# Patient Record
Sex: Female | Born: 1940 | Race: Black or African American | Hispanic: No | Marital: Married | Smoking: Former smoker
Health system: Southern US, Community
[De-identification: ages and names within clinical notes are randomized; demographics above are authoritative.]

## PROBLEM LIST (undated history)

## (undated) DIAGNOSIS — C799 Secondary malignant neoplasm of unspecified site: Secondary | ICD-10-CM

## (undated) DIAGNOSIS — N179 Acute kidney failure, unspecified: Secondary | ICD-10-CM

## (undated) DIAGNOSIS — I10 Essential (primary) hypertension: Secondary | ICD-10-CM

## (undated) DIAGNOSIS — C801 Malignant (primary) neoplasm, unspecified: Secondary | ICD-10-CM

## (undated) DIAGNOSIS — R601 Generalized edema: Secondary | ICD-10-CM

## (undated) DIAGNOSIS — E785 Hyperlipidemia, unspecified: Secondary | ICD-10-CM

## (undated) DIAGNOSIS — E119 Type 2 diabetes mellitus without complications: Secondary | ICD-10-CM

## (undated) HISTORY — PX: ABDOMINAL HYSTERECTOMY: SHX81

---

## 2015-08-06 DIAGNOSIS — E119 Type 2 diabetes mellitus without complications: Secondary | ICD-10-CM | POA: Diagnosis not present

## 2015-08-06 DIAGNOSIS — I10 Essential (primary) hypertension: Secondary | ICD-10-CM | POA: Diagnosis not present

## 2015-08-09 DIAGNOSIS — Z23 Encounter for immunization: Secondary | ICD-10-CM | POA: Diagnosis not present

## 2015-08-09 DIAGNOSIS — E119 Type 2 diabetes mellitus without complications: Secondary | ICD-10-CM | POA: Diagnosis not present

## 2015-08-09 DIAGNOSIS — I10 Essential (primary) hypertension: Secondary | ICD-10-CM | POA: Diagnosis not present

## 2015-12-04 DIAGNOSIS — E119 Type 2 diabetes mellitus without complications: Secondary | ICD-10-CM | POA: Diagnosis not present

## 2015-12-04 DIAGNOSIS — E785 Hyperlipidemia, unspecified: Secondary | ICD-10-CM | POA: Diagnosis not present

## 2015-12-04 DIAGNOSIS — I10 Essential (primary) hypertension: Secondary | ICD-10-CM | POA: Diagnosis not present

## 2015-12-10 DIAGNOSIS — E1169 Type 2 diabetes mellitus with other specified complication: Secondary | ICD-10-CM | POA: Diagnosis not present

## 2015-12-10 DIAGNOSIS — I10 Essential (primary) hypertension: Secondary | ICD-10-CM | POA: Diagnosis not present

## 2015-12-10 DIAGNOSIS — Z6838 Body mass index (BMI) 38.0-38.9, adult: Secondary | ICD-10-CM | POA: Diagnosis not present

## 2016-01-08 DIAGNOSIS — E1169 Type 2 diabetes mellitus with other specified complication: Secondary | ICD-10-CM | POA: Diagnosis not present

## 2016-01-08 DIAGNOSIS — I10 Essential (primary) hypertension: Secondary | ICD-10-CM | POA: Diagnosis not present

## 2016-01-08 DIAGNOSIS — Z780 Asymptomatic menopausal state: Secondary | ICD-10-CM | POA: Diagnosis not present

## 2016-01-15 DIAGNOSIS — Z1211 Encounter for screening for malignant neoplasm of colon: Secondary | ICD-10-CM | POA: Diagnosis not present

## 2016-01-17 DIAGNOSIS — E119 Type 2 diabetes mellitus without complications: Secondary | ICD-10-CM | POA: Diagnosis not present

## 2016-04-23 DIAGNOSIS — Z23 Encounter for immunization: Secondary | ICD-10-CM | POA: Diagnosis not present

## 2016-05-13 DIAGNOSIS — E785 Hyperlipidemia, unspecified: Secondary | ICD-10-CM | POA: Diagnosis not present

## 2016-05-13 DIAGNOSIS — I10 Essential (primary) hypertension: Secondary | ICD-10-CM | POA: Diagnosis not present

## 2016-05-13 DIAGNOSIS — E119 Type 2 diabetes mellitus without complications: Secondary | ICD-10-CM | POA: Diagnosis not present

## 2016-05-19 DIAGNOSIS — I10 Essential (primary) hypertension: Secondary | ICD-10-CM | POA: Diagnosis not present

## 2016-05-19 DIAGNOSIS — E119 Type 2 diabetes mellitus without complications: Secondary | ICD-10-CM | POA: Diagnosis not present

## 2016-08-14 ENCOUNTER — Observation Stay (HOSPITAL_COMMUNITY)
Admission: EM | Admit: 2016-08-14 | Discharge: 2016-08-16 | Disposition: A | Discharge status: Home / Self Care | Payer: Medicare HMO | Attending: Internal Medicine | Admitting: Internal Medicine

## 2016-08-14 ENCOUNTER — Encounter (HOSPITAL_COMMUNITY): Payer: Self-pay | Admitting: Emergency Medicine

## 2016-08-14 ENCOUNTER — Emergency Department (HOSPITAL_COMMUNITY): Payer: Medicare HMO

## 2016-08-14 DIAGNOSIS — F17200 Nicotine dependence, unspecified, uncomplicated: Secondary | ICD-10-CM | POA: Diagnosis not present

## 2016-08-14 DIAGNOSIS — R42 Dizziness and giddiness: Secondary | ICD-10-CM

## 2016-08-14 DIAGNOSIS — R404 Transient alteration of awareness: Secondary | ICD-10-CM | POA: Diagnosis not present

## 2016-08-14 DIAGNOSIS — E785 Hyperlipidemia, unspecified: Secondary | ICD-10-CM | POA: Insufficient documentation

## 2016-08-14 DIAGNOSIS — Z79899 Other long term (current) drug therapy: Secondary | ICD-10-CM | POA: Diagnosis not present

## 2016-08-14 DIAGNOSIS — R55 Syncope and collapse: Principal | ICD-10-CM | POA: Insufficient documentation

## 2016-08-14 DIAGNOSIS — K5900 Constipation, unspecified: Secondary | ICD-10-CM | POA: Diagnosis not present

## 2016-08-14 DIAGNOSIS — N179 Acute kidney failure, unspecified: Secondary | ICD-10-CM | POA: Diagnosis not present

## 2016-08-14 DIAGNOSIS — R296 Repeated falls: Secondary | ICD-10-CM | POA: Insufficient documentation

## 2016-08-14 DIAGNOSIS — E669 Obesity, unspecified: Secondary | ICD-10-CM | POA: Diagnosis not present

## 2016-08-14 DIAGNOSIS — I708 Atherosclerosis of other arteries: Secondary | ICD-10-CM | POA: Diagnosis not present

## 2016-08-14 DIAGNOSIS — E860 Dehydration: Secondary | ICD-10-CM | POA: Insufficient documentation

## 2016-08-14 DIAGNOSIS — E119 Type 2 diabetes mellitus without complications: Secondary | ICD-10-CM | POA: Diagnosis not present

## 2016-08-14 DIAGNOSIS — Z66 Do not resuscitate: Secondary | ICD-10-CM | POA: Insufficient documentation

## 2016-08-14 DIAGNOSIS — Z6841 Body Mass Index (BMI) 40.0 and over, adult: Secondary | ICD-10-CM | POA: Diagnosis not present

## 2016-08-14 DIAGNOSIS — Z7982 Long term (current) use of aspirin: Secondary | ICD-10-CM | POA: Diagnosis not present

## 2016-08-14 DIAGNOSIS — M545 Low back pain: Secondary | ICD-10-CM | POA: Diagnosis not present

## 2016-08-14 DIAGNOSIS — I10 Essential (primary) hypertension: Secondary | ICD-10-CM | POA: Diagnosis not present

## 2016-08-14 DIAGNOSIS — E876 Hypokalemia: Secondary | ICD-10-CM | POA: Insufficient documentation

## 2016-08-14 DIAGNOSIS — R51 Headache: Secondary | ICD-10-CM | POA: Diagnosis not present

## 2016-08-14 DIAGNOSIS — S3992XA Unspecified injury of lower back, initial encounter: Secondary | ICD-10-CM | POA: Diagnosis not present

## 2016-08-14 DIAGNOSIS — D72829 Elevated white blood cell count, unspecified: Secondary | ICD-10-CM | POA: Diagnosis not present

## 2016-08-14 HISTORY — DX: Hyperlipidemia, unspecified: E78.5

## 2016-08-14 HISTORY — DX: Type 2 diabetes mellitus without complications: E11.9

## 2016-08-14 LAB — CBG MONITORING, ED: Glucose-Capillary: 127 mg/dL — ABNORMAL HIGH (ref 65–99)

## 2016-08-14 LAB — BASIC METABOLIC PANEL
Anion gap: 10 (ref 5–15)
BUN: 39 mg/dL — AB (ref 6–20)
CO2: 25 mmol/L (ref 22–32)
Calcium: 9.3 mg/dL (ref 8.9–10.3)
Chloride: 99 mmol/L — ABNORMAL LOW (ref 101–111)
Creatinine, Ser: 1.79 mg/dL — ABNORMAL HIGH (ref 0.44–1.00)
GFR calc Af Amer: 31 mL/min — ABNORMAL LOW (ref >60)
GFR, EST NON AFRICAN AMERICAN: 26 mL/min — AB (ref >60)
Glucose, Bld: 135 mg/dL — ABNORMAL HIGH (ref 65–99)
POTASSIUM: 3.9 mmol/L (ref 3.5–5.1)
SODIUM: 134 mmol/L — AB (ref 135–145)

## 2016-08-14 LAB — CBC
HCT: 36.1 % (ref 36.0–46.0)
Hemoglobin: 12 g/dL (ref 12.0–15.0)
MCH: 29.7 pg (ref 26.0–34.0)
MCHC: 33.2 g/dL (ref 30.0–36.0)
MCV: 89.4 fL (ref 78.0–100.0)
Platelets: 365 10*3/uL (ref 150–400)
RBC: 4.04 MIL/uL (ref 3.87–5.11)
RDW: 12.9 % (ref 11.5–15.5)
WBC: 12.8 10*3/uL — AB (ref 4.0–10.5)

## 2016-08-14 LAB — I-STAT TROPONIN, ED: TROPONIN I, POC: 0.03 ng/mL (ref 0.00–0.08)

## 2016-08-14 MED ORDER — SODIUM CHLORIDE 0.9 % IV BOLUS (SEPSIS)
500.0000 mL | Freq: Once | INTRAVENOUS | Status: AC
  Administered 2016-08-14: 500 mL via INTRAVENOUS

## 2016-08-14 MED ORDER — SODIUM CHLORIDE 0.9 % IV SOLN
INTRAVENOUS | Status: DC
  Administered 2016-08-15: 01:00:00 via INTRAVENOUS

## 2016-08-14 NOTE — ED Notes (Signed)
Patient transported to CT 

## 2016-08-14 NOTE — ED Triage Notes (Addendum)
Per EMS pt complaint of dizziness since Sunday; 3 falls today related to ongoing dizziness. Pt past hx taking klonopin 2 weeks ago. Pt recently accidentally taking 1 tablet of klonopin instead of aspirin related to similar looks in pill. Pt complaint of tailbone and left frontal head pain post falls.

## 2016-08-14 NOTE — ED Notes (Signed)
EKG given to EDP,James, MD., for review. 

## 2016-08-14 NOTE — ED Provider Notes (Signed)
[facility] DEPT Provider Note   CSN: [id number] Arrival date & time: 08/14/16  1855     History   Chief Complaint Chief Complaint  Patient presents with  . Dizziness  . Fall    HPI Carrie Chaney is a 76 y.o. female.  Patient with h/o DM and HTN -- presents with complaint of multiple falls and dizziness today. Patient states that she had a "GI illness" starting 6 days ago. Patient had 1-2 days of vomiting, that improved, however patient notes significantly decreased oral intake since onset. Patient has had associated dizziness which she describes as disequilibrium. No sensation of spinning or syncope. Patient does feel lightheaded when she stands up. Patient notes that she discontinued Klonopin 2 weeks ago but accidentally took a dose at 11 AM today. She states that she also no longer wants to take clonidine, last dose this morning, she feels that this medication makes her particularly lightheaded. Patient fell 3 times today. She complains of tailbone pain and frontal headache. Patient states that she struck the front of her head twice. No blood thinners but does take aspirin twice a day. Patient denies chest pains, shortness of breath. No blood in her stool or urine. Patient denies signs of stroke including: facial droop, slurred speech, aphasia, weakness/numbness in extremities. No urinary symptoms. No easy bleeding, bruising, skin rashes. Onset of symptoms acute. Course is constant.      Past Medical History:  Diagnosis Date  . Diabetes mellitus without complication ([place])   . Hyperlipemia     There are no active problems to display for this patient.   Past Surgical History:  Procedure Laterality Date  . ABDOMINAL HYSTERECTOMY      OB History    No data available       Home Medications    Prior to Admission medications   Medication Sig Start Date End Date Taking? Authorizing Provider  amLODipine (NORVASC) 5 MG tablet Take 5 mg by mouth daily.   Yes Historical  Provider, MD  aspirin 81 MG EC tablet Take 162 mg by mouth daily. Swallow whole.   Yes Historical Provider, MD  atorvastatin (LIPITOR) 40 MG tablet Take 40 mg by mouth daily.   Yes Historical Provider, MD  cloNIDine (CATAPRES) 0.1 MG tablet Take 0.1 mg by mouth 2 (two) times daily.   Yes Historical Provider, MD  Lorcaserin HCl 10 MG TABS Take 10 mg by mouth 2 (two) times daily.   Yes Historical Provider, MD  losartan-hydrochlorothiazide (HYZAAR) 100-25 MG tablet Take 1 tablet by mouth daily.   Yes Historical Provider, MD  Multiple Vitamins-Minerals (WOMENS 50+ ADVANCED PO) Take 1 capsule by mouth daily.   Yes Historical Provider, MD  niacin (NIASPAN) 1000 MG CR tablet Take 1,000 mg by mouth at bedtime.   Yes Historical Provider, MD    Family History No family history on file.  Social History Social History  Substance Use Topics  . Smoking status: Current Every Day Smoker  . Smokeless tobacco: Never Used     Comment: one a day  . Alcohol use No     Allergies   Tomato   Review of Systems Review of Systems  Constitutional: Negative for fatigue and fever.  HENT: Negative for rhinorrhea, sore throat and tinnitus.   Eyes: Negative for photophobia, pain, redness and visual disturbance.  Respiratory: Negative for cough and shortness of breath.   Cardiovascular: Negative for chest pain.  Gastrointestinal: Positive for abdominal pain (upper, mild), nausea and vomiting. Negative for blood  in stool and diarrhea.  Genitourinary: Negative for dysuria.  Musculoskeletal: Negative for back pain, gait problem, myalgias and neck pain.  Skin: Negative for rash and wound.  Neurological: Positive for dizziness and headaches. Negative for speech difficulty, weakness, light-headedness and numbness.  Psychiatric/Behavioral: Negative for confusion and decreased concentration.     Physical Exam Updated Vital Signs BP (!) 103/33 (BP Location: Right Arm)   Pulse 62   Temp 98.3 F (36.8 C) (Oral)    Resp 16   SpO2 97%   Physical Exam  Constitutional: She is oriented to person, place, and time. She appears well-developed and well-nourished.  HENT:  Head: Normocephalic and atraumatic. Head is without raccoon's eyes and without Battle's sign.  Right Ear: Tympanic membrane, external ear and ear canal normal. No hemotympanum.  Left Ear: Tympanic membrane, external ear and ear canal normal. No hemotympanum.  Nose: Nose normal. No nasal septal hematoma.  Mouth/Throat: Uvula is midline, oropharynx is clear and moist and mucous membranes are normal.  No hematoma noted.   Eyes: Conjunctivae, EOM and lids are normal. Pupils are equal, round, and reactive to light. Right eye exhibits no nystagmus. Left eye exhibits no nystagmus.  No visible hyphema noted  Neck: Normal range of motion. Neck supple.  Cardiovascular: Normal rate and regular rhythm.   Pulmonary/Chest: Effort normal and breath sounds normal.  Abdominal: Soft. There is no tenderness.  Musculoskeletal:       Cervical back: She exhibits normal range of motion, no tenderness and no bony tenderness.       Thoracic back: She exhibits no tenderness and no bony tenderness.       Lumbar back: She exhibits tenderness (coccyx area tenderness). She exhibits no bony tenderness.  Neurological: She is alert and oriented to person, place, and time. She has normal strength and normal reflexes. No cranial nerve deficit or sensory deficit. Coordination normal. GCS eye subscore is 4. GCS verbal subscore is 5. GCS motor subscore is 6.  Patient with neg Romberg but is slightly unsteady. Normal finger-to-nose.   Skin: Skin is warm and dry.  Psychiatric: She has a normal mood and affect.  Nursing note and vitals reviewed.    ED Treatments / Results  Labs (all labs ordered are listed, but only abnormal results are displayed) Labs Reviewed  BASIC METABOLIC PANEL - Abnormal; Notable for the following:       Result Value   Sodium 134 (*)    Chloride  99 (*)    Glucose, Bld 135 (*)    BUN 39 (*)    Creatinine, Ser 1.79 (*)    GFR calc non Af Amer 26 (*)    GFR calc Af Amer 31 (*)    All other components within normal limits  CBC - Abnormal; Notable for the following:    WBC 12.8 (*)    All other components within normal limits  CBG MONITORING, ED - Abnormal; Notable for the following:    Glucose-Capillary 127 (*)    All other components within normal limits  URINALYSIS, ROUTINE W REFLEX MICROSCOPIC  I-STAT TROPOININ, ED    EKG  EKG Interpretation  Date/Time:  Friday August 14 2016 19:34:07 EST Ventricular Rate:  54 PR Interval:    QRS Duration: 108 QT Interval:  417 QTC Calculation: 396 R Axis:   47 Text Interpretation:  Sinus rhythm Minimal ST elevation, anterior leads Confirmed by Jeneen Rinks  MD, [place] ([id number]) on 08/14/2016 10:54:05 PM       Radiology Ct  Head Wo Contrast  Result Date: 08/14/2016 CLINICAL DATA:  Dizziness since 08/09/2016 with 3 falls today. Frontal head pain. EXAM: CT HEAD WITHOUT CONTRAST TECHNIQUE: Contiguous axial images were obtained from the base of the skull through the vertex without intravenous contrast. COMPARISON:  None. FINDINGS: Brain: Ventricles, cisterns and other CSF spaces are within normal. There is no mass, mass effect, shift of midline structures or acute hemorrhage. No evidence of acute infarction. Vascular: Calcified plaque over the cavernous segment of the internal carotid arteries. Skull: Within normal. Sinuses/Orbits: Within normal. Other: None. IMPRESSION: No acute intracranial findings. Electronically Signed   By: Marin Olp M.D.   On: 08/14/2016 21:20    Procedures Procedures (including critical care time)  Medications Ordered in ED Medications  sodium chloride 0.9 % bolus 500 mL (500 mLs Intravenous New Bag/Given 08/14/16 2333)  0.9 %  sodium chloride infusion (not administered)  sodium chloride 0.9 % bolus 500 mL (0 mLs Intravenous Stopped 08/14/16 2330)     Initial  Impression / Assessment and Plan / ED Course  I have reviewed the triage vital signs and the nursing notes.  Pertinent labs & imaging results that were available during my care of the patient were reviewed by me and considered in my medical decision making (see chart for details).    Patient seen and examined. Work-up initiated.   Vital signs reviewed and are as follows: BP (!) 103/33 (BP Location: Right Arm)   Pulse 62   Temp 98.3 F (36.8 C) (Oral)   Resp 16   SpO2 97%   Initial impression: bradycardic in 50's, no BB. No gross neuro deficits. Mld AKI, pt states no history, fluids ordered. Klonopin and clonidine likely contributing to symptoms today. Would d/c clonidine.   12:05 AM Patient discussed and seen by Dr. Zenia Resides. Will admit 2/2 lightheadedness, AKI.    12:45 AM Spoke with Dr. Roel Cluck. Will admit.   Final Clinical Impressions(s) / ED Diagnoses   Final diagnoses:  Acute kidney injury ([place])  Dehydration   Admit.   New Prescriptions New Prescriptions   No medications on file     Carlisle Cater, PA-C 08/15/16 [place], PA-C 08/15/16 [phone]

## 2016-08-14 NOTE — ED Notes (Signed)
Bed: WA04 Expected date:  Expected time:  Means of arrival:  Comments: EMS 76 yo female dizzy over the last several days with multiple falls

## 2016-08-14 NOTE — ED Provider Notes (Signed)
58 show female complains of dizziness and lightheadedness with falls 2 today. Has evidence of acute kidney injury on her be met. I didn't relate her department and she seems slightly off balance with prolonged ambulation. Head CT was negative. Will admit patient for observation as well as IV hydration for treatment of dehydration   Lacretia Leigh, MD 08/14/16 2354

## 2016-08-14 NOTE — ED Notes (Signed)
Made two attempts to get I-stat.  Will let tech or other RN try

## 2016-08-14 NOTE — ED Notes (Signed)
Attempted lab draw x 2 but unsuccessful. RN,Katie made aware.

## 2016-08-15 ENCOUNTER — Observation Stay (HOSPITAL_COMMUNITY): Payer: Medicare HMO

## 2016-08-15 ENCOUNTER — Emergency Department (HOSPITAL_COMMUNITY): Payer: Medicare HMO

## 2016-08-15 ENCOUNTER — Encounter (HOSPITAL_COMMUNITY): Payer: Self-pay | Admitting: Internal Medicine

## 2016-08-15 DIAGNOSIS — D72825 Bandemia: Secondary | ICD-10-CM

## 2016-08-15 DIAGNOSIS — E119 Type 2 diabetes mellitus without complications: Secondary | ICD-10-CM | POA: Diagnosis not present

## 2016-08-15 DIAGNOSIS — E876 Hypokalemia: Secondary | ICD-10-CM | POA: Diagnosis not present

## 2016-08-15 DIAGNOSIS — M545 Low back pain: Secondary | ICD-10-CM | POA: Diagnosis not present

## 2016-08-15 DIAGNOSIS — N179 Acute kidney failure, unspecified: Secondary | ICD-10-CM | POA: Diagnosis not present

## 2016-08-15 DIAGNOSIS — R55 Syncope and collapse: Secondary | ICD-10-CM

## 2016-08-15 DIAGNOSIS — D72829 Elevated white blood cell count, unspecified: Secondary | ICD-10-CM | POA: Diagnosis present

## 2016-08-15 DIAGNOSIS — I10 Essential (primary) hypertension: Secondary | ICD-10-CM | POA: Diagnosis not present

## 2016-08-15 DIAGNOSIS — R935 Abnormal findings on diagnostic imaging of other abdominal regions, including retroperitoneum: Secondary | ICD-10-CM | POA: Diagnosis not present

## 2016-08-15 DIAGNOSIS — S3992XA Unspecified injury of lower back, initial encounter: Secondary | ICD-10-CM | POA: Diagnosis not present

## 2016-08-15 DIAGNOSIS — R42 Dizziness and giddiness: Secondary | ICD-10-CM

## 2016-08-15 DIAGNOSIS — E860 Dehydration: Secondary | ICD-10-CM | POA: Diagnosis not present

## 2016-08-15 DIAGNOSIS — E785 Hyperlipidemia, unspecified: Secondary | ICD-10-CM | POA: Diagnosis not present

## 2016-08-15 DIAGNOSIS — R296 Repeated falls: Secondary | ICD-10-CM | POA: Diagnosis not present

## 2016-08-15 LAB — URINALYSIS, ROUTINE W REFLEX MICROSCOPIC
Bilirubin Urine: NEGATIVE
GLUCOSE, UA: NEGATIVE mg/dL
KETONES UR: 5 mg/dL — AB
NITRITE: NEGATIVE
PROTEIN: NEGATIVE mg/dL
Specific Gravity, Urine: 1.012 (ref 1.005–1.030)
pH: 5 (ref 5.0–8.0)

## 2016-08-15 LAB — COMPREHENSIVE METABOLIC PANEL
ALBUMIN: 3.2 g/dL — AB (ref 3.5–5.0)
ALK PHOS: 42 U/L (ref 38–126)
ALT: 13 U/L — AB (ref 14–54)
ANION GAP: 10 (ref 5–15)
AST: 13 U/L — AB (ref 15–41)
BILIRUBIN TOTAL: 1.1 mg/dL (ref 0.3–1.2)
BUN: 34 mg/dL — AB (ref 6–20)
CALCIUM: 8.9 mg/dL (ref 8.9–10.3)
CO2: 25 mmol/L (ref 22–32)
Chloride: 102 mmol/L (ref 101–111)
Creatinine, Ser: 1.32 mg/dL — ABNORMAL HIGH (ref 0.44–1.00)
GFR calc Af Amer: 44 mL/min — ABNORMAL LOW (ref >60)
GFR calc non Af Amer: 38 mL/min — ABNORMAL LOW (ref >60)
GLUCOSE: 95 mg/dL (ref 65–99)
Potassium: 2.7 mmol/L — CL (ref 3.5–5.1)
SODIUM: 137 mmol/L (ref 135–145)
TOTAL PROTEIN: 5.9 g/dL — AB (ref 6.5–8.1)

## 2016-08-15 LAB — GLUCOSE, CAPILLARY
GLUCOSE-CAPILLARY: 105 mg/dL — AB (ref 65–99)
GLUCOSE-CAPILLARY: 113 mg/dL — AB (ref 65–99)
Glucose-Capillary: 113 mg/dL — ABNORMAL HIGH (ref 65–99)
Glucose-Capillary: 121 mg/dL — ABNORMAL HIGH (ref 65–99)

## 2016-08-15 LAB — CBC
HCT: 33.3 % — ABNORMAL LOW (ref 36.0–46.0)
HEMOGLOBIN: 10.9 g/dL — AB (ref 12.0–15.0)
MCH: 29.2 pg (ref 26.0–34.0)
MCHC: 32.7 g/dL (ref 30.0–36.0)
MCV: 89.3 fL (ref 78.0–100.0)
Platelets: 347 10*3/uL (ref 150–400)
RBC: 3.73 MIL/uL — ABNORMAL LOW (ref 3.87–5.11)
RDW: 13.1 % (ref 11.5–15.5)
WBC: 9.5 10*3/uL (ref 4.0–10.5)

## 2016-08-15 LAB — BASIC METABOLIC PANEL
ANION GAP: 6 (ref 5–15)
BUN: 30 mg/dL — ABNORMAL HIGH (ref 6–20)
CALCIUM: 8.8 mg/dL — AB (ref 8.9–10.3)
CO2: 27 mmol/L (ref 22–32)
Chloride: 106 mmol/L (ref 101–111)
Creatinine, Ser: 1.04 mg/dL — ABNORMAL HIGH (ref 0.44–1.00)
GFR calc Af Amer: 59 mL/min — ABNORMAL LOW (ref >60)
GFR calc non Af Amer: 51 mL/min — ABNORMAL LOW (ref >60)
GLUCOSE: 116 mg/dL — AB (ref 65–99)
POTASSIUM: 3.8 mmol/L (ref 3.5–5.1)
Sodium: 139 mmol/L (ref 135–145)

## 2016-08-15 LAB — MAGNESIUM
MAGNESIUM: 1.5 mg/dL — AB (ref 1.7–2.4)
Magnesium: 2.1 mg/dL (ref 1.7–2.4)

## 2016-08-15 LAB — CREATININE, URINE, RANDOM: Creatinine, Urine: 146.96 mg/dL

## 2016-08-15 LAB — PHOSPHORUS: Phosphorus: 2.6 mg/dL (ref 2.5–4.6)

## 2016-08-15 LAB — SODIUM, URINE, RANDOM: Sodium, Ur: 36 mmol/L

## 2016-08-15 LAB — TSH: TSH: 1.289 u[IU]/mL (ref 0.350–4.500)

## 2016-08-15 MED ORDER — ATORVASTATIN CALCIUM 40 MG PO TABS
40.0000 mg | ORAL_TABLET | Freq: Every day | ORAL | Status: DC
  Administered 2016-08-15 – 2016-08-16 (×2): 40 mg via ORAL
  Filled 2016-08-15 (×2): qty 1

## 2016-08-15 MED ORDER — AMLODIPINE BESYLATE 5 MG PO TABS
5.0000 mg | ORAL_TABLET | Freq: Every day | ORAL | Status: DC
  Administered 2016-08-15 – 2016-08-16 (×2): 5 mg via ORAL
  Filled 2016-08-15 (×2): qty 1

## 2016-08-15 MED ORDER — NIACIN ER (ANTIHYPERLIPIDEMIC) 500 MG PO TBCR
1000.0000 mg | EXTENDED_RELEASE_TABLET | Freq: Every day | ORAL | Status: DC
  Administered 2016-08-15: 1000 mg via ORAL
  Filled 2016-08-15: qty 2

## 2016-08-15 MED ORDER — MAGNESIUM SULFATE 2 GM/50ML IV SOLN
2.0000 g | Freq: Once | INTRAVENOUS | Status: AC
Start: 2016-08-15 — End: 2016-08-15
  Administered 2016-08-15: 2 g via INTRAVENOUS
  Filled 2016-08-15: qty 50

## 2016-08-15 MED ORDER — SODIUM CHLORIDE 0.9% FLUSH
3.0000 mL | Freq: Two times a day (BID) | INTRAVENOUS | Status: DC
  Administered 2016-08-15 (×2): 3 mL via INTRAVENOUS

## 2016-08-15 MED ORDER — POTASSIUM CHLORIDE CRYS ER 20 MEQ PO TBCR
40.0000 meq | EXTENDED_RELEASE_TABLET | ORAL | Status: AC
  Administered 2016-08-15 (×2): 40 meq via ORAL
  Filled 2016-08-15 (×2): qty 2

## 2016-08-15 MED ORDER — SODIUM CHLORIDE 0.9 % IV BOLUS (SEPSIS)
500.0000 mL | Freq: Once | INTRAVENOUS | Status: AC
  Administered 2016-08-15: 500 mL via INTRAVENOUS

## 2016-08-15 MED ORDER — INSULIN ASPART 100 UNIT/ML ~~LOC~~ SOLN: 0.0000 [IU] | Freq: Every day | SUBCUTANEOUS | Status: DC

## 2016-08-15 MED ORDER — ASPIRIN EC 81 MG PO TBEC
162.0000 mg | DELAYED_RELEASE_TABLET | Freq: Every day | ORAL | Status: DC
  Administered 2016-08-15 – 2016-08-16 (×2): 162 mg via ORAL
  Filled 2016-08-15 (×2): qty 2

## 2016-08-15 MED ORDER — ACETAMINOPHEN 325 MG PO TABS
650.0000 mg | ORAL_TABLET | Freq: Four times a day (QID) | ORAL | Status: DC | PRN
  Administered 2016-08-16: 650 mg via ORAL
  Filled 2016-08-15: qty 2

## 2016-08-15 MED ORDER — ONDANSETRON HCL 4 MG/2ML IJ SOLN: 4.0000 mg | Freq: Four times a day (QID) | INTRAMUSCULAR | Status: DC | PRN

## 2016-08-15 MED ORDER — INSULIN ASPART 100 UNIT/ML ~~LOC~~ SOLN: 0.0000 [IU] | Freq: Three times a day (TID) | SUBCUTANEOUS | Status: DC

## 2016-08-15 MED ORDER — ONDANSETRON HCL 4 MG PO TABS: 4.0000 mg | ORAL_TABLET | Freq: Four times a day (QID) | ORAL | Status: DC | PRN

## 2016-08-15 MED ORDER — ACETAMINOPHEN 650 MG RE SUPP: 650.0000 mg | Freq: Four times a day (QID) | RECTAL | Status: DC | PRN

## 2016-08-15 MED ORDER — SODIUM CHLORIDE 0.9 % IV SOLN
INTRAVENOUS | Status: DC
  Administered 2016-08-15 – 2016-08-16 (×3): via INTRAVENOUS

## 2016-08-15 MED ORDER — HYDROCODONE-ACETAMINOPHEN 5-325 MG PO TABS
1.0000 | ORAL_TABLET | ORAL | Status: DC | PRN
  Administered 2016-08-15: 2 via ORAL
  Filled 2016-08-15: qty 2

## 2016-08-15 NOTE — H&P (Signed)
Carrie Chaney [id number] DOB: [birth year] DOA: 08/14/2016     PCP: Elyn Peers, MD   Outpatient Specialists: None Patient coming from:   home Lives alone,        Chief Complaint: Dizzy and falls  HPI: Carrie Chaney is a 76 y.o. female with medical history significant of DM 2 patient unsure if she has it,  HLD, HTN     Presented with 5 day history of dizziness and unstable gait. Patient have had 3 falls today secondary to ongoing dizziness. She states that usually taking clonidine makes her feel like that in today by her accident she took a number dose of clonidine instead of aspirin because a pill similar. She fell down and hit her tailbone as well as her forehead. Patient reports history of nausea vomiting   6 days ago and nausea vomiting has not improved but she hasn't eaten much or drink much fluids. She has no sensation of spinning she had no syncope. Just feels that she is unstable on her feet. She does feel lightheaded especially once that she tries to stand up. Patient is worried that clonidine that she takes her blood pressure also makes it feel lightheaded as well. She have felt like this in the past when she used to take clonidine. She reports constipation. Last BM was Sunday.   Denies facial droop no slurred speech no aphagia no localized weakness or  numbness that his chest pain shortness of breath.  Patient is known history of diabetes but reports well controlled has history of hypertension for which she takes home medications Patient reports that her primary care provider has started her on the medication to control her weight IN ER:  Temp (24hrs), Avg:98.3 F (36.8 C), Min:98.3 F (36.8 C), Max:98.3 F (36.8 C)      RR 16 satting 100% pulse 58 BP 136/95 Troponin 0.03 Sodium 134 K3.9 BUN 39 creatinine 1.79 no baseline available WBC 12.8 hemoglobin 12  Following Medications were ordered in ER: Medications  0.9 %  sodium chloride infusion (not administered)    sodium chloride 0.9 % bolus 500 mL (0 mLs Intravenous Stopped 08/14/16 2330)  sodium chloride 0.9 % bolus 500 mL (0 mLs Intravenous Stopped 08/15/16 0009)      Hospitalist was called for admission for Dehydration resulting in acute renal failure and unstable gait associated with accidental use of clonidine  Review of Systems:    Pertinent positives include:  nausea, vomiting,  constipation unstable gait, abdominal pain,  Constitutional:  No weight loss, night sweats, Fevers, chills, fatigue, weight loss  HEENT:  No headaches, Difficulty swallowing,Tooth/dental problems,Sore throat,  No sneezing, itching, ear ache, nasal congestion, post nasal drip,  Cardio-vascular:  No chest pain, Orthopnea, PND, anasarca, dizziness, palpitations.no Bilateral lower extremity swelling  GI:  No heartburn, indigestion, , change in bowel habits, loss of appetite, melena, blood in stool, hematemesis Resp:  no shortness of breath at rest. No dyspnea on exertion, No excess mucus, no productive cough, No non-productive cough, No coughing up of blood.No change in color of mucus.No wheezing. Skin:  no rash or lesions. No jaundice GU:  no dysuria, change in color of urine, no urgency or frequency. No straining to urinate.  No flank pain.  Musculoskeletal:  No joint pain or no joint swelling. No decreased range of motion. No back pain.  Psych:  No change in mood or affect. No depression or anxiety. No memory loss.  Neuro: no localizing neurological complaints, no tingling, no weakness,  no double vision,  , no slurred speech, no confusion  As per HPI otherwise 10 point review of systems negative.   Past Medical History: Past Medical History:  Diagnosis Date  . Diabetes mellitus without complication ([place])   . Hyperlipemia    Past Surgical History:  Procedure Laterality Date  . ABDOMINAL HYSTERECTOMY       Social History:  Ambulatory independently      reports that she has been smoking.  She has  never used smokeless tobacco. She reports that she does not drink alcohol or use drugs.  Allergies:   Allergies  Allergen Reactions  . Tomato     Breaks out gums       Family History:   Family History  Problem Relation Age of Onset  . Hypertension Mother   . Diabetes Other   . Cancer Neg Hx     Medications: Prior to Admission medications   Medication Sig Start Date End Date Taking? Authorizing Provider  amLODipine (NORVASC) 5 MG tablet Take 5 mg by mouth daily.   Yes Historical Provider, MD  aspirin 81 MG EC tablet Take 162 mg by mouth daily. Swallow whole.   Yes Historical Provider, MD  atorvastatin (LIPITOR) 40 MG tablet Take 40 mg by mouth daily.   Yes Historical Provider, MD  cloNIDine (CATAPRES) 0.1 MG tablet Take 0.1 mg by mouth 2 (two) times daily.   Yes Historical Provider, MD  Lorcaserin HCl 10 MG TABS Take 10 mg by mouth 2 (two) times daily.   Yes Historical Provider, MD  losartan-hydrochlorothiazide (HYZAAR) 100-25 MG tablet Take 1 tablet by mouth daily.   Yes Historical Provider, MD  Multiple Vitamins-Minerals (WOMENS 50+ ADVANCED PO) Take 1 capsule by mouth daily.   Yes Historical Provider, MD  niacin (NIASPAN) 1000 MG CR tablet Take 1,000 mg by mouth at bedtime.   Yes Historical Provider, MD    Physical Exam: Patient Vitals for the past 24 hrs:  BP Temp Temp src Pulse Resp SpO2  08/15/16 0000 136/95 - - (!) 58 16 100 %  08/14/16 2330 (!) 127/50 - - 60 12 99 %  08/14/16 2309 (!) 122/48 - - (!) 58 15 99 %  08/14/16 2108 (!) 120/44 - - (!) 58 18 99 %  08/14/16 1910 (!) 103/33 - - - - -  08/14/16 1909 (!) 94/35 98.3 F (36.8 C) Oral 62 16 97 %    1. General:  in No Acute distress 2. Psychological: Alert and  Oriented 3. Head/ENT:   Dry Mucous Membranes                          Head Non traumatic, neck supple                            Poor Dentition 4. SKIN:   decreased Skin turgor,  Skin clean Dry and intact no rash 5. Heart: Regular rate and rhythm no   Murmur, Rub or gallop 6. Lungs:  Clear to auscultation bilaterally, no wheezes or crackles   7. Abdomen: Soft,  non-tender,   Distended Bowel sounds noted 8. Lower extremities: no clubbing, cyanosis, or edema 9. Neurologically Grossly intact, moving all 4 extremities equally   10. MSK: Normal range of motion   body mass index is unknown because there is no height or weight on file.  Labs on Admission:   Labs on Admission: I have  personally reviewed following labs and imaging studies  CBC:  Recent Labs Lab 08/14/16 1940  WBC 12.8*  HGB 12.0  HCT 36.1  MCV 89.4  PLT 99991111   Basic Metabolic Panel:  Recent Labs Lab 08/14/16 1940  NA 134*  K 3.9  CL 99*  CO2 25  GLUCOSE 135*  BUN 39*  CREATININE 1.79*  CALCIUM 9.3   GFR: CrCl cannot be calculated (Unknown ideal weight.). Liver Function Tests: No results for input(s): AST, ALT, ALKPHOS, BILITOT, PROT, ALBUMIN in the last 168 hours. No results for input(s): LIPASE, AMYLASE in the last 168 hours. No results for input(s): AMMONIA in the last 168 hours. Coagulation Profile: No results for input(s): INR, PROTIME in the last 168 hours. Cardiac Enzymes: No results for input(s): CKTOTAL, CKMB, CKMBINDEX, TROPONINI in the last 168 hours. BNP (last 3 results) No results for input(s): PROBNP in the last 8760 hours. HbA1C: No results for input(s): HGBA1C in the last 72 hours. CBG:  Recent Labs Lab 08/14/16 1937  GLUCAP 127*   Lipid Profile: No results for input(s): CHOL, HDL, LDLCALC, TRIG, CHOLHDL, LDLDIRECT in the last 72 hours. Thyroid Function Tests: No results for input(s): TSH, T4TOTAL, FREET4, T3FREE, THYROIDAB in the last 72 hours. Anemia Panel: No results for input(s): VITAMINB12, FOLATE, FERRITIN, TIBC, IRON, RETICCTPCT in the last 72 hours. Urine analysis: No results found for: COLORURINE, APPEARANCEUR, LABSPEC, PHURINE, GLUCOSEU, HGBUR, BILIRUBINUR, KETONESUR, PROTEINUR, UROBILINOGEN, NITRITE,  LEUKOCYTESUR Sepsis Labs: @LABRCNTIP (procalcitonin:4,lacticidven:4) )No results found for this or any previous visit (from the past 240 hour(s)).     UA   ordered  No results found for: HGBA1C  CrCl cannot be calculated (Unknown ideal weight.).  BNP (last 3 results) No results for input(s): PROBNP in the last 8760 hours.   ECG REPORT  Independently reviewed Rate: 54  Rhythm: Sinus bradycardia ST&T Change: No acute ischemic changes   QTC 396  There were no vitals filed for this visit.   Cultures: No results found for: SDES, [place], CULT, REPTSTATUS   Radiological Exams on Admission: Ct Head Wo Contrast  Result Date: 08/14/2016 CLINICAL DATA:  Dizziness since 08/09/2016 with 3 falls today. Frontal head pain. EXAM: CT HEAD WITHOUT CONTRAST TECHNIQUE: Contiguous axial images were obtained from the base of the skull through the vertex without intravenous contrast. COMPARISON:  None. FINDINGS: Brain: Ventricles, cisterns and other CSF spaces are within normal. There is no mass, mass effect, shift of midline structures or acute hemorrhage. No evidence of acute infarction. Vascular: Calcified plaque over the cavernous segment of the internal carotid arteries. Skull: Within normal. Sinuses/Orbits: Within normal. Other: None. IMPRESSION: No acute intracranial findings. Electronically Signed   By: Marin Olp M.D.   On: 08/14/2016 21:20    Chart has been reviewed    Assessment/Plan   76 y.o. female with medical history significant of DM 2,  HLD, HTN  admitted for Dehydration resulting in acute renal failure and unstable gait associated with accidental use of clonidine   Present on Admission: . Dehydration will administer IV fluids check orthostatics prior to discharge.  . Acute renal failure (ARF) ([place]) likely secondary to dehydration will obtain urine electrolytes rehydrate and follow.  . Leukocytosis os of a hemoconcentration Will repeat in the morning and evaluate for  any source of infection . Hypertension -  hold clonidine given bradycardia likely cause of leg dizziness and lightheadedness. Check TSH. Hold ARB given acute renal failure. Lightheadedness with ambulation postural. 3 falls will have PT OT evaluate prior  to discharge Constipation will obtain KUB to evaluate for stool burden  Other plan as per orders.  DVT prophylaxis:  SCD    Code Status:    DNR/DNI   as per patient  Wishes her body to be donated to [facility] Communication:   Family  at  Bedside  plan of care was discussed with   Sister   Disposition Plan:    To home once workup is complete and patient is stable                         Would benefit from PT/OT eval prior to DC  ordered                                                Consults called:  none  Admission status:    obs   Level of care    tele           I have spent a total of 5min on this admission   Buel Molder 08/15/2016, 1:53 AM    [facility]  Pager [phone]   after 2 AM please page floor coverage PA If 7AM-7PM, please contact the day team taking care of the patient  [email or web address]  Password [email or web address]

## 2016-08-15 NOTE — Evaluation (Signed)
Physical Therapy One Time Evaluation Patient Details Name: Carrie Chaney MRN: [id number] DOB: 1941/05/22 Today's Date: 08/15/2016   History of Present Illness  76 year old married female with PMH of HTN, HLD, DM presented to the ED 08/14/16 due to dizziness, diagnosed with dehydration and acute kidney injury  Clinical Impression  Patient evaluated by Physical Therapy with no further acute PT needs identified. All education has been completed and the patient has no further questions.  Pt tolerated ambulation well and denies symptoms.  Pt reports feeling much better since admission and will likely d/c home tomorrow. See below for any follow-up Physical Therapy or equipment needs. PT is signing off. Thank you for this referral.     Follow Up Recommendations No PT follow up    Equipment Recommendations  None recommended by PT    Recommendations for Other Services       Precautions / Restrictions Precautions Precautions: None      Mobility  Bed Mobility Overal bed mobility: Modified Independent                Transfers Overall transfer level: Modified independent                  Ambulation/Gait Ambulation/Gait assistance: Supervision;Modified independent (Device/Increase time) Ambulation Distance (Feet): 200 Feet Assistive device: None Gait Pattern/deviations: WFL(Within Functional Limits)     General Gait Details: pt pushed IV pole, no unsteadiness or LOB observed, denies symptoms, reports feeling much better today  Stairs            Wheelchair Mobility    Modified Rankin (Stroke Patients Only)       Balance Overall balance assessment:  (only 2 falls prior to admission likely related to dehydration)                                           Pertinent Vitals/Pain Pain Assessment: 0-10 Pain Score: 4  Pain Location: "tailbone" Pain Descriptors / Indicators: Sore Pain Intervention(s): Monitored during session    Home  Living Family/patient expects to be discharged to:: Private residence Living Arrangements: Spouse/significant other           Home Layout: One level Home Equipment: None      Prior Function Level of Independence: Independent               Hand Dominance        Extremity/Trunk Assessment        Lower Extremity Assessment Lower Extremity Assessment: Overall WFL for tasks assessed    Cervical / Trunk Assessment Cervical / Trunk Assessment: Normal  Communication   Communication: No difficulties  Cognition Arousal/Alertness: Awake/alert Behavior During Therapy: WFL for tasks assessed/performed Overall Cognitive Status: Within Functional Limits for tasks assessed                      General Comments      Exercises     Assessment/Plan    PT Assessment Patent does not need any further PT services  PT Problem List            PT Treatment Interventions      PT Goals (Current goals can be found in the Care Plan section)  Acute Rehab PT Goals PT Goal Formulation: All assessment and education complete, DC therapy    Frequency     Barriers to discharge  Co-evaluation               End of Session   Activity Tolerance: Patient tolerated treatment well Patient left: in bed;with call bell/phone within reach;with family/visitor present;with bed alarm set      Functional Assessment Tool Used: clinical judgement Functional Limitation: Mobility: Walking and moving around Mobility: Walking and Moving Around Current Status [phone]): 0 percent impaired, limited or restricted Mobility: Walking and Moving Around Goal Status [phone]): 0 percent impaired, limited or restricted Mobility: Walking and Moving Around Discharge Status [id number]): 0 percent impaired, limited or restricted    Time: [id number] PT Time Calculation (min) (ACUTE ONLY): 10 min   Charges:   PT Evaluation $PT Eval Low Complexity: 1 Procedure     PT G Codes:   PT G-Codes  **NOT FOR INPATIENT CLASS** Functional Assessment Tool Used: clinical judgement Functional Limitation: Mobility: Walking and moving around Mobility: Walking and Moving Around Current Status [id number]): 0 percent impaired, limited or restricted Mobility: Walking and Moving Around Goal Status [id number]): 0 percent impaired, limited or restricted Mobility: Walking and Moving Around Discharge Status [id number]): 0 percent impaired, limited or restricted    Damonie Furney,KATHrine E 08/15/2016, 3:18 PM Carmelia Bake, PT, DPT 08/15/2016 Pager: [phone]

## 2016-08-15 NOTE — Progress Notes (Addendum)
CRITICAL VALUE ALERT  Critical value received:  Potassium 2.7  Date of notification:  08/15/16  Time of notification:  0725  Critical value read back:yes  Nurse who received alert:  Dellie Catholic

## 2016-08-15 NOTE — Progress Notes (Addendum)
PROGRESS NOTE  Carrie Chaney  [id number] DOB: 23-Sep-1940  DOA: 08/14/2016 PCP: Elyn Peers, MD   Brief Narrative:  76 year old married female, independent of activities of daily living, recently retired and travels a lot, PMH of HTN, HLD, ? DM presented to the ED 08/14/16 due to dizziness and falls 2. He gives history of feeling dizzy whenever she took clonidine, ongoing since May 2017. After discussing with her PCP, she reduced her clonidine to only at bedtime. She meant to stop taking the clonidine a week prior to this admission but inadvertently continued taking it missed taking the clonidine pill for aspirin that she takes along with Niaspan. 5 days prior to admission, she experienced 1 day of nausea, nonbloody emesis 4 and upper abdominal pain without diarrhea, fever or chills. She denies eating anything unusual or sick contacts with similar symptoms. Since then her appetite had decreased. Abdominal pain gradually decreased and resolved 3 days later. On 08/14/16, at approximately 11 AM, she got up from her bed and walk to the kitchen to try and fill her pitcher with ice water. She then felt dizzy, lightheaded, feeling like she was going to fall and then fell hitting the front of her head and her tailbone. She denied LOC or bleeding. She was weak and was not initially able to get up but subsequently with the help of a chair was able to get up, walk back to her bed. At approximately 1 PM, her sisters came to visit her and they sat and chatted for a couple of hours. When the weather ready to leave at approximately 3-4 PM, she walked them out to the door, had another similar episode where she felt dizzy, weak, fell down without LOC. Her sisters then called EMS and patient was brought to the hospital. She denies strokelike symptoms. Assessed with dehydration, acute kidney injury. Improving.     Assessment & Plan:   Active Problems:   Dehydration   Acute renal failure (ARF) (HCC)  Leukocytosis   Hypertension   Postural dizziness with presyncope   1. Self-limiting nausea, vomiting and abdominal pain: Occurred 5 days prior to admission. The symptoms have since resolved. Appetite is poor but tolerating diet. Diet as tolerated and monitor.? Acute viral GE. KUB report as below. Clinically without SBO or ileus. 2. Dehydration: Secondary to problem #1 and poor oral intake. IV fluids and encourage oral intake. 3. Acute kidney injury: Entered with creatinine of 1.79. Baseline creatinine not known. Secondary to dehydration, HCTZ and ACEI. Culprit medications held. IV fluids. Creatinine has improved to 1.32. Continue management and follow BMP closely. 4. Hypokalemia and hypomagnesemia: Replace and follow. 5. Anemia:? Dilutional. No reported bleeding. Follow CBCs. 6. Falls 2: No orthostatic blood pressure changes. Telemetry without significant arrhythmias. CT head without acute findings, lumbar spine x-ray without acute findings. 7. Essential hypertension: Controlled. DC clonidine. Continue amlodipine. Holding ARB and HCTZ due to acute kidney injury. 8. Hyperlipidemia: Continue statins and niacin. 9. Constipation: Bowel regimen. 10. ? Diabetes mellitus: Check A1c. Monitor CBGs-look good. DC SSI for now.   DVT prophylaxis: SCDs Code Status: DO NOT RESUSCITATE Family Communication: None at bedside Disposition Plan: DC home possibly 1/28.   Consultants:   None  Procedures:   None  Antimicrobials:   None    Subjective: Feels much better. No nausea, vomiting, abdominal pain, chest pain, dyspnea or palpitations reported. Denies dizziness or lightheadedness. Passing flatus. No BM since Monday   Objective:  Vitals:   08/15/16 0115 08/15/16 0200 08/15/16 0256 08/15/16  0500  BP:  138/95 (!) 117/55   Pulse: 68 (!) 59 62   Resp: 15 14 15    Temp:   99.6 F (37.6 C)   TempSrc:   Oral   SpO2: 99% 98% 99%   Weight:    (!) 184.2 kg (406 lb 1.4 oz)  Height:    5\' 5"   (1.651 m)    Intake/Output Summary (Last 24 hours) at 08/15/16 1338 Last data filed at 08/15/16 [id number]  Gross per 24 hour  Intake             1275 ml  Output                0 ml  Net             1275 ml   Filed Weights   08/15/16 0500  Weight: (!) 184.2 kg (406 lb 1.4 oz)    Examination:  General exam: Pleasant elderly female lying comfortably supine in bed. HEENT: Mild area of contusion and tenderness over right sided frontal scalp. Respiratory system: Clear to auscultation. Respiratory effort normal. Cardiovascular system: S1 & S2 heard, RRR. No JVD, murmurs, rubs, gallops or clicks. No pedal edema. Telemetry: Sinus bradycardia in the 50s-sinus rhythm in the 60s. Gastrointestinal system: Abdomen is nondistended, soft and nontender. No organomegaly or masses felt. Normal bowel sounds heard. Central nervous system: Alert and oriented. No focal neurological deficits. Extremities: Symmetric 5 x 5 power. Skin: No rashes, lesions or ulcers Psychiatry: Judgement and insight appear normal. Mood & affect appropriate.     Data Reviewed: I have personally reviewed following labs and imaging studies  CBC:  Recent Labs Lab 08/14/16 1940 08/15/16 0613  WBC 12.8* 9.5  HGB 12.0 10.9*  HCT 36.1 33.3*  MCV 89.4 89.3  PLT 365 [id number]   Basic Metabolic Panel:  Recent Labs Lab 08/14/16 1940 08/15/16 0613  NA 134* 137  K 3.9 2.7*  CL 99* 102  CO2 25 25  GLUCOSE 135* 95  BUN 39* 34*  CREATININE 1.79* 1.32*  CALCIUM 9.3 8.9  MG  --  1.5*  PHOS  --  2.6   GFR: Estimated Creatinine Clearance: 61.8 mL/min (by C-G formula based on SCr of 1.32 mg/dL (H)). Liver Function Tests:  Recent Labs Lab 08/15/16 0613  AST 13*  ALT 13*  ALKPHOS 42  BILITOT 1.1  PROT 5.9*  ALBUMIN 3.2*   No results for input(s): LIPASE, AMYLASE in the last 168 hours. No results for input(s): AMMONIA in the last 168 hours. Coagulation Profile: No results for input(s): INR, PROTIME in the last 168  hours. Cardiac Enzymes: No results for input(s): CKTOTAL, CKMB, CKMBINDEX, TROPONINI in the last 168 hours. BNP (last 3 results) No results for input(s): PROBNP in the last 8760 hours. HbA1C: No results for input(s): HGBA1C in the last 72 hours. CBG:  Recent Labs Lab 08/14/16 1937 08/15/16 0940 08/15/16 1156  GLUCAP 127* 105* 113*   Lipid Profile: No results for input(s): CHOL, HDL, LDLCALC, TRIG, CHOLHDL, LDLDIRECT in the last 72 hours. Thyroid Function Tests:  Recent Labs  08/15/16 0613  TSH 1.289   Anemia Panel: No results for input(s): VITAMINB12, FOLATE, FERRITIN, TIBC, IRON, RETICCTPCT in the last 72 hours.  Sepsis Labs:  Recent Labs Lab 08/14/16 1940 08/15/16 0613  WBC 12.8* 9.5    No results found for this or any previous visit (from the past 240 hour(s)).       Radiology Studies: Dg Lumbar Spine Complete  Result Date: 08/15/2016 CLINICAL DATA:  Initial evaluation for acute trauma, fall, back pain. EXAM: LUMBAR SPINE - COMPLETE 4+ VIEW COMPARISON:  None available. FINDINGS: Mild levoscoliosis. Vertebral bodies otherwise normally aligned with preservation of the normal lumbar lordosis. Vertebral body heights preserved. No evidence for acute fracture or subluxation. Visualized sacrum intact. Moderate to advanced degenerative spondylolysis at L3-4 and L4-5. Facet arthropathy present with the lower lumbar spine as well. Advanced aorto bi-iliac atherosclerotic disease. IMPRESSION: 1. No radiographic evidence for acute abnormality within the lumbar spine. 2. 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 3. Extensive aorto bi-iliac atherosclerotic disease. Electronically Signed   By: Jeannine Boga M.D.   On: 08/15/2016 00:48   Dg Abd 1 View  Result Date: 08/15/2016 CLINICAL DATA:  Obstipation. EXAM: ABDOMEN - 1 VIEW COMPARISON:  None. FINDINGS: Mild gaseous distention of small bowel loops predominantly in the left hemiabdomen. Gas and stool noted  within large bowel in the right hemiabdomen and pelvis. Findings are nonspecific as small bowel ileus, enteritis, partial SBO or early SBO could have a similar appearance. There is no free air. There is lower lumbar degenerative disc disease from L3 through S1. IMPRESSION: Nonspecific bowel gas pattern with mild gaseous distention of several loops of small intestine in the left hemiabdomen. Localized ileus, enteritis, or early small-bowel obstruction could have a similar appearance. Follow-up may prove useful. Electronically Signed   By: [name] M.D.   On: 08/15/2016 01:51   Ct Head Wo Contrast  Result Date: 08/14/2016 CLINICAL DATA:  Dizziness since 08/09/2016 with 3 falls today. Frontal head pain. EXAM: CT HEAD WITHOUT CONTRAST TECHNIQUE: Contiguous axial images were obtained from the base of the skull through the vertex without intravenous contrast. COMPARISON:  None. FINDINGS: Brain: Ventricles, cisterns and other CSF spaces are within normal. There is no mass, mass effect, shift of midline structures or acute hemorrhage. No evidence of acute infarction. Vascular: Calcified plaque over the cavernous segment of the internal carotid arteries. Skull: Within normal. Sinuses/Orbits: Within normal. Other: None. IMPRESSION: No acute intracranial findings. Electronically Signed   By: Marin Olp M.D.   On: 08/14/2016 21:20        Scheduled Meds: . amLODipine  5 mg Oral Daily  . aspirin EC  162 mg Oral Daily  . atorvastatin  40 mg Oral Daily  . insulin aspart  0-5 Units Subcutaneous QHS  . insulin aspart  0-9 Units Subcutaneous TID WC  . niacin  1,000 mg Oral QHS  . sodium chloride flush  3 mL Intravenous Q12H   Continuous Infusions: . sodium chloride 100 mL/hr at 08/15/16 0333     LOS: 0 days       [facility], MD [facility] Pager [phone]  If 7PM-7AM, please contact night-coverage [email or web address] Password [email or web address] 08/15/2016, 1:38 PM

## 2016-08-16 DIAGNOSIS — N179 Acute kidney failure, unspecified: Secondary | ICD-10-CM | POA: Diagnosis not present

## 2016-08-16 DIAGNOSIS — I10 Essential (primary) hypertension: Secondary | ICD-10-CM | POA: Diagnosis not present

## 2016-08-16 DIAGNOSIS — E860 Dehydration: Secondary | ICD-10-CM | POA: Diagnosis not present

## 2016-08-16 LAB — BASIC METABOLIC PANEL
Anion gap: 5 (ref 5–15)
BUN: 21 mg/dL — ABNORMAL HIGH (ref 6–20)
CHLORIDE: 110 mmol/L (ref 101–111)
CO2: 25 mmol/L (ref 22–32)
Calcium: 8.3 mg/dL — ABNORMAL LOW (ref 8.9–10.3)
Creatinine, Ser: 0.96 mg/dL (ref 0.44–1.00)
GFR calc non Af Amer: 56 mL/min — ABNORMAL LOW (ref >60)
Glucose, Bld: 118 mg/dL — ABNORMAL HIGH (ref 65–99)
POTASSIUM: 3.8 mmol/L (ref 3.5–5.1)
SODIUM: 140 mmol/L (ref 135–145)

## 2016-08-16 LAB — CBC
HCT: 32.3 % — ABNORMAL LOW (ref 36.0–46.0)
HEMOGLOBIN: 10.4 g/dL — AB (ref 12.0–15.0)
MCH: 29 pg (ref 26.0–34.0)
MCHC: 32.2 g/dL (ref 30.0–36.0)
MCV: 90 fL (ref 78.0–100.0)
Platelets: 331 10*3/uL (ref 150–400)
RBC: 3.59 MIL/uL — ABNORMAL LOW (ref 3.87–5.11)
RDW: 13.3 % (ref 11.5–15.5)
WBC: 8.7 10*3/uL (ref 4.0–10.5)

## 2016-08-16 LAB — GLUCOSE, CAPILLARY
Glucose-Capillary: 101 mg/dL — ABNORMAL HIGH (ref 65–99)
Glucose-Capillary: 125 mg/dL — ABNORMAL HIGH (ref 65–99)

## 2016-08-16 LAB — HEMOGLOBIN A1C
HEMOGLOBIN A1C: 5 % (ref 4.8–5.6)
MEAN PLASMA GLUCOSE: 97 mg/dL

## 2016-08-16 MED ORDER — SODIUM CHLORIDE 0.9 % IV BOLUS (SEPSIS)
500.0000 mL | Freq: Once | INTRAVENOUS | Status: AC
  Administered 2016-08-16: 500 mL via INTRAVENOUS

## 2016-08-16 MED ORDER — SIMETHICONE 80 MG PO CHEW
80.0000 mg | CHEWABLE_TABLET | Freq: Once | ORAL | Status: AC
  Administered 2016-08-16: 80 mg via ORAL
  Filled 2016-08-16: qty 1

## 2016-08-16 NOTE — Progress Notes (Signed)
Reviewed discharge information with patient and caregiver. Answered all questions. Patient/caregiver able to teach back medications and reasons to contact MD/911. Patient verbalizes importance of PCP follow up appointment.  Meridith Romick M. Carolann Brazell, RN  

## 2016-08-16 NOTE — Discharge Instructions (Signed)
Acute Kidney Injury, Adult Acute kidney injury (AKI) occurs when there is sudden (acute) damage to the kidneys.A small amount of kidney damage may not cause problems, but a large amount of damage may make it difficult or impossible for the kidneys to work the way they should. AKI may develop into long-lasting (chronic) kidney disease. Early detection and treatment of AKI may prevent kidney damage from becoming permanent or getting worse. What are the causes? Common causes of this condition include:  A problem with blood flow to the kidneys. This may be caused by:  Blood loss.  Heart and blood vessel (cardiovascular) disease.  Severe burns.  Liver disease.  Direct damage to the kidneys. This may be caused by:  Some medicines.  A kidney infection.  Poisoning.  Being around or in contact with poisonous (toxic) substances.  A surgical wound.  A hard, direct force to the kidney area.  A sudden blockage of urine flow. This may be caused by:  Cancer.  Kidney stones.  Enlarged prostate in males. What are the signs or symptoms? Symptoms develop slowly and may not be obvious until the kidney damage becomes severe. It is possible to have AKI for years without showing any symptoms. Symptoms of this condition can include:  Swelling (edema) of the face, legs, ankles, or feet.  Numbness, tingling, or loss of feeling (sensation) in the hands or feet.  Tiredness (lethargy).  Nausea or vomiting.  Confusion or trouble concentrating.  Problems with urination, such as:  Painful or burning feeling during urination.  Decreased urine production.  Frequent urination, especially at night.  Bloody urine.  Muscle twitches and cramps, especially in the legs.  Shortness of breath.  Weakness.  Constant itchiness.  Loss of appetite.  Metallic taste in the mouth.  Trouble sleeping.  Pale lining of the eyelids and surface of the eye (conjunctiva). How is this diagnosed? This  condition may be diagnosed with various tests. Tests may include:  Blood tests.  Urine tests.  Imaging tests.  A test in which a sample of tissue is removed from the kidneys to be looked at under a microscope (kidney biopsy). How is this treated? Treatment of AKIvaries depending on the cause and severity of the kidney damage. In mild cases, treatment may not be needed. The kidneys may heal on their own. If AKI is more severe, your health care provider will treat the cause of the kidney damage, help the kidneys heal, and prevent problems from occurring. Severe cases may require a procedure to remove toxic wastes from the body (dialysis) or surgery to repair kidney damage. Surgery may involve:  Repair of a torn kidney.  Removal of a urine flow obstruction. Follow these instructions at home:  Follow your prescribed diet.  Take over-the-counter and prescription medicines only as told by your health care provider.  Do not take any new medicines unless approved by your health care provider. Many medicines can worsen your kidney damage.  Do not take any vitamin and mineral supplements unless approved by your health care provider. Many nutritional supplements can worsen your kidney damage.  The dose of some medicines that you take may need to be adjusted.  Do not use any tobacco products, such as cigarettes, chewing tobacco, and e-cigarettes. If you need help quitting, ask your health care provider.  Keep all follow-up visits as told by your health care provider. This is important.  Keep track of your blood pressure. Report changes in your blood pressure as told by your  health care provider.  Achieve and maintain a healthy weight. If you need help with this, ask your health care provider.  Start or continue an exercise plan. Try to exercise at least 30 minutes a day, 5 days a week.  Stay current with immunizations as told by your health care provider. Where to find more  information:  American Association of Kidney Patients: BombTimer.gl  National Kidney Foundation: www.kidney.[place]: https://mathis.com/  [facility]: www.lifeoptions.org and www.kidneyschool.org Contact a health care provider if:  Your symptoms get worse.  You develop new symptoms. Get help right away if:  You develop symptoms of end-stage kidney disease, which include:  Headaches.  Abnormally dark or light skin.  Numbness in the hands or feet.  Easy bruising.  Frequent hiccups.  Chest pain.  Shortness of breath.  End of menstruation in women.  You have a fever.  You have decreased urine production.  You have pain or bleeding when you urinate. This information is not intended to replace advice given to you by your health care provider. Make sure you discuss any questions you have with your health care provider. Document Released: 01/19/2011 Document Revised: 02/13/2016 Document Reviewed: 03/04/2012 Elsevier Interactive Patient Education  2017 [place].   Additional discharge instructions:  Please get your medications reviewed and adjusted by your Primary MD.  Please request your Primary MD to go over all Hospital Tests and Procedure/Radiological results at the follow up, please get all Hospital records sent to your Prim MD by signing hospital release before you go home.  If you had Pneumonia of Lung problems at the Hospital: Please get a 2 view Chest X ray done in 6-8 weeks after hospital discharge or sooner if instructed by your Primary MD.  If you have Congestive Heart Failure: Please call your Cardiologist or Primary MD anytime you have any of the following symptoms:  1) 3 pound weight gain in 24 hours or 5 pounds in 1 week  2) shortness of breath, with or without a dry hacking cough  3) swelling in the hands, feet or stomach  4) if you have to sleep on extra pillows at night in order to breathe  Follow cardiac  low salt diet and 1.5 lit/day fluid restriction.  If you have diabetes Accuchecks 4 times/day, Once in AM empty stomach and then before each meal. Log in all results and show them to your primary doctor at your next visit. If any glucose reading is under 80 or above 300 call your primary MD immediately.  If you have Seizure/Convulsions/Epilepsy: Please do not drive, operate heavy machinery, participate in activities at heights or participate in high speed sports until you have seen by Primary MD or a Neurologist and advised to do so again.  If you had Gastrointestinal Bleeding: Please ask your Primary MD to check a complete blood count within one week of discharge or at your next visit. Your endoscopic/colonoscopic biopsies that are pending at the time of discharge, will also need to followed by your Primary MD.  Get Medicines reviewed and adjusted. Please take all your medications with you for your next visit with your Primary MD  Please request your Primary MD to go over all hospital tests and procedure/radiological results at the follow up, please ask your Primary MD to get all Hospital records sent to his/her office.  If you experience worsening of your admission symptoms, develop shortness of breath, life threatening emergency, suicidal or homicidal thoughts you must seek  medical attention immediately by calling 911 or calling your MD immediately  if symptoms less severe.  You must read complete instructions/literature along with all the possible adverse reactions/side effects for all the Medicines you take and that have been prescribed to you. Take any new Medicines after you have completely understood and accpet all the possible adverse reactions/side effects.   Do not drive or operate heavy machinery when taking Pain medications.   Do not take more than prescribed Pain, Sleep and Anxiety Medications  Special Instructions: If you have smoked or chewed Tobacco  in the last 2 yrs  please stop smoking, stop any regular Alcohol  and or any Recreational drug use.  Wear Seat belts while driving.  Please note You were cared for by a hospitalist during your hospital stay. If you have any questions about your discharge medications or the care you received while you were in the hospital after you are discharged, you can call the unit and asked to speak with the hospitalist on call if the hospitalist that took care of you is not available. Once you are discharged, your primary care physician will handle any further medical issues. Please note that NO REFILLS for any discharge medications will be authorized once you are discharged, as it is imperative that you return to your primary care physician (or establish a relationship with a primary care physician if you do not have one) for your aftercare needs so that they can reassess your need for medications and monitor your lab values.  You can reach the hospitalist office at phone [phone] or fax [phone]   If you do not have a primary care physician, you can call [phone] for a physician referral.

## 2016-08-16 NOTE — Care Management Obs Status (Signed)
[place] NOTIFICATION   Patient Details  Name: Carrie Chaney MRN: [id number] Date of Birth: 12-08-1940   Medicare Observation Status Notification Given:  Yes  RNCM explained [place] letter, notice given,  and patient declined to sign at this time.   Patient states she will follow up with MD regarding her questions about observation versus inpatient status order.   Serena Colonel, RN 08/16/2016, 11:54 AM

## 2016-08-16 NOTE — Discharge Summary (Addendum)
Physician Discharge Summary  Carrie Chaney [id number] DOB: 09/03/1940  PCP: Elyn Peers, MD  Admit date: 08/14/2016 Discharge date: 08/16/2016  Recommendations for Outpatient Follow-up:  1. Dr. Lucianne Lei, PCP in 5 days with repeat labs (CBC & BMP).  Home Health: None Equipment/Devices: None    Discharge Condition: Improved & stable.  CODE STATUS: DO NOT RESUSCITATE  Diet recommendation: Heart healthy diet.  Discharge Diagnoses:  Active Problems:   Dehydration   Acute renal failure (ARF) (HCC)   Leukocytosis   Hypertension   Postural dizziness with presyncope   Brief/Interim Summary: 76 year old married female, independent of activities of daily living, recently retired and travels a lot, PMH of HTN, HLD, ? DM presented to the ED 08/14/16 due to dizziness and falls 2. He gives history of feeling dizzy whenever she took clonidine, ongoing since May 2017. After discussing with her PCP, she reduced her clonidine to only at bedtime. She meant to stop taking the clonidine a week prior to this admission but inadvertently continued taking it mistaking the clonidine pill for aspirin that she takes along with Niaspan. 5 days prior to admission, she experienced 1 day of nausea, nonbloody emesis 4 and upper abdominal pain without diarrhea, fever or chills. She denies eating anything unusual or sick contacts with similar symptoms. Since then her appetite had decreased. Abdominal pain gradually decreased and resolved 3 days later. On 08/14/16, at approximately 11 AM, she got up from her bed and walk to the kitchen to try and fill her pitcher with ice water. She then felt dizzy, lightheaded, feeling like she was going to fall and then fell hitting the front of her head and her tailbone. She denied LOC or bleeding. She was weak and was not initially able to get up but subsequently with the help of a chair was able to get up, walk back to her bed. At approximately 1 PM, her sisters came to visit  her and they sat and chatted for a couple of hours. When the weather ready to leave at approximately 3-4 PM, she walked them out to the door, had another similar episode where she felt dizzy, weak, fell down without LOC. Her sisters then called EMS and patient was brought to the hospital. She denies strokelike symptoms. Assessed with dehydration, acute kidney injury.      Assessment & Plan:   1. Self-limiting nausea, vomiting and abdominal pain: Occurred 5 days prior to admission. The symptoms have since resolved. Appetite is improving. Tolerating diet.? Acute viral GE. KUB report as below. Clinically without SBO or ileus. 2. Dehydration: Secondary to problem #1 and poor oral intake. Resolved after IV fluids. 3. Acute kidney injury:  presented with creatinine of 1.79. Baseline creatinine not known. Secondary to dehydration, HCTZ and ACEI. Culprit medications held. IV fluids. Creatinine has normalized.  4. Hypokalemia and hypomagnesemia: Replaced. 5. Anemia:? Dilutional. No reported bleeding. Stable. 6. Falls 2: No orthostatic blood pressure changes. Telemetry without arrhythmias. CT head without acute findings, lumbar spine x-ray without acute findings. PT evaluated and do not have any recommendations at discharge. Complains of mild tailbone pain on trying to get up to stand. Advised supportive treatment with heat pack, acetaminophen OTC and OTC pain rubs. 7. Essential hypertension: Controlled. DC clonidine. Continue amlodipine. Due to recent acute kidney injury and reasonable blood pressure control, discontinued HCTZ and ARB at discharge. Follow-up with PCP in a few days for further management. 8. Hyperlipidemia: Continue statins and niacin. 9. Constipation: Bowel regimen. Passing flatus. 10. ? Diabetes  mellitus:  A1c: 5 11. Extensive aorto bi-iliac atherosclerotic disease: Seen on lumbar spine x-ray. Continue aspirin and statins. 12. Obesity: Patient states that she has lost 80 pounds over  the last 4 years. Advised her to discuss with her PCP regarding Lorcaserin which can cause dizziness. 13. Dizziness:? Related to dehydration. Negative orthostatics. CT head negative. No focal neurological deficits. Resolved.   Consultants:   None  Procedures:   None   Discharge Instructions  Discharge Instructions    Call MD for:  extreme fatigue    Complete by:  As directed    Call MD for:  persistant dizziness or light-headedness    Complete by:  As directed    Call MD for:  persistant nausea and vomiting    Complete by:  As directed    Call MD for:  severe uncontrolled pain    Complete by:  As directed    Diet - low sodium heart healthy    Complete by:  As directed    Increase activity slowly    Complete by:  As directed        Medication List    STOP taking these medications   losartan-hydrochlorothiazide 100-25 MG tablet Commonly known as:  HYZAAR     TAKE these medications   amLODipine 5 MG tablet Commonly known as:  NORVASC Take 5 mg by mouth daily.   aspirin 81 MG EC tablet Take 162 mg by mouth daily. Swallow whole.   atorvastatin 40 MG tablet Commonly known as:  LIPITOR Take 40 mg by mouth daily.   Lorcaserin HCl 10 MG Tabs Take 10 mg by mouth 2 (two) times daily.   niacin 1000 MG CR tablet Commonly known as:  NIASPAN Take 1,000 mg by mouth at bedtime.   WOMENS 50+ ADVANCED PO Take 1 capsule by mouth daily.      Follow-up Information    Elyn Peers, MD. Schedule an appointment as soon as possible for a visit in 5 day(s).   Specialty:  Family Medicine Why:  To be seen with repeat labs (CBC & BMP). Contact information: [address] [place] [address] [phone]          Allergies  Allergen Reactions  . Tomato     Breaks out gums       Procedures/Studies: Dg Lumbar Spine Complete  Result Date: 08/15/2016 CLINICAL DATA:  Initial evaluation for acute trauma, fall, back pain. EXAM: LUMBAR SPINE - COMPLETE 4+  VIEW COMPARISON:  None available. FINDINGS: Mild levoscoliosis. Vertebral bodies otherwise normally aligned with preservation of the normal lumbar lordosis. Vertebral body heights preserved. No evidence for acute fracture or subluxation. Visualized sacrum intact. Moderate to advanced degenerative spondylolysis at L3-4 and L4-5. Facet arthropathy present with the lower lumbar spine as well. Advanced aorto bi-iliac atherosclerotic disease. IMPRESSION: 1. No radiographic evidence for acute abnormality within the lumbar spine. 2. 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 3. Extensive aorto bi-iliac atherosclerotic disease. Electronically Signed   By: Jeannine Boga M.D.   On: 08/15/2016 00:48   Dg Abd 1 View  Result Date: 08/15/2016 CLINICAL DATA:  Obstipation. EXAM: ABDOMEN - 1 VIEW COMPARISON:  None. FINDINGS: Mild gaseous distention of small bowel loops predominantly in the left hemiabdomen. Gas and stool noted within large bowel in the right hemiabdomen and pelvis. Findings are nonspecific as small bowel ileus, enteritis, partial SBO or early SBO could have a similar appearance. There is no free air. There is lower lumbar degenerative  disc disease from L3 through S1. IMPRESSION: Nonspecific bowel gas pattern with mild gaseous distention of several loops of small intestine in the left hemiabdomen. Localized ileus, enteritis, or early small-bowel obstruction could have a similar appearance. Follow-up may prove useful. Electronically Signed   By: [name] M.D.   On: 08/15/2016 01:51   Ct Head Wo Contrast  Result Date: 08/14/2016 CLINICAL DATA:  Dizziness since 08/09/2016 with 3 falls today. Frontal head pain. EXAM: CT HEAD WITHOUT CONTRAST TECHNIQUE: Contiguous axial images were obtained from the base of the skull through the vertex without intravenous contrast. COMPARISON:  None. FINDINGS: Brain: Ventricles, cisterns and other CSF spaces are within normal. There is no mass, mass effect,  shift of midline structures or acute hemorrhage. No evidence of acute infarction. Vascular: Calcified plaque over the cavernous segment of the internal carotid arteries. Skull: Within normal. Sinuses/Orbits: Within normal. Other: None. IMPRESSION: No acute intracranial findings. Electronically Signed   By: Marin Olp M.D.   On: 08/14/2016 21:20      Subjective: Patient states that she feels significantly better compared to admission. Feels stronger. Denies dizziness, lightheadedness, chest pain, palpitations or dyspnea even with ambulation. No nausea, vomiting, abdominal pain. Tolerating diet. Passing lots of flatus but no BM. Mild tailbone pain when she is trying to get up from bed.  Discharge Exam:  Vitals:   08/15/16 2345 08/16/16 0125 08/16/16 0145 08/16/16 0330  BP: (!) 122/40 (!) 129/33 (!) 120/40 (!) 142/55  Pulse:  (!) 58  63  Resp:  16  16  Temp:  98.5 F (36.9 C)  98.5 F (36.9 C)  TempSrc:  Oral  Oral  SpO2:  99%  97%  Weight:      Height:        General exam: Pleasant elderly female lying comfortably supine in bed. HEENT: Mild area of contusion and tenderness over right sided frontal scalp. Respiratory system: Clear to auscultation. Respiratory effort normal. Cardiovascular system: S1 & S2 heard, RRR. No JVD, murmurs, rubs, gallops or clicks. No pedal edema. Telemetry: Sinus bradycardia in the 50s-sinus rhythm in the 60s. Gastrointestinal system: Abdomen is nondistended, soft and nontender. No organomegaly or masses felt. Normal bowel sounds heard. Central nervous system: Alert and oriented. No focal neurological deficits. Extremities: Symmetric 5 x 5 power. Symmetric pulses. Skin: No rashes, lesions or ulcers Psychiatry: Judgement and insight appear normal. Mood & affect appropriate.     The results of significant diagnostics from this hospitalization (including imaging, microbiology, ancillary and laboratory) are listed below for reference.      Microbiology: No results found for this or any previous visit (from the past 240 hour(s)).   Labs:  Basic Metabolic Panel:  Recent Labs Lab 08/14/16 1940 08/15/16 0613 08/15/16 1651 08/16/16 0527  NA 134* 137 139 140  K 3.9 2.7* 3.8 3.8  CL 99* 102 106 110  CO2 25 25 27 25   GLUCOSE 135* 95 116* 118*  BUN 39* 34* 30* 21*  CREATININE 1.79* 1.32* 1.04* 0.96  CALCIUM 9.3 8.9 8.8* 8.3*  MG  --  1.5* 2.1  --   PHOS  --  2.6  --   --    Liver Function Tests:  Recent Labs Lab 08/15/16 0613  AST 13*  ALT 13*  ALKPHOS 42  BILITOT 1.1  PROT 5.9*  ALBUMIN 3.2*   No results for input(s): LIPASE, AMYLASE in the last 168 hours. No results for input(s): AMMONIA in the last 168 hours. CBC:  Recent  Labs Lab 08/14/16 1940 08/15/16 0613 08/16/16 0527  WBC 12.8* 9.5 8.7  HGB 12.0 10.9* 10.4*  HCT 36.1 33.3* 32.3*  MCV 89.4 89.3 90.0  PLT 365 347 331   CBG:  Recent Labs Lab 08/15/16 1156 08/15/16 1740 08/15/16 2203 08/16/16 0756 08/16/16 1153  GLUCAP 113* 113* 121* 101* 125*   Hgb A1c  Recent Labs  08/15/16 0613  HGBA1C 5.0   Thyroid function studies  Recent Labs  08/15/16 0613  TSH 1.289   Urinalysis    Component Value Date/Time   COLORURINE YELLOW 08/15/2016 0015   APPEARANCEUR CLEAR 08/15/2016 0015   LABSPEC 1.012 08/15/2016 0015   PHURINE 5.0 08/15/2016 0015   GLUCOSEU NEGATIVE 08/15/2016 0015   HGBUR SMALL (A) 08/15/2016 0015   BILIRUBINUR NEGATIVE 08/15/2016 0015   KETONESUR 5 (A) 08/15/2016 0015   PROTEINUR NEGATIVE 08/15/2016 0015   NITRITE NEGATIVE 08/15/2016 0015   LEUKOCYTESUR LARGE (A) 08/15/2016 0015      Time coordinating discharge: Over 30 minutes  SIGNED:  Vernell Leep, MD, FACP, FHM. [facility] Pager [phone]  If 7PM-7AM, please contact night-coverage [email or web address] Password [email or web address] 08/16/2016, 1:08 PM

## 2016-08-16 NOTE — Care Management Note (Signed)
Case Management Note  Patient Details  Name: Carrie Chaney MRN: [id number] Date of Birth: Jul 12, 1941  Subjective/Objective:                  Presented with 5 day history of dizziness and unstable gait. Patient have had 3 falls day of ED visit,  secondary to ongoing dizziness.  Action/Plan: RNCM spoke with patient, states is ready for discharge today, after MD addresses her questions.  No care management needs at this tim.   Expected Discharge Date:  08/16/16             Expected Discharge Plan:  Home/Self Care  In-House Referral:  NA  Discharge planning Services  CM Consult  Post Acute Care Choice:  NA Choice offered to:     DME Arranged:    DME Agency:     HH Arranged:    HH Agency:     Status of Service:  Completed, signed off.  If discussed at [place] of Stay Meetings, dates discussed:    Additional Comments:  Serena Colonel, RN 08/16/2016, 11:59 AM

## 2016-08-19 DIAGNOSIS — E1169 Type 2 diabetes mellitus with other specified complication: Secondary | ICD-10-CM | POA: Diagnosis not present

## 2016-08-19 DIAGNOSIS — I10 Essential (primary) hypertension: Secondary | ICD-10-CM | POA: Diagnosis not present

## 2016-08-19 DIAGNOSIS — E860 Dehydration: Secondary | ICD-10-CM | POA: Diagnosis not present

## 2016-08-31 DIAGNOSIS — E119 Type 2 diabetes mellitus without complications: Secondary | ICD-10-CM | POA: Diagnosis not present

## 2016-08-31 DIAGNOSIS — I10 Essential (primary) hypertension: Secondary | ICD-10-CM | POA: Diagnosis not present

## 2016-09-02 DIAGNOSIS — I10 Essential (primary) hypertension: Secondary | ICD-10-CM | POA: Diagnosis not present

## 2016-09-02 DIAGNOSIS — E119 Type 2 diabetes mellitus without complications: Secondary | ICD-10-CM | POA: Diagnosis not present

## 2016-10-02 DIAGNOSIS — I10 Essential (primary) hypertension: Secondary | ICD-10-CM | POA: Diagnosis not present

## 2016-11-13 DIAGNOSIS — Z0000 Encounter for general adult medical examination without abnormal findings: Secondary | ICD-10-CM | POA: Diagnosis not present

## 2016-11-13 DIAGNOSIS — I10 Essential (primary) hypertension: Secondary | ICD-10-CM | POA: Diagnosis not present

## 2016-11-13 DIAGNOSIS — E1169 Type 2 diabetes mellitus with other specified complication: Secondary | ICD-10-CM | POA: Diagnosis not present

## 2016-12-04 DIAGNOSIS — E669 Obesity, unspecified: Secondary | ICD-10-CM | POA: Diagnosis not present

## 2016-12-04 DIAGNOSIS — I10 Essential (primary) hypertension: Secondary | ICD-10-CM | POA: Diagnosis not present

## 2016-12-04 DIAGNOSIS — Z6833 Body mass index (BMI) 33.0-33.9, adult: Secondary | ICD-10-CM | POA: Diagnosis not present

## 2017-01-13 DIAGNOSIS — I10 Essential (primary) hypertension: Secondary | ICD-10-CM | POA: Diagnosis not present

## 2017-01-13 DIAGNOSIS — E669 Obesity, unspecified: Secondary | ICD-10-CM | POA: Diagnosis not present

## 2017-01-13 DIAGNOSIS — E1169 Type 2 diabetes mellitus with other specified complication: Secondary | ICD-10-CM | POA: Diagnosis not present

## 2017-01-15 DIAGNOSIS — I10 Essential (primary) hypertension: Secondary | ICD-10-CM | POA: Diagnosis not present

## 2017-01-15 DIAGNOSIS — E669 Obesity, unspecified: Secondary | ICD-10-CM | POA: Diagnosis not present

## 2017-04-16 DIAGNOSIS — E669 Obesity, unspecified: Secondary | ICD-10-CM | POA: Diagnosis not present

## 2017-04-16 DIAGNOSIS — I10 Essential (primary) hypertension: Secondary | ICD-10-CM | POA: Diagnosis not present

## 2017-04-16 DIAGNOSIS — E1169 Type 2 diabetes mellitus with other specified complication: Secondary | ICD-10-CM | POA: Diagnosis not present

## 2017-04-20 DIAGNOSIS — I10 Essential (primary) hypertension: Secondary | ICD-10-CM | POA: Diagnosis not present

## 2017-04-20 DIAGNOSIS — E1169 Type 2 diabetes mellitus with other specified complication: Secondary | ICD-10-CM | POA: Diagnosis not present

## 2017-04-20 DIAGNOSIS — E669 Obesity, unspecified: Secondary | ICD-10-CM | POA: Diagnosis not present

## 2017-04-20 DIAGNOSIS — Z23 Encounter for immunization: Secondary | ICD-10-CM | POA: Diagnosis not present

## 2017-08-17 DIAGNOSIS — I10 Essential (primary) hypertension: Secondary | ICD-10-CM | POA: Diagnosis not present

## 2017-08-17 DIAGNOSIS — E1169 Type 2 diabetes mellitus with other specified complication: Secondary | ICD-10-CM | POA: Diagnosis not present

## 2017-08-20 DIAGNOSIS — I10 Essential (primary) hypertension: Secondary | ICD-10-CM | POA: Diagnosis not present

## 2017-09-29 DIAGNOSIS — R69 Illness, unspecified: Secondary | ICD-10-CM | POA: Diagnosis not present

## 2017-10-19 DIAGNOSIS — R69 Illness, unspecified: Secondary | ICD-10-CM | POA: Diagnosis not present

## 2017-11-16 DIAGNOSIS — E1169 Type 2 diabetes mellitus with other specified complication: Secondary | ICD-10-CM | POA: Diagnosis not present

## 2017-11-16 DIAGNOSIS — E785 Hyperlipidemia, unspecified: Secondary | ICD-10-CM | POA: Diagnosis not present

## 2017-11-16 DIAGNOSIS — E119 Type 2 diabetes mellitus without complications: Secondary | ICD-10-CM | POA: Diagnosis not present

## 2017-11-16 DIAGNOSIS — I10 Essential (primary) hypertension: Secondary | ICD-10-CM | POA: Diagnosis not present

## 2017-11-16 DIAGNOSIS — E669 Obesity, unspecified: Secondary | ICD-10-CM | POA: Diagnosis not present

## 2017-11-19 DIAGNOSIS — I10 Essential (primary) hypertension: Secondary | ICD-10-CM | POA: Diagnosis not present

## 2017-11-19 DIAGNOSIS — E1169 Type 2 diabetes mellitus with other specified complication: Secondary | ICD-10-CM | POA: Diagnosis not present

## 2017-11-19 DIAGNOSIS — E669 Obesity, unspecified: Secondary | ICD-10-CM | POA: Diagnosis not present

## 2017-11-24 DIAGNOSIS — Z1231 Encounter for screening mammogram for malignant neoplasm of breast: Secondary | ICD-10-CM | POA: Diagnosis not present

## 2017-12-02 DIAGNOSIS — R921 Mammographic calcification found on diagnostic imaging of breast: Secondary | ICD-10-CM | POA: Diagnosis not present

## 2017-12-07 ENCOUNTER — Other Ambulatory Visit: Payer: Self-pay | Admitting: Radiology

## 2017-12-07 DIAGNOSIS — Z01818 Encounter for other preprocedural examination: Secondary | ICD-10-CM | POA: Diagnosis not present

## 2017-12-07 DIAGNOSIS — N6489 Other specified disorders of breast: Secondary | ICD-10-CM | POA: Diagnosis not present

## 2017-12-07 DIAGNOSIS — R921 Mammographic calcification found on diagnostic imaging of breast: Secondary | ICD-10-CM | POA: Diagnosis not present

## 2017-12-07 DIAGNOSIS — R591 Generalized enlarged lymph nodes: Secondary | ICD-10-CM | POA: Diagnosis not present

## 2017-12-09 DIAGNOSIS — H40023 Open angle with borderline findings, high risk, bilateral: Secondary | ICD-10-CM | POA: Diagnosis not present

## 2017-12-09 DIAGNOSIS — Z0100 Encounter for examination of eyes and vision without abnormal findings: Secondary | ICD-10-CM | POA: Diagnosis not present

## 2017-12-09 DIAGNOSIS — H25813 Combined forms of age-related cataract, bilateral: Secondary | ICD-10-CM | POA: Diagnosis not present

## 2017-12-09 DIAGNOSIS — E119 Type 2 diabetes mellitus without complications: Secondary | ICD-10-CM | POA: Diagnosis not present

## 2017-12-21 DIAGNOSIS — Z1211 Encounter for screening for malignant neoplasm of colon: Secondary | ICD-10-CM | POA: Diagnosis not present

## 2017-12-21 DIAGNOSIS — Z1212 Encounter for screening for malignant neoplasm of rectum: Secondary | ICD-10-CM | POA: Diagnosis not present

## 2018-01-12 DIAGNOSIS — E119 Type 2 diabetes mellitus without complications: Secondary | ICD-10-CM | POA: Diagnosis not present

## 2018-01-12 DIAGNOSIS — H43813 Vitreous degeneration, bilateral: Secondary | ICD-10-CM | POA: Diagnosis not present

## 2018-01-12 DIAGNOSIS — H40023 Open angle with borderline findings, high risk, bilateral: Secondary | ICD-10-CM | POA: Diagnosis not present

## 2018-03-02 DIAGNOSIS — R69 Illness, unspecified: Secondary | ICD-10-CM | POA: Diagnosis not present

## 2018-03-22 DIAGNOSIS — E119 Type 2 diabetes mellitus without complications: Secondary | ICD-10-CM | POA: Diagnosis not present

## 2018-03-22 DIAGNOSIS — E1169 Type 2 diabetes mellitus with other specified complication: Secondary | ICD-10-CM | POA: Diagnosis not present

## 2018-03-22 DIAGNOSIS — E785 Hyperlipidemia, unspecified: Secondary | ICD-10-CM | POA: Diagnosis not present

## 2018-03-22 DIAGNOSIS — I10 Essential (primary) hypertension: Secondary | ICD-10-CM | POA: Diagnosis not present

## 2018-03-22 DIAGNOSIS — E669 Obesity, unspecified: Secondary | ICD-10-CM | POA: Diagnosis not present

## 2018-03-24 DIAGNOSIS — R69 Illness, unspecified: Secondary | ICD-10-CM | POA: Diagnosis not present

## 2018-03-25 DIAGNOSIS — M130 Polyarthritis, unspecified: Secondary | ICD-10-CM | POA: Diagnosis not present

## 2018-03-25 DIAGNOSIS — E669 Obesity, unspecified: Secondary | ICD-10-CM | POA: Diagnosis not present

## 2018-03-25 DIAGNOSIS — E119 Type 2 diabetes mellitus without complications: Secondary | ICD-10-CM | POA: Diagnosis not present

## 2018-03-30 DIAGNOSIS — M25511 Pain in right shoulder: Secondary | ICD-10-CM | POA: Diagnosis not present

## 2018-03-31 DIAGNOSIS — R69 Illness, unspecified: Secondary | ICD-10-CM | POA: Diagnosis not present

## 2018-04-06 DIAGNOSIS — H401132 Primary open-angle glaucoma, bilateral, moderate stage: Secondary | ICD-10-CM | POA: Diagnosis not present

## 2018-04-06 DIAGNOSIS — E119 Type 2 diabetes mellitus without complications: Secondary | ICD-10-CM | POA: Diagnosis not present

## 2018-04-06 DIAGNOSIS — H43813 Vitreous degeneration, bilateral: Secondary | ICD-10-CM | POA: Diagnosis not present

## 2018-04-27 DIAGNOSIS — H43813 Vitreous degeneration, bilateral: Secondary | ICD-10-CM | POA: Diagnosis not present

## 2018-04-27 DIAGNOSIS — E119 Type 2 diabetes mellitus without complications: Secondary | ICD-10-CM | POA: Diagnosis not present

## 2018-04-27 DIAGNOSIS — H40023 Open angle with borderline findings, high risk, bilateral: Secondary | ICD-10-CM | POA: Diagnosis not present

## 2018-05-25 DIAGNOSIS — R69 Illness, unspecified: Secondary | ICD-10-CM | POA: Diagnosis not present

## 2018-09-07 ENCOUNTER — Inpatient Hospital Stay (HOSPITAL_COMMUNITY)
Admission: EM | Admit: 2018-09-07 | Discharge: 2018-09-20 | DRG: 292 | Disposition: A | Discharge status: Skilled Nursing Facility | Payer: Medicare Other | Attending: Internal Medicine | Admitting: Internal Medicine

## 2018-09-07 ENCOUNTER — Other Ambulatory Visit: Payer: Self-pay

## 2018-09-07 ENCOUNTER — Emergency Department (HOSPITAL_COMMUNITY): Payer: Medicare Other

## 2018-09-07 ENCOUNTER — Encounter (HOSPITAL_COMMUNITY): Payer: Self-pay | Admitting: Emergency Medicine

## 2018-09-07 DIAGNOSIS — I5033 Acute on chronic diastolic (congestive) heart failure: Secondary | ICD-10-CM | POA: Diagnosis present

## 2018-09-07 DIAGNOSIS — M6289 Other specified disorders of muscle: Secondary | ICD-10-CM

## 2018-09-07 DIAGNOSIS — Z87891 Personal history of nicotine dependence: Secondary | ICD-10-CM

## 2018-09-07 DIAGNOSIS — T502X5A Adverse effect of carbonic-anhydrase inhibitors, benzothiadiazides and other diuretics, initial encounter: Secondary | ICD-10-CM | POA: Diagnosis present

## 2018-09-07 DIAGNOSIS — M6281 Muscle weakness (generalized): Secondary | ICD-10-CM | POA: Diagnosis present

## 2018-09-07 DIAGNOSIS — I10 Essential (primary) hypertension: Secondary | ICD-10-CM | POA: Diagnosis present

## 2018-09-07 DIAGNOSIS — E669 Obesity, unspecified: Secondary | ICD-10-CM | POA: Diagnosis present

## 2018-09-07 DIAGNOSIS — Z91018 Allergy to other foods: Secondary | ICD-10-CM

## 2018-09-07 DIAGNOSIS — C786 Secondary malignant neoplasm of retroperitoneum and peritoneum: Secondary | ICD-10-CM | POA: Diagnosis present

## 2018-09-07 DIAGNOSIS — C7901 Secondary malignant neoplasm of right kidney and renal pelvis: Secondary | ICD-10-CM | POA: Diagnosis present

## 2018-09-07 DIAGNOSIS — C7989 Secondary malignant neoplasm of other specified sites: Secondary | ICD-10-CM | POA: Diagnosis present

## 2018-09-07 DIAGNOSIS — R748 Abnormal levels of other serum enzymes: Secondary | ICD-10-CM

## 2018-09-07 DIAGNOSIS — R601 Generalized edema: Secondary | ICD-10-CM | POA: Diagnosis not present

## 2018-09-07 DIAGNOSIS — B379 Candidiasis, unspecified: Secondary | ICD-10-CM | POA: Diagnosis present

## 2018-09-07 DIAGNOSIS — R531 Weakness: Secondary | ICD-10-CM

## 2018-09-07 DIAGNOSIS — K5730 Diverticulosis of large intestine without perforation or abscess without bleeding: Secondary | ICD-10-CM | POA: Diagnosis present

## 2018-09-07 DIAGNOSIS — Z8249 Family history of ischemic heart disease and other diseases of the circulatory system: Secondary | ICD-10-CM

## 2018-09-07 DIAGNOSIS — I110 Hypertensive heart disease with heart failure: Principal | ICD-10-CM | POA: Diagnosis present

## 2018-09-07 DIAGNOSIS — C799 Secondary malignant neoplasm of unspecified site: Secondary | ICD-10-CM

## 2018-09-07 DIAGNOSIS — Z79899 Other long term (current) drug therapy: Secondary | ICD-10-CM

## 2018-09-07 DIAGNOSIS — K319 Disease of stomach and duodenum, unspecified: Secondary | ICD-10-CM | POA: Diagnosis present

## 2018-09-07 DIAGNOSIS — R945 Abnormal results of liver function studies: Secondary | ICD-10-CM | POA: Diagnosis not present

## 2018-09-07 DIAGNOSIS — R011 Cardiac murmur, unspecified: Secondary | ICD-10-CM | POA: Diagnosis present

## 2018-09-07 DIAGNOSIS — I471 Supraventricular tachycardia: Secondary | ICD-10-CM | POA: Diagnosis not present

## 2018-09-07 DIAGNOSIS — I2510 Atherosclerotic heart disease of native coronary artery without angina pectoris: Secondary | ICD-10-CM | POA: Diagnosis present

## 2018-09-07 DIAGNOSIS — M7989 Other specified soft tissue disorders: Secondary | ICD-10-CM | POA: Diagnosis not present

## 2018-09-07 DIAGNOSIS — N179 Acute kidney failure, unspecified: Secondary | ICD-10-CM | POA: Diagnosis present

## 2018-09-07 DIAGNOSIS — Z833 Family history of diabetes mellitus: Secondary | ICD-10-CM

## 2018-09-07 DIAGNOSIS — E876 Hypokalemia: Secondary | ICD-10-CM | POA: Diagnosis present

## 2018-09-07 DIAGNOSIS — Z90710 Acquired absence of both cervix and uterus: Secondary | ICD-10-CM

## 2018-09-07 DIAGNOSIS — Z6838 Body mass index (BMI) 38.0-38.9, adult: Secondary | ICD-10-CM

## 2018-09-07 DIAGNOSIS — E119 Type 2 diabetes mellitus without complications: Secondary | ICD-10-CM | POA: Diagnosis present

## 2018-09-07 DIAGNOSIS — E785 Hyperlipidemia, unspecified: Secondary | ICD-10-CM | POA: Diagnosis present

## 2018-09-07 DIAGNOSIS — R7989 Other specified abnormal findings of blood chemistry: Secondary | ICD-10-CM

## 2018-09-07 DIAGNOSIS — C787 Secondary malignant neoplasm of liver and intrahepatic bile duct: Secondary | ICD-10-CM | POA: Diagnosis present

## 2018-09-07 DIAGNOSIS — R160 Hepatomegaly, not elsewhere classified: Secondary | ICD-10-CM

## 2018-09-07 LAB — COMPREHENSIVE METABOLIC PANEL
ALT: 45 U/L — ABNORMAL HIGH (ref 0–44)
AST: 59 U/L — ABNORMAL HIGH (ref 15–41)
Albumin: 2.3 g/dL — ABNORMAL LOW (ref 3.5–5.0)
Alkaline Phosphatase: 254 U/L — ABNORMAL HIGH (ref 38–126)
Anion gap: 9 (ref 5–15)
BUN: 26 mg/dL — ABNORMAL HIGH (ref 8–23)
CHLORIDE: 104 mmol/L (ref 98–111)
CO2: 30 mmol/L (ref 22–32)
Calcium: 8.1 mg/dL — ABNORMAL LOW (ref 8.9–10.3)
Creatinine, Ser: 0.96 mg/dL (ref 0.44–1.00)
GFR, EST NON AFRICAN AMERICAN: 57 mL/min — AB (ref >60)
Glucose, Bld: 135 mg/dL — ABNORMAL HIGH (ref 70–99)
POTASSIUM: 4.8 mmol/L (ref 3.5–5.1)
Sodium: 143 mmol/L (ref 135–145)
Total Bilirubin: 2.2 mg/dL — ABNORMAL HIGH (ref 0.3–1.2)
Total Protein: 5.6 g/dL — ABNORMAL LOW (ref 6.5–8.1)

## 2018-09-07 LAB — CBC
HEMATOCRIT: 37.2 % (ref 36.0–46.0)
Hemoglobin: 11.3 g/dL — ABNORMAL LOW (ref 12.0–15.0)
MCH: 28.4 pg (ref 26.0–34.0)
MCHC: 30.4 g/dL (ref 30.0–36.0)
MCV: 93.5 fL (ref 80.0–100.0)
Platelets: 317 10*3/uL (ref 150–400)
RBC: 3.98 MIL/uL (ref 3.87–5.11)
RDW: 17.2 % — AB (ref 11.5–15.5)
WBC: 14 10*3/uL — ABNORMAL HIGH (ref 4.0–10.5)
nRBC: 0.4 % — ABNORMAL HIGH (ref 0.0–0.2)

## 2018-09-07 LAB — BRAIN NATRIURETIC PEPTIDE: B NATRIURETIC PEPTIDE 5: 299.9 pg/mL — AB (ref 0.0–100.0)

## 2018-09-07 LAB — CK: CK TOTAL: 86 U/L (ref 38–234)

## 2018-09-07 MED ORDER — IRBESARTAN 300 MG PO TABS
300.0000 mg | ORAL_TABLET | Freq: Every day | ORAL | Status: DC
  Administered 2018-09-08 – 2018-09-09 (×2): 300 mg via ORAL
  Filled 2018-09-07: qty 2
  Filled 2018-09-07 (×2): qty 1
  Filled 2018-09-07: qty 2
  Filled 2018-09-07: qty 1

## 2018-09-07 MED ORDER — ONDANSETRON HCL 4 MG/2ML IJ SOLN: 4.0000 mg | Freq: Four times a day (QID) | INTRAMUSCULAR | Status: DC | PRN

## 2018-09-07 MED ORDER — SODIUM CHLORIDE 0.9 % IV SOLN
250.0000 mL | INTRAVENOUS | Status: DC | PRN
  Administered 2018-09-09: 250 mL via INTRAVENOUS

## 2018-09-07 MED ORDER — FUROSEMIDE 10 MG/ML IJ SOLN
40.0000 mg | Freq: Once | INTRAMUSCULAR | Status: AC
  Administered 2018-09-08: 40 mg via INTRAVENOUS
  Filled 2018-09-07: qty 4

## 2018-09-07 MED ORDER — SODIUM CHLORIDE 0.9% FLUSH: 3.0000 mL | INTRAVENOUS | Status: DC | PRN

## 2018-09-07 MED ORDER — SODIUM CHLORIDE 0.9% FLUSH
3.0000 mL | Freq: Two times a day (BID) | INTRAVENOUS | Status: DC
  Administered 2018-09-08 – 2018-09-19 (×22): 3 mL via INTRAVENOUS

## 2018-09-07 MED ORDER — ATORVASTATIN CALCIUM 40 MG PO TABS
40.0000 mg | ORAL_TABLET | Freq: Every day | ORAL | Status: DC
  Administered 2018-09-08: 40 mg via ORAL
  Filled 2018-09-07: qty 1

## 2018-09-07 MED ORDER — ACETAMINOPHEN 325 MG PO TABS: 650.0000 mg | ORAL_TABLET | ORAL | Status: DC | PRN

## 2018-09-07 MED ORDER — ENOXAPARIN SODIUM 40 MG/0.4ML ~~LOC~~ SOLN
40.0000 mg | Freq: Every day | SUBCUTANEOUS | Status: DC
  Administered 2018-09-08: 40 mg via SUBCUTANEOUS
  Filled 2018-09-07: qty 0.4

## 2018-09-07 MED ORDER — FUROSEMIDE 10 MG/ML IJ SOLN
40.0000 mg | Freq: Every day | INTRAMUSCULAR | Status: DC
  Administered 2018-09-08 – 2018-09-09 (×2): 40 mg via INTRAVENOUS
  Filled 2018-09-07 (×2): qty 4

## 2018-09-07 MED ORDER — AMLODIPINE BESYLATE 5 MG PO TABS
10.0000 mg | ORAL_TABLET | Freq: Every day | ORAL | Status: DC
  Administered 2018-09-08: 10 mg via ORAL
  Filled 2018-09-07: qty 2

## 2018-09-07 NOTE — ED Triage Notes (Signed)
Per EMS, patient from home, c/o swelling to LLE x1 month. Denies injury and CHF. Ambulatory with assistance.  BP 149/73 HR 72 O2 955 CBG 72

## 2018-09-07 NOTE — ED Notes (Signed)
Bed: [facility] Expected date:  Expected time:  Means of arrival:  Comments: Left leg swelling

## 2018-09-07 NOTE — H&P (Signed)
History and Physical    Carrie Chaney [id number] DOB: 03-17-1941 DOA: 09/07/2018  PCP: Lucianne Lei, MD  Patient coming from: Home  I have personally briefly reviewed patient's old medical records in [place]  Chief Complaint: Leg swelling  HPI: Carrie Chaney is a 78 y.o. female with medical history significant of HLD, HTN.  Patient has had progressively worsening BLE edema for the past 1 month.  Legs are weeping clear fluid.  She has been seeing her PCP since onset and started on medications including Bumex, but symptoms have been progressively getting worse to the point that they are now interfering with mobility.  Patient states in the last week she has had difficulty even getting up because her legs are so heavy.  She is unable to stand or walk properly. She has had to call 911 for assistance.  She called her doctor today and they suggested she come to the ED.  Patient denies any chest pain.  She denies any shortness of breath.  No back pain.  No orthopnea.   ED Course: CXR shows mild cardiomegaly and pulmonary vascular congestion suggesting "mild CHF".  Albumin is 2.3, AST 59, ALT 45, Tbili 2.2.  BNP 299, CPK 86.  WBC 14k.  Creat 0.9 and BUN 26.  EKG shows non-specific T wave changes which are new since Jan 2018.  She was given 40mg  IV lasix X1 in ED, hospitalist asked to admit.   Review of Systems: As per HPI otherwise 10 point review of systems negative.   Past Medical History:  Diagnosis Date  . Diabetes mellitus without complication ([place])   . Hyperlipemia     Past Surgical History:  Procedure Laterality Date  . ABDOMINAL HYSTERECTOMY       reports that she has been smoking. She has never used smokeless tobacco. She reports that she does not drink alcohol or use drugs.  Allergies  Allergen Reactions  . Tomato Other (See Comments)    Breaks out gums    Family History  Problem Relation Age of Onset  . Hypertension Mother   . Diabetes Other   .  Cancer Neg Hx      Prior to Admission medications   Medication Sig Start Date End Date Taking? Authorizing Provider  amLODipine (NORVASC) 10 MG tablet Take 10 mg by mouth daily.   Yes [provider]  atorvastatin (LIPITOR) 40 MG tablet Take 40 mg by mouth daily.   Yes [provider]  Azilsartan Medoxomil (EDARBI) 80 MG TABS Take 80 mg by mouth every morning.   Yes [provider]  bumetanide (BUMEX) 0.5 MG tablet Take 1 mg by mouth daily.   Yes [provider]    Physical Exam: Vitals:   09/07/18 1857 09/07/18 2156 09/07/18 2158 09/07/18 2200  BP: (!) 154/53 (!) 160/50 (!) 160/50 (!) 146/52  Pulse: 77 (!) 57 71 (!) 56  Resp: 18 20 18 20   Temp: 99 F (37.2 C)  98.2 F (36.8 C)   TempSrc: Oral  Oral   SpO2: 98% 95% 95% 95%    Constitutional: NAD, calm, comfortable Eyes: PERRL, lids and conjunctivae normal ENMT: Mucous membranes are moist. Posterior pharynx clear of any exudate or lesions.Normal dentition.  Neck: normal, supple, no masses, no thyromegaly Respiratory: clear to auscultation bilaterally, no wheezing, no crackles. Normal respiratory effort. No accessory muscle use.  Cardiovascular: Regular rate and rhythm, no murmurs / rubs / gallops. 4+ BLE pitting edema 2+ pedal pulses. No carotid bruits.  Abdomen: no tenderness, no masses palpated. No hepatosplenomegaly. Bowel sounds positive.  Musculoskeletal: no clubbing / cyanosis. No joint deformity upper and lower extremities. Good ROM, no contractures. Normal muscle tone.  Skin: no rashes, lesions, ulcers. No induration Neurologic: CN 2-12 grossly intact. Sensation intact, DTR normal. Strength 5/5 in all 4.  Psychiatric: Normal judgment and insight. Alert and oriented x 3. Normal mood.    Labs on Admission: I have personally reviewed following labs and imaging studies  CBC: Recent Labs  Lab 09/07/18 2203  WBC 14.0*  HGB 11.3*  HCT 37.2  MCV 93.5  PLT 952   Basic Metabolic  Panel: Recent Labs  Lab 09/07/18 2203  NA 143  K 4.8  CL 104  CO2 30  GLUCOSE 135*  BUN 26*  CREATININE 0.96  CALCIUM 8.1*   GFR: CrCl cannot be calculated (Unknown ideal weight.). Liver Function Tests: Recent Labs  Lab 09/07/18 2203  AST 59*  ALT 45*  ALKPHOS 254*  BILITOT 2.2*  PROT 5.6*  ALBUMIN 2.3*   No results for input(s): LIPASE, AMYLASE in the last 168 hours. No results for input(s): AMMONIA in the last 168 hours. Coagulation Profile: No results for input(s): INR, PROTIME in the last 168 hours. Cardiac Enzymes: Recent Labs  Lab 09/07/18 2308  CKTOTAL 86   BNP (last 3 results) No results for input(s): PROBNP in the last 8760 hours. HbA1C: No results for input(s): HGBA1C in the last 72 hours. CBG: No results for input(s): GLUCAP in the last 168 hours. Lipid Profile: No results for input(s): CHOL, HDL, LDLCALC, TRIG, CHOLHDL, LDLDIRECT in the last 72 hours. Thyroid Function Tests: No results for input(s): TSH, T4TOTAL, FREET4, T3FREE, THYROIDAB in the last 72 hours. Anemia Panel: No results for input(s): VITAMINB12, FOLATE, FERRITIN, TIBC, IRON, RETICCTPCT in the last 72 hours. Urine analysis:    Component Value Date/Time   COLORURINE YELLOW 08/15/2016 0015   APPEARANCEUR CLEAR 08/15/2016 0015   LABSPEC 1.012 08/15/2016 0015   PHURINE 5.0 08/15/2016 0015   GLUCOSEU NEGATIVE 08/15/2016 0015   HGBUR SMALL (A) 08/15/2016 0015   BILIRUBINUR NEGATIVE 08/15/2016 0015   KETONESUR 5 (A) 08/15/2016 0015   PROTEINUR NEGATIVE 08/15/2016 0015   NITRITE NEGATIVE 08/15/2016 0015   LEUKOCYTESUR LARGE (A) 08/15/2016 0015    Radiological Exams on Admission: Dg Chest 2 View  Result Date: 09/07/2018 CLINICAL DATA:  Swelling of the left lower extremity for a month. Dyspnea. EXAM: CHEST - 2 VIEW COMPARISON:  None. FINDINGS: Mild cardiomegaly with aortic atherosclerosis. Central vascular congestion consistent with mild CHF is noted. Atelectasis is seen at the right  lung base. IMPRESSION: Cardiomegaly with aortic atherosclerosis. Central vascular congestion consistent with mild CHF. Electronically Signed   By: [name] M.D.   On: 09/07/2018 20:01    EKG: Independently reviewed.  Assessment/Plan Principal Problem:   Anasarca Active Problems:   Hypertension   Elevated liver enzymes   Proximal limb muscle weakness    1. Anasarca - 1. DDx includes R sided CHF, nephrotic syndrome, and cirrhosis (cardiogenic, nephrogenic, and hepatogenic) 1. 2d echo ordered 2. RUQ [place] and repeat LFTs in AM ordered 3. INR ordered 4. UA ordered and pending still 2. CHF pathway 3. Lasix 40mg  IV Daily 4. BMP daily 5. Strict intake and output 6. Tele monitor 2. HTN - 1. Continue home norvasc, ACEi 3. HLD - continue lipitor 4. Proximal muscle weakness - 1. CPK nl 2. TSH ordered 3. Mg ordered 4. Patient thinks that impaired mobility is  just due to severity of edema.  DVT prophylaxis: Lovenox Code Status: Full Family Communication: Family at bedside Disposition Plan: Home after admit Consults called: None Admission status: Place in 66   Kynzee Devinney, [place] [facility]  How to contact the [facility] Attending or Consulting provider [place] or covering provider during after hours [place], for this patient?  1. Check the care team in [facility] and look for a) attending/consulting [facility] provider listed and b) the [facility] team listed 2. Log into [email or web address]  Amion Physician Scheduling and messaging for groups and whole hospitals  On call and physician scheduling software for group practices, residents, hospitalists and other medical providers for call, clinic, rotation and shift schedules. On[employer] is a hospital-wide system for scheduling doctors and paging doctors on call. EasyPlot is for scientific plotting and data analysis.  [email or web address]  and use [place]'s universal password to access. If you do not have the password, please contact the hospital  operator.  3. Locate the [facility] provider you are looking for under [facility] and page to a number that you can be directly reached. 4. If you still have difficulty reaching the provider, please page the [facility] (Director on Call) for the Hospitalists listed on amion for assistance.  09/07/2018, 11:51 PM

## 2018-09-07 NOTE — ED Provider Notes (Signed)
[place] DEPT Provider Note   CSN: [id number] Arrival date & time: 09/07/18  1846    History   Chief Complaint Chief Complaint  Patient presents with  . Leg Swelling    HPI Carrie Chaney is a 78 y.o. female.   HPI Patient presents to the emergency room for evaluation of worsening leg swelling and difficulty walking.  Patient states his symptoms started about a month ago.  She noticed swelling in her bilateral lower extremities.  Her legs are also weeping clear fluid.  She has been seeing her primary care doctor and they started on medications but the symptoms are getting worse.  Patient states in the last week she has had difficulty even getting up because her legs are so heavy.  She is unable to stand or walk properly. She has had to call 911 for assistance.  She called her doctor today and they suggested she come to the ED.  Patient denies any chest pain.  She denies any shortness of breath.  No back pain. Past Medical History:  Diagnosis Date  . Diabetes mellitus without complication ([place])   . Hyperlipemia     Patient Active Problem List   Diagnosis Date Noted  . Dehydration 08/15/2016  . Acute renal failure (ARF) ([place]) 08/15/2016  . Leukocytosis 08/15/2016  . Hypertension 08/15/2016  . Postural dizziness with presyncope 08/15/2016    Past Surgical History:  Procedure Laterality Date  . ABDOMINAL HYSTERECTOMY       OB History   No obstetric history on file.      Home Medications    Prior to Admission medications   Medication Sig Start Date End Date Taking? Authorizing Provider  amLODipine (NORVASC) 10 MG tablet Take 10 mg by mouth daily.   Yes [provider]  atorvastatin (LIPITOR) 40 MG tablet Take 40 mg by mouth daily.   Yes [provider]  Azilsartan Medoxomil (EDARBI) 80 MG TABS Take 80 mg by mouth every morning.   Yes [provider]  bumetanide (BUMEX) 0.5 MG tablet Take 1 mg by mouth  daily.   Yes [provider]    Family History Family History  Problem Relation Age of Onset  . Hypertension Mother   . Diabetes Other   . Cancer Neg Hx     Social History Social History   Tobacco Use  . Smoking status: Current Every Day Smoker  . Smokeless tobacco: Never Used  . Tobacco comment: one a day  Substance Use Topics  . Alcohol use: No  . Drug use: No     Allergies   Tomato   Review of Systems Review of Systems  All other systems reviewed and are negative.    Physical Exam Updated Vital Signs BP (!) 146/52   Pulse (!) 56   Temp 98.2 F (36.8 C) (Oral)   Resp 20   SpO2 95%   Physical Exam Vitals signs and nursing note reviewed.  Constitutional:      General: She is not in acute distress.    Appearance: She is well-developed.  HENT:     Head: Normocephalic and atraumatic.     Right Ear: External ear normal.     Left Ear: External ear normal.  Eyes:     General: No scleral icterus.       Right eye: No discharge.        Left eye: No discharge.     Conjunctiva/sclera: Conjunctivae normal.  Neck:  Musculoskeletal: Neck supple.     Trachea: No tracheal deviation.  Cardiovascular:     Rate and Rhythm: Normal rate and regular rhythm.  Pulmonary:     Effort: Pulmonary effort is normal. No respiratory distress.     Breath sounds: Normal breath sounds. No stridor. No wheezing or rales.  Abdominal:     General: Bowel sounds are normal. There is no distension.     Palpations: Abdomen is soft.     Tenderness: There is no abdominal tenderness. There is no guarding or rebound.     Comments: Protuberant abdomen  Musculoskeletal:        General: No tenderness.     Right lower leg: Edema present.     Left lower leg: Edema present.     Comments: 4+pitting edema, leg is weeping serous fluid  Skin:    General: Skin is warm and dry.     Findings: No rash.  Neurological:     Mental Status: She is alert.     Cranial Nerves: No cranial nerve  deficit (no facial droop, extraocular movements intact, no slurred speech).     Sensory: No sensory deficit.     Motor: Weakness present. No abnormal muscle tone or seizure activity.     Coordination: Coordination normal.     Comments: Proximal muscle weakness, pt unable to sit up in bed without assistance, unable to stand, bear weight  5/5 plantar flexion strength, normal grip strength      ED Treatments / Results  Labs (all labs ordered are listed, but only abnormal results are displayed) Labs Reviewed  CBC - Abnormal; Notable for the following components:      Result Value   WBC 14.0 (*)    Hemoglobin 11.3 (*)    RDW 17.2 (*)    nRBC 0.4 (*)    All other components within normal limits  COMPREHENSIVE METABOLIC PANEL - Abnormal; Notable for the following components:   Glucose, Bld 135 (*)    BUN 26 (*)    Calcium 8.1 (*)    Total Protein 5.6 (*)    Albumin 2.3 (*)    AST 59 (*)    ALT 45 (*)    Alkaline Phosphatase 254 (*)    Total Bilirubin 2.2 (*)    GFR calc non Af Amer 57 (*)    All other components within normal limits  BRAIN NATRIURETIC PEPTIDE - Abnormal; Notable for the following components:   B Natriuretic Peptide 299.9 (*)    All other components within normal limits  URINALYSIS, ROUTINE W REFLEX MICROSCOPIC  CK    EKG EKG Interpretation  Date/Time:  Wednesday September 07 2018 21:16:40 EST Ventricular Rate:  61 PR Interval:    QRS Duration: 101 QT Interval:  410 QTC Calculation: 413 R Axis:   62 Text Interpretation:  Sinus rhythm Short PR interval Borderline repolarization abnormality Borderline ST elevation, anterior leads Baseline wander in lead(s) V4 nonspecific t wave changes inferiorly since last tracing Confirmed by Dorie Rank [phone]) on 09/07/2018 9:20:06 PM   Radiology Dg Chest 2 View  Result Date: 09/07/2018 CLINICAL DATA:  Swelling of the left lower extremity for a month. Dyspnea. EXAM: CHEST - 2 VIEW COMPARISON:  None. FINDINGS: Mild  cardiomegaly with aortic atherosclerosis. Central vascular congestion consistent with mild CHF is noted. Atelectasis is seen at the right lung base. IMPRESSION: Cardiomegaly with aortic atherosclerosis. Central vascular congestion consistent with mild CHF. Electronically Signed   By: Meredith Leeds.D.  On: 09/07/2018 20:01    Procedures Procedures (including critical care time)  Medications Ordered in ED Medications  furosemide (LASIX) injection 40 mg (has no administration in time range)     Initial Impression / Assessment and Plan / ED Course  I have reviewed the triage vital signs and the nursing notes.  Pertinent labs & imaging results that were available during my care of the patient were reviewed by me and considered in my medical decision making (see chart for details).  Clinical Course as of Sep 07 2312  Wed Sep 07, 2018  2311 Labs notable for elevated blood cell count.  LFTs are elevated.   [JK]  2311 Mild CHF noted on chest x-ray   [JK]    Clinical Course User Index [JK] Dorie Rank, MD   Patient presented to the ED for evaluation of weakness and peripheral edema.  On exam the patient has a significant amount of edema in her extremities.  Patient does have evidence of mild CHF on chest x-ray.  IV diuretics have been ordered.  Patient also has significant weakness.  He has difficulty sitting up in bed.  I am not sure if this is solely related to the edema.  LFTs are elevated.  Will add on CK to assess for myositis.  May end up needing spine imaging.  Will consult with medical service for admission.  Final Clinical Impressions(s) / ED Diagnoses   Final diagnoses:  Anasarca  Abnormal LFTs  Weakness      Dorie Rank, MD 09/07/18 2314

## 2018-09-08 ENCOUNTER — Observation Stay (HOSPITAL_COMMUNITY): Payer: Medicare Other

## 2018-09-08 ENCOUNTER — Inpatient Hospital Stay (HOSPITAL_COMMUNITY): Payer: Medicare Other

## 2018-09-08 ENCOUNTER — Encounter (HOSPITAL_COMMUNITY): Payer: Self-pay

## 2018-09-08 ENCOUNTER — Other Ambulatory Visit: Payer: Self-pay

## 2018-09-08 DIAGNOSIS — Z8249 Family history of ischemic heart disease and other diseases of the circulatory system: Secondary | ICD-10-CM | POA: Diagnosis not present

## 2018-09-08 DIAGNOSIS — C7989 Secondary malignant neoplasm of other specified sites: Secondary | ICD-10-CM | POA: Diagnosis present

## 2018-09-08 DIAGNOSIS — C801 Malignant (primary) neoplasm, unspecified: Secondary | ICD-10-CM | POA: Diagnosis not present

## 2018-09-08 DIAGNOSIS — Z79899 Other long term (current) drug therapy: Secondary | ICD-10-CM | POA: Diagnosis not present

## 2018-09-08 DIAGNOSIS — R945 Abnormal results of liver function studies: Secondary | ICD-10-CM | POA: Diagnosis not present

## 2018-09-08 DIAGNOSIS — K5730 Diverticulosis of large intestine without perforation or abscess without bleeding: Secondary | ICD-10-CM | POA: Diagnosis present

## 2018-09-08 DIAGNOSIS — I2510 Atherosclerotic heart disease of native coronary artery without angina pectoris: Secondary | ICD-10-CM | POA: Diagnosis present

## 2018-09-08 DIAGNOSIS — E669 Obesity, unspecified: Secondary | ICD-10-CM | POA: Diagnosis present

## 2018-09-08 DIAGNOSIS — I110 Hypertensive heart disease with heart failure: Secondary | ICD-10-CM | POA: Diagnosis present

## 2018-09-08 DIAGNOSIS — E785 Hyperlipidemia, unspecified: Secondary | ICD-10-CM | POA: Diagnosis present

## 2018-09-08 DIAGNOSIS — Z90710 Acquired absence of both cervix and uterus: Secondary | ICD-10-CM | POA: Diagnosis not present

## 2018-09-08 DIAGNOSIS — R601 Generalized edema: Secondary | ICD-10-CM | POA: Diagnosis not present

## 2018-09-08 DIAGNOSIS — R609 Edema, unspecified: Secondary | ICD-10-CM | POA: Diagnosis not present

## 2018-09-08 DIAGNOSIS — C799 Secondary malignant neoplasm of unspecified site: Secondary | ICD-10-CM | POA: Diagnosis not present

## 2018-09-08 DIAGNOSIS — I5031 Acute diastolic (congestive) heart failure: Secondary | ICD-10-CM

## 2018-09-08 DIAGNOSIS — C787 Secondary malignant neoplasm of liver and intrahepatic bile duct: Secondary | ICD-10-CM | POA: Diagnosis present

## 2018-09-08 DIAGNOSIS — C7901 Secondary malignant neoplasm of right kidney and renal pelvis: Secondary | ICD-10-CM | POA: Diagnosis present

## 2018-09-08 DIAGNOSIS — Z833 Family history of diabetes mellitus: Secondary | ICD-10-CM | POA: Diagnosis not present

## 2018-09-08 DIAGNOSIS — R531 Weakness: Secondary | ICD-10-CM

## 2018-09-08 DIAGNOSIS — R011 Cardiac murmur, unspecified: Secondary | ICD-10-CM | POA: Diagnosis present

## 2018-09-08 DIAGNOSIS — Z91018 Allergy to other foods: Secondary | ICD-10-CM | POA: Diagnosis not present

## 2018-09-08 DIAGNOSIS — I471 Supraventricular tachycardia: Secondary | ICD-10-CM | POA: Diagnosis not present

## 2018-09-08 DIAGNOSIS — T502X5A Adverse effect of carbonic-anhydrase inhibitors, benzothiadiazides and other diuretics, initial encounter: Secondary | ICD-10-CM | POA: Diagnosis present

## 2018-09-08 DIAGNOSIS — I10 Essential (primary) hypertension: Secondary | ICD-10-CM | POA: Diagnosis not present

## 2018-09-08 DIAGNOSIS — I5033 Acute on chronic diastolic (congestive) heart failure: Secondary | ICD-10-CM | POA: Diagnosis present

## 2018-09-08 DIAGNOSIS — R748 Abnormal levels of other serum enzymes: Secondary | ICD-10-CM | POA: Diagnosis not present

## 2018-09-08 DIAGNOSIS — R7989 Other specified abnormal findings of blood chemistry: Secondary | ICD-10-CM

## 2018-09-08 DIAGNOSIS — E876 Hypokalemia: Secondary | ICD-10-CM | POA: Diagnosis present

## 2018-09-08 DIAGNOSIS — K319 Disease of stomach and duodenum, unspecified: Secondary | ICD-10-CM | POA: Diagnosis present

## 2018-09-08 DIAGNOSIS — M7989 Other specified soft tissue disorders: Secondary | ICD-10-CM | POA: Diagnosis present

## 2018-09-08 DIAGNOSIS — B379 Candidiasis, unspecified: Secondary | ICD-10-CM | POA: Diagnosis present

## 2018-09-08 DIAGNOSIS — N179 Acute kidney failure, unspecified: Secondary | ICD-10-CM | POA: Diagnosis present

## 2018-09-08 DIAGNOSIS — E119 Type 2 diabetes mellitus without complications: Secondary | ICD-10-CM | POA: Diagnosis present

## 2018-09-08 DIAGNOSIS — C786 Secondary malignant neoplasm of retroperitoneum and peritoneum: Secondary | ICD-10-CM | POA: Diagnosis present

## 2018-09-08 DIAGNOSIS — Z6838 Body mass index (BMI) 38.0-38.9, adult: Secondary | ICD-10-CM | POA: Diagnosis not present

## 2018-09-08 LAB — HEPATIC FUNCTION PANEL
ALT: 47 U/L — ABNORMAL HIGH (ref 0–44)
AST: 32 U/L (ref 15–41)
Albumin: 2.3 g/dL — ABNORMAL LOW (ref 3.5–5.0)
Alkaline Phosphatase: 239 U/L — ABNORMAL HIGH (ref 38–126)
Bilirubin, Direct: 0.3 mg/dL — ABNORMAL HIGH (ref 0.0–0.2)
Indirect Bilirubin: 0.9 mg/dL (ref 0.3–0.9)
TOTAL PROTEIN: 5.5 g/dL — AB (ref 6.5–8.1)
Total Bilirubin: 1.2 mg/dL (ref 0.3–1.2)

## 2018-09-08 LAB — BASIC METABOLIC PANEL
Anion gap: 8 (ref 5–15)
BUN: 23 mg/dL (ref 8–23)
CHLORIDE: 105 mmol/L (ref 98–111)
CO2: 32 mmol/L (ref 22–32)
Calcium: 8.1 mg/dL — ABNORMAL LOW (ref 8.9–10.3)
Creatinine, Ser: 0.94 mg/dL (ref 0.44–1.00)
GFR calc Af Amer: >60 mL/min (ref >60)
GFR calc non Af Amer: 58 mL/min — ABNORMAL LOW (ref >60)
Glucose, Bld: 131 mg/dL — ABNORMAL HIGH (ref 70–99)
Potassium: 3 mmol/L — ABNORMAL LOW (ref 3.5–5.1)
Sodium: 145 mmol/L (ref 135–145)

## 2018-09-08 LAB — PROTIME-INR
INR: 1.04
Prothrombin Time: 13.5 seconds (ref 11.4–15.2)

## 2018-09-08 LAB — ECHOCARDIOGRAM COMPLETE
Height: 68 in
Weight: 4095.26 oz

## 2018-09-08 LAB — MAGNESIUM: Magnesium: 2 mg/dL (ref 1.7–2.4)

## 2018-09-08 LAB — URINALYSIS, ROUTINE W REFLEX MICROSCOPIC
Bilirubin Urine: NEGATIVE
GLUCOSE, UA: NEGATIVE mg/dL
Hgb urine dipstick: NEGATIVE
Ketones, ur: NEGATIVE mg/dL
LEUKOCYTE UA: NEGATIVE
Nitrite: NEGATIVE
PH: 7 (ref 5.0–8.0)
Protein, ur: NEGATIVE mg/dL
Specific Gravity, Urine: 1.008 (ref 1.005–1.030)

## 2018-09-08 LAB — PHOSPHORUS: Phosphorus: 2.8 mg/dL (ref 2.5–4.6)

## 2018-09-08 LAB — TSH: TSH: 2.173 u[IU]/mL (ref 0.350–4.500)

## 2018-09-08 MED ORDER — POTASSIUM CHLORIDE CRYS ER 20 MEQ PO TBCR
40.0000 meq | EXTENDED_RELEASE_TABLET | Freq: Two times a day (BID) | ORAL | Status: DC
  Administered 2018-09-08 – 2018-09-09 (×3): 40 meq via ORAL
  Filled 2018-09-08 (×3): qty 2

## 2018-09-08 MED ORDER — IOHEXOL 300 MG/ML  SOLN
100.0000 mL | Freq: Once | INTRAMUSCULAR | Status: AC | PRN
  Administered 2018-09-08: 100 mL via INTRAVENOUS

## 2018-09-08 MED ORDER — SODIUM CHLORIDE (PF) 0.9 % IJ SOLN
INTRAMUSCULAR | Status: AC
  Administered 2018-09-09: 3 mL via INTRAVENOUS
  Filled 2018-09-08: qty 50

## 2018-09-08 MED ORDER — IOHEXOL 300 MG/ML  SOLN
30.0000 mL | Freq: Once | INTRAMUSCULAR | Status: AC | PRN
  Administered 2018-09-08: 30 mL via ORAL

## 2018-09-08 NOTE — Care Management Note (Addendum)
Case Management Note  Patient Details  Name: Carrie Chaney MRN: [id number] Date of Birth: [birth year]  Subjective/Objective:                  Discharge Planning  Action/Plan: Text sent to [facility] for home hearth failure screening to be done. dper Ronalee Belts with [facility] unable to do due to out of network.s tct-[facility] at [address]-we do not do these. It is considered a diagnostic test. tcf-Kathy with [facility]-will do the home heart failure screening and they are in place for [facility]hc. Expected Discharge Date:                  Expected Discharge Plan:     In-House Referral:     Discharge planning Services     Post Acute Care Choice:    Choice offered to:     DME Arranged:    DME Agency:     HH Arranged:    HH Agency:     Status of Service:     If discussed at [employer], dates discussed:    Additional Comments:  Leeroy Cha, RN 09/08/2018, 10:47 AM

## 2018-09-08 NOTE — Progress Notes (Addendum)
PROGRESS NOTE  Carrie Chaney  AJO:[id number] DOB: 26-Oct-1940 DOA: 09/07/2018 PCP: Lucianne Lei, MD   Brief Narrative: Carrie Chaney is a 78 y.o. female with a history of HTN, HLD who presented for 4 weeks of progressive lower extremity swelling which continued despite starting bumex per PCP. This has caused limited mobility. CXR showed cardiomegaly, pulmonary congestion, albumin 2.3 with mild LFT elevations. BNP 299, CK 86, WBC 14k, creatinine 0.9, BUN 26. She was admitted for anasarca, started on IV diuretic, and work up included RUQ U/S which demonstrated multiple masses in the liver. Further work up is underway.  Assessment & Plan: Principal Problem:   Anasarca Active Problems:   Hypertension   Elevated liver enzymes   Proximal limb muscle weakness  Anasarca: Grossly fluid overloaded based on CXR pulm edema and exam with significant pitting edema. Reported dry weight ~220lbs per patient and currently 255lbs. Suspect hypoalbuminemia and possibly cardiac failure contributing, though uncertain at this time. Would prolonged PT/INR if hepatic synthetic capacity was truly decreased. No proteinuria noted. TSH wnl. Suspect BNP of 299 is suppressed due to obesity. - Continue lasix 40mg  IV daily, and titrate to effect. Would not expect po diuretic to be effective with diffuse, and presumed intestinal, edema. - Monitor I/O, daily weights - Monitor K, Cr while diuresing.  - Echocardiogram pending.   Hepatic lesions: ranging 2.5cm - 10.5cm on U/S in smoker. No clinical evidence of abscess/infection, so leading possibility is metastatic CA of unknown primary.  - Per radiology recommendations, will get CT C/A/P with contrast for search of malignancy.  - Mildly elevated LFTs. Will trend.  HTN:  - Hold norvasc for now. Could theoretically contribute to edema and may be replaced by alternative agent pending echo.  Hypokalemia:  - Supplement and recheck in AM with loop diuretic administration.     Obesity: BMI 38, though due expect much is fluid weight as above. Though still will qualify for obesity. and weight loss is recommended.  Hyperlipidemia:  - Hold statin  Proximal muscle weakness: CPK nl, TSH nl.  - PT/OT  DVT prophylaxis: Lovenox Code Status: Full Family Communication: None at bedside Disposition Plan: Uncertain, reporting significant weakness. She remains with anasarca that I anticipate will require several days of diuresis. Will convert to inpatient  Consultants:   None  Procedures:   Echocardiogram pending  Antimicrobials:  None   Subjective: Does not feel ill, but diffusely weak, legs heavy, minimal shortness of breath but not getting up. No chest pain. Stopped smoking very recently. Feels her legs have gone down since admission somewhat but still very swollen and abdomen is bloated.  Objective: Vitals:   09/08/18 0135 09/08/18 0454 09/08/18 0500 09/08/18 1337  BP: (!) 173/55 (!) 154/52  (!) 157/46  Pulse: 62 (!) 59  64  Resp: 17 20  16   Temp: 98.2 F (36.8 C) 98.6 F (37 C)  98.4 F (36.9 C)  TempSrc: Oral Oral    SpO2: 95% 94%  97%  Weight:   116.1 kg   Height:        Intake/Output Summary (Last 24 hours) at 09/08/2018 1351 Last data filed at 09/08/2018 1148 Gross per 24 hour  Intake 6 ml  Output 1250 ml  Net -1244 ml   Filed Weights   09/08/18 0045 09/08/18 0500  Weight: 116.1 kg 116.1 kg    Gen: 78 y.o. female in no distress Pulm: Non-labored but tachypneic with bibasilar crackles, diminished.  CV: Regular rate and rhythm. No murmur, rub, or  gallop. +JVD, 3+ pitting edema throughout legs advancing to lower abdomen.  GI: Abdomen soft, obese, mildly distended, with normoactive bowel sounds. No organomegaly or masses felt. Ext: Warm, no deformities Skin: No rashes, lesions or ulcers Neuro: Alert and oriented. No focal neurological deficits. Psych: Judgement and insight appear normal. Mood & affect appropriate.   Data Reviewed: I  have personally reviewed following labs and imaging studies  CBC: Recent Labs  Lab 09/07/18 2203  WBC 14.0*  HGB 11.3*  HCT 37.2  MCV 93.5  PLT 277   Basic Metabolic Panel: Recent Labs  Lab 09/07/18 2203 09/08/18 0652  NA 143 145  K 4.8 3.0*  CL 104 105  CO2 30 32  GLUCOSE 135* 131*  BUN 26* 23  CREATININE 0.96 0.94  CALCIUM 8.1* 8.1*  MG  --  2.0  PHOS  --  2.8   GFR: Estimated Creatinine Clearance: 66 mL/min (by C-G formula based on SCr of 0.94 mg/dL). Liver Function Tests: Recent Labs  Lab 09/07/18 2203 09/08/18 0652  AST 59* 32  ALT 45* 47*  ALKPHOS 254* 239*  BILITOT 2.2* 1.2  PROT 5.6* 5.5*  ALBUMIN 2.3* 2.3*   No results for input(s): LIPASE, AMYLASE in the last 168 hours. No results for input(s): AMMONIA in the last 168 hours. Coagulation Profile: Recent Labs  Lab 09/08/18 0652  INR 1.04   Cardiac Enzymes: Recent Labs  Lab 09/07/18 2308  CKTOTAL 86   BNP (last 3 results) No results for input(s): PROBNP in the last 8760 hours. HbA1C: No results for input(s): HGBA1C in the last 72 hours. CBG: No results for input(s): GLUCAP in the last 168 hours. Lipid Profile: No results for input(s): CHOL, HDL, LDLCALC, TRIG, CHOLHDL, LDLDIRECT in the last 72 hours. Thyroid Function Tests: Recent Labs    09/08/18 0652  TSH 2.173   Anemia Panel: No results for input(s): VITAMINB12, FOLATE, FERRITIN, TIBC, IRON, RETICCTPCT in the last 72 hours. Urine analysis:    Component Value Date/Time   COLORURINE YELLOW 09/08/2018 [place] 09/08/2018 0447   LABSPEC 1.008 09/08/2018 0447   PHURINE 7.0 09/08/2018 [place] 09/08/2018 0447   HGBUR NEGATIVE 09/08/2018 [place] 09/08/2018 [place] 09/08/2018 0447   PROTEINUR NEGATIVE 09/08/2018 0447   NITRITE NEGATIVE 09/08/2018 0447   LEUKOCYTESUR NEGATIVE 09/08/2018 0447   No results found for this or any previous visit (from the past 240  hour(s)).    Radiology Studies: Dg Chest 2 View  Result Date: 09/07/2018 CLINICAL DATA:  Swelling of the left lower extremity for a month. Dyspnea. EXAM: CHEST - 2 VIEW COMPARISON:  None. FINDINGS: Mild cardiomegaly with aortic atherosclerosis. Central vascular congestion consistent with mild CHF is noted. Atelectasis is seen at the right lung base. IMPRESSION: Cardiomegaly with aortic atherosclerosis. Central vascular congestion consistent with mild CHF. Electronically Signed   By: [name] M.D.   On: 09/07/2018 20:01   US Abdomen Limited Ruq  Result Date: 09/08/2018 CLINICAL DATA:  78 year old female with abnormal LFTs.  Anasarca. EXAM: ULTRASOUND ABDOMEN LIMITED RIGHT UPPER QUADRANT COMPARISON:  None. FINDINGS: Gallbladder: No gallstones or wall thickening visualized. No sonographic Murphy sign noted by sonographer. Common bile duct: Diameter: 4 millimeters, normal. Liver: Abnormal, highly irregular and heterogeneous liver echogenicity suggesting multiple liver masses ranging from 2.5 centimeters to 10.5 centimeters diameter (images 18, 35, 43). The liver contour seems to remain relatively smooth (image 31). Portal vein is patent on  color Doppler imaging with normal direction of blood flow towards the liver. Other findings: No ascites. IMPRESSION: Positive for multiple liver masses without obvious cirrhosis. Favor metastatic disease over primary liver tumor. CT chest abdomen and pelvis (oral and IV contrast preferred) recommended. Electronically Signed   By: Genevie Ann M.D.   On: 09/08/2018 02:20    Scheduled Meds: . atorvastatin  40 mg Oral q1800  . enoxaparin (LOVENOX) injection  40 mg Subcutaneous Daily  . furosemide  40 mg Intravenous Daily  . irbesartan  300 mg Oral Daily  . potassium chloride  40 mEq Oral BID  . sodium chloride flush  3 mL Intravenous Q12H   Continuous Infusions: . sodium chloride       LOS: 0 days   Time spent: 35 minutes.  Patrecia Pour, MD [facility] [email or web address] Password [email or web address] 09/08/2018, 1:51 PM

## 2018-09-08 NOTE — ED Notes (Signed)
ED TO INPATIENT HANDOFF REPORT  Name/Age/Gender Carrie Chaney 78 y.o. female  Code Status    Code Status Orders  (From admission, onward)         Start     Ordered   09/07/18 2335  Full code  Continuous     09/07/18 2335        Code Status History    Date Active Date Inactive Code Status Order ID Comments User Context   08/15/2016 0304 08/16/2016 1805 DNR [id number]  Toy Baker, MD Inpatient      Home/SNF/Other Home  Chief Complaint Left leg edema  Level of Care/Admitting Diagnosis ED Disposition    ED Disposition Condition [place] [facility] Area: [facility] [[id number]]  Level of Care: Telemetry [5]  Admit to tele based on following criteria: Acute CHF  Diagnosis: Anasarca [914782]  Admitting Physician: Etta Quill [[id number]]  Attending Physician: Etta Quill [[id number]]  PT Class (Do Not Modify): Observation [104]  PT Acc Code (Do Not Modify): Observation [10022]       Medical History Past Medical History:  Diagnosis Date  . Diabetes mellitus without complication ([place])   . Hyperlipemia     Allergies Allergies  Allergen Reactions  . Tomato Other (See Comments)    Breaks out gums    IV Location/Drains/Wounds Patient Lines/Drains/Airways Status   Active Line/Drains/Airways    Name:   Placement date:   Placement time:   Site:   Days:   Peripheral IV 09/07/18 Right Antecubital   09/07/18    2159    Antecubital   1          Labs/Imaging Results for orders placed or performed during the hospital encounter of 09/07/18 (from the past 48 hour(s))  CBC     Status: Abnormal   Collection Time: 09/07/18 10:03 PM  Result Value Ref Range   WBC 14.0 (H) 4.0 - 10.5 K/uL   RBC 3.98 3.87 - 5.11 MIL/uL   Hemoglobin 11.3 (L) 12.0 - 15.0 g/dL   HCT 37.2 36.0 - 46.0 %   MCV 93.5 80.0 - 100.0 fL   MCH 28.4 26.0 - 34.0 pg   MCHC 30.4 30.0 - 36.0 g/dL   RDW 17.2 (H) 11.5 - 15.5 %   Platelets 317 150 - 400 K/uL   nRBC 0.4  (H) 0.0 - 0.2 %    Comment: Performed at [facility], [place] [address] [place], [place] [address]  Comprehensive metabolic panel     Status: Abnormal   Collection Time: 09/07/18 10:03 PM  Result Value Ref Range   Sodium 143 135 - 145 mmol/L   Potassium 4.8 3.5 - 5.1 mmol/L   Chloride 104 98 - 111 mmol/L   CO2 30 22 - 32 mmol/L   Glucose, Bld 135 (H) 70 - 99 mg/dL   BUN 26 (H) 8 - 23 mg/dL   Creatinine, Ser 0.96 0.44 - 1.00 mg/dL   Calcium 8.1 (L) 8.9 - 10.3 mg/dL   Total Protein 5.6 (L) 6.5 - 8.1 g/dL   Albumin 2.3 (L) 3.5 - 5.0 g/dL   AST 59 (H) 15 - 41 U/L   ALT 45 (H) 0 - 44 U/L   Alkaline Phosphatase 254 (H) 38 - 126 U/L   Total Bilirubin 2.2 (H) 0.3 - 1.2 mg/dL   GFR calc non Af Amer 57 (L) >60 mL/min   GFR calc Af Amer >60 >60 mL/min   Anion gap 9 5 -  15    Comment: Performed at [facility], [place] [address] [place],  [address]  Brain natriuretic peptide     Status: Abnormal   Collection Time: 09/07/18 10:03 PM  Result Value Ref Range   B Natriuretic Peptide 299.9 (H) 0.0 - 100.0 pg/mL    Comment: Performed at [facility], [place] [address], [place],  [address]  CK     Status: None   Collection Time: 09/07/18 11:08 PM  Result Value Ref Range   Total CK 86 38 - 234 U/L    Comment: Performed at [facility], [place] [address] [place],  [address]   Dg Chest 2 View  Result Date: 09/07/2018 CLINICAL DATA:  Swelling of the left lower extremity for a month. Dyspnea. EXAM: CHEST - 2 VIEW COMPARISON:  None. FINDINGS: Mild cardiomegaly with aortic atherosclerosis. Central vascular congestion consistent with mild CHF is noted. Atelectasis is seen at the right lung base. IMPRESSION: Cardiomegaly with aortic atherosclerosis. Central vascular congestion consistent with mild CHF. Electronically Signed   By: [name] M.D.   On: 09/07/2018 20:01   EKG Interpretation  Date/Time:  Wednesday  September 07 2018 21:16:40 EST Ventricular Rate:  61 PR Interval:    QRS Duration: 101 QT Interval:  410 QTC Calculation: 413 R Axis:   62 Text Interpretation:  Sinus rhythm Short PR interval Borderline repolarization abnormality Borderline ST elevation, anterior leads Baseline wander in lead(s) V4 nonspecific t wave changes inferiorly since last tracing Confirmed by Dorie Rank [id number]) on 09/07/2018 9:20:06 PM   Pending Labs Unresulted Labs (From admission, onward)    Start     Ordered   09/08/18 8466  Basic metabolic panel  Daily,   R     09/07/18 2335   09/08/18 0500  Hepatic function panel  Tomorrow morning,   R     09/07/18 2358   09/07/18 2354  Protime-INR  Once,   R     09/07/18 2353   09/07/18 2336  Phosphorus  Once,   R     09/07/18 2335   09/07/18 2336  Magnesium  Once,   R     09/07/18 2335   09/07/18 2330  TSH  Once,   R     09/07/18 2329   09/07/18 1928  Urinalysis, Routine w reflex microscopic  Once,   R     09/07/18 1928          Vitals/Pain Today's Vitals   09/07/18 2200 09/07/18 2255 09/07/18 2300 09/08/18 0000  BP: (!) 146/52  (!) 150/59 (!) 145/46  Pulse: (!) 56  63 (!) 59  Resp: 20  16 19   Temp:      TempSrc:      SpO2: 95%  95% 96%  PainSc:  0-No pain      Isolation Precautions No active isolations  Medications Medications  furosemide (LASIX) injection 40 mg (has no administration in time range)  amLODipine (NORVASC) tablet 10 mg (has no administration in time range)  atorvastatin (LIPITOR) tablet 40 mg (has no administration in time range)  irbesartan (AVAPRO) tablet 300 mg (has no administration in time range)  furosemide (LASIX) injection 40 mg (has no administration in time range)  sodium chloride flush (NS) 0.9 % injection 3 mL (has no administration in time range)  sodium chloride flush (NS) 0.9 % injection 3 mL (has no administration in time range)  0.9 %  sodium chloride infusion (has no administration in  time range)  acetaminophen  (TYLENOL) tablet 650 mg (has no administration in time range)  ondansetron (ZOFRAN) injection 4 mg (has no administration in time range)  enoxaparin (LOVENOX) injection 40 mg (has no administration in time range)    Mobility non-ambulatory

## 2018-09-08 NOTE — Progress Notes (Signed)
New Admission Note:    Arrival Method: Bed Mental Orientation: A X O X 4 Telemetry: [address] Assessment:  complete Skin: patient has MASD in groin area. Scratch marks/ swelling lower legs. Coated white tongue  Iv: 20 R AC SL Pain: none 0/10 Tubes: n/a Safety Measures: Safety Fall Prevention Plan has been given, discussed and signed Admission: Completed [facility] Orientation: Patient has been orientated to the room, unit, and staff.  Family: n/a  Orders have been reviewed and implemented. Will continue to monitor the patient. Call light has been placed within reach and bed alarm has been activated.   Tawni Carnes, RN Phone number: [phone]

## 2018-09-08 NOTE — Progress Notes (Signed)
LCSW consulted for home health needs.   RNCM will follow for home health needs.   LCSW signing off.   Carolin Coy [place]

## 2018-09-08 NOTE — Progress Notes (Signed)
  Echocardiogram 2D Echocardiogram has been performed.  Jannett Celestine 09/08/2018, 11:47 AM

## 2018-09-08 NOTE — Care Management Obs Status (Signed)
[place] NOTIFICATION   Patient Details  Name: Roshonda Sperl MRN: [id number] Date of Birth: Sep 05, 1940   Medicare Observation Status Notification Given:  Yes    Leeroy Cha, RN 09/08/2018, 10:42 AM

## 2018-09-09 ENCOUNTER — Inpatient Hospital Stay (HOSPITAL_COMMUNITY): Payer: Medicare Other

## 2018-09-09 LAB — BASIC METABOLIC PANEL
Anion gap: 10 (ref 5–15)
BUN: 22 mg/dL (ref 8–23)
CO2: 31 mmol/L (ref 22–32)
Calcium: 8 mg/dL — ABNORMAL LOW (ref 8.9–10.3)
Chloride: 103 mmol/L (ref 98–111)
Creatinine, Ser: 0.91 mg/dL (ref 0.44–1.00)
GFR calc Af Amer: >60 mL/min (ref >60)
GFR calc non Af Amer: >60 mL/min (ref >60)
Glucose, Bld: 167 mg/dL — ABNORMAL HIGH (ref 70–99)
Potassium: 3 mmol/L — ABNORMAL LOW (ref 3.5–5.1)
Sodium: 144 mmol/L (ref 135–145)

## 2018-09-09 LAB — CBC
HCT: 36 % (ref 36.0–46.0)
Hemoglobin: 10.9 g/dL — ABNORMAL LOW (ref 12.0–15.0)
MCH: 28.5 pg (ref 26.0–34.0)
MCHC: 30.3 g/dL (ref 30.0–36.0)
MCV: 94.2 fL (ref 80.0–100.0)
PLATELETS: 293 10*3/uL (ref 150–400)
RBC: 3.82 MIL/uL — ABNORMAL LOW (ref 3.87–5.11)
RDW: 17.1 % — ABNORMAL HIGH (ref 11.5–15.5)
WBC: 14.3 10*3/uL — ABNORMAL HIGH (ref 4.0–10.5)
nRBC: 0.8 % — ABNORMAL HIGH (ref 0.0–0.2)

## 2018-09-09 MED ORDER — LIDOCAINE HCL 1 % IJ SOLN
INTRAMUSCULAR | Status: AC
  Filled 2018-09-09: qty 20

## 2018-09-09 MED ORDER — ENOXAPARIN SODIUM 40 MG/0.4ML ~~LOC~~ SOLN
40.0000 mg | Freq: Every day | SUBCUTANEOUS | Status: DC
  Administered 2018-09-10 – 2018-09-20 (×10): 40 mg via SUBCUTANEOUS
  Filled 2018-09-09 (×10): qty 0.4

## 2018-09-09 MED ORDER — LIDOCAINE HCL (PF) 1 % IJ SOLN
INTRAMUSCULAR | Status: AC | PRN
  Administered 2018-09-09: 10 mL

## 2018-09-09 MED ORDER — GELATIN ABSORBABLE 12-7 MM EX MISC
CUTANEOUS | Status: AC
  Filled 2018-09-09: qty 1

## 2018-09-09 MED ORDER — MIDAZOLAM HCL 2 MG/2ML IJ SOLN
INTRAMUSCULAR | Status: AC
  Filled 2018-09-09: qty 4

## 2018-09-09 MED ORDER — MIDAZOLAM HCL 2 MG/2ML IJ SOLN
INTRAMUSCULAR | Status: AC | PRN
  Administered 2018-09-09: 1 mg via INTRAVENOUS

## 2018-09-09 MED ORDER — FENTANYL CITRATE (PF) 100 MCG/2ML IJ SOLN
INTRAMUSCULAR | Status: AC | PRN
  Administered 2018-09-09: 50 ug via INTRAVENOUS

## 2018-09-09 MED ORDER — FUROSEMIDE 10 MG/ML IJ SOLN
40.0000 mg | Freq: Two times a day (BID) | INTRAMUSCULAR | Status: DC
  Administered 2018-09-09 – 2018-09-11 (×4): 40 mg via INTRAVENOUS
  Filled 2018-09-09 (×4): qty 4

## 2018-09-09 MED ORDER — POTASSIUM CHLORIDE CRYS ER 20 MEQ PO TBCR
40.0000 meq | EXTENDED_RELEASE_TABLET | Freq: Three times a day (TID) | ORAL | Status: DC
  Administered 2018-09-09 – 2018-09-10 (×5): 40 meq via ORAL
  Filled 2018-09-09 (×5): qty 2

## 2018-09-09 MED ORDER — FENTANYL CITRATE (PF) 100 MCG/2ML IJ SOLN
INTRAMUSCULAR | Status: AC
  Filled 2018-09-09: qty 2

## 2018-09-09 MED ORDER — OXYCODONE HCL 5 MG PO TABS
5.0000 mg | ORAL_TABLET | ORAL | Status: DC | PRN
  Administered 2018-09-09 – 2018-09-11 (×3): 5 mg via ORAL
  Filled 2018-09-09 (×3): qty 1

## 2018-09-09 MED ORDER — PRO-STAT SUGAR FREE PO LIQD
30.0000 mL | Freq: Every day | ORAL | Status: DC
  Administered 2018-09-10 – 2018-09-18 (×8): 30 mL via ORAL
  Filled 2018-09-09 (×10): qty 30

## 2018-09-09 MED ORDER — ADULT MULTIVITAMIN W/MINERALS CH
1.0000 | ORAL_TABLET | Freq: Every day | ORAL | Status: DC
  Administered 2018-09-09 – 2018-09-20 (×12): 1 via ORAL
  Filled 2018-09-09 (×12): qty 1

## 2018-09-09 MED ORDER — HYDRALAZINE HCL 25 MG PO TABS
25.0000 mg | ORAL_TABLET | Freq: Three times a day (TID) | ORAL | Status: DC
  Administered 2018-09-09 – 2018-09-11 (×7): 25 mg via ORAL
  Filled 2018-09-09 (×7): qty 1

## 2018-09-09 MED ORDER — MAGNESIUM SULFATE 2 GM/50ML IV SOLN
2.0000 g | Freq: Once | INTRAVENOUS | Status: AC
  Administered 2018-09-09: 2 g via INTRAVENOUS
  Filled 2018-09-09: qty 50

## 2018-09-09 NOTE — Procedures (Signed)
Liver mets, suspect colon ca  S/p [place] LEFT LIVER MASS CORE BX  No comp Stable EBL min Path pending Full report in pacs

## 2018-09-09 NOTE — Evaluation (Signed)
Physical Therapy Evaluation Patient Details Name: Carrie Chaney MRN: [id number] DOB: 1940/10/18 Today's Date: 09/09/2018   History of Present Illness  78 yo female admitted with progressive weakness, falls, bil LE edema, anasarca, CHF. 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 Hx of Dm, CHF.   Clinical Impression  On eval, pt required Mod-Min assist +2 safety for mobility. She walked ~50 feet with a RW. Pt presents with general weakness and impaired gait/balance. Mobility is also limited by significant abdominal pain. Discussed d/c plan-pt stated she is planning to return home.Will follow and progress activity as tolerated.     Follow Up Recommendations SNF;Supervision/Assistance - 24 hour(HHPT if pt progresses well and/or she declines placement)    Equipment Recommendations  Rolling walker with 5" wheels    Recommendations for Other Services       Precautions / Restrictions Precautions Precautions: Fall Restrictions Weight Bearing Restrictions: No      Mobility  Bed Mobility Overal bed mobility: Needs Assistance Bed Mobility: Supine to Sit     Supine to sit: Mod assist;HOB elevated     General bed mobility comments: Assist for trunk and bil LEs. Utilized bedpad to aid with scooting. Increased time. Cues for safety, technique.   Transfers Overall transfer level: Needs assistance Equipment used: Rolling walker (2 wheeled) Transfers: Sit to/from Stand Sit to Stand: Min assist;+2 physical assistance;+2 safety/equipment;From elevated surface         General transfer comment: Assist to rise, stabilize, control descent. VCs safety, technique, hand placement.   Ambulation/Gait Ambulation/Gait assistance: Min assist;+2 safety/equipment Gait Distance (Feet): 50 Feet Assistive device: Rolling walker (2 wheeled) Gait Pattern/deviations: Step-through pattern;Decreased stride length     General Gait Details: Very close guarding/small amount of assist  to stabilize throughout distance. Followed closely with recliner. Distance limited by abd pain.   Stairs            Wheelchair Mobility    Modified Rankin (Stroke Patients Only)       Balance Overall balance assessment: Needs assistance;History of Falls         Standing balance support: Bilateral upper extremity supported Standing balance-Leahy Scale: Poor                               Pertinent Vitals/Pain Pain Assessment: Faces Faces Pain Scale: Hurts whole lot Pain Location: abdomen with activity Pain Descriptors / Indicators: Grimacing;Discomfort Pain Intervention(s): Monitored during session;Repositioned    Home Living Family/patient expects to be discharged to:: Private residence Living Arrangements: Alone Available Help at Discharge: Family(nearby) Type of Home: House Home Access: Level entry(thru garage)     Home Layout: One level Home Equipment: Cane - single point;Grab bars - tub/shower;Grab bars - toilet      Prior Function Level of Independence: Independent with assistive device(s)         Comments: using cane ~1 week PTA. Performing ADLs/IADLS independently. Fairly active in the community     Hand Dominance        Extremity/Trunk Assessment   Upper Extremity Assessment Upper Extremity Assessment: Defer to OT evaluation    Lower Extremity Assessment Lower Extremity Assessment: Generalized weakness    Cervical / Trunk Assessment Cervical / Trunk Assessment: Normal  Communication   Communication: No difficulties  Cognition Arousal/Alertness: Awake/alert Behavior During Therapy: WFL for tasks assessed/performed Overall Cognitive Status: Within Functional Limits for tasks assessed  General Comments      Exercises     Assessment/Plan    PT Assessment Patient needs continued PT services  PT Problem List Decreased strength;Decreased balance;Decreased  mobility;Decreased activity tolerance;Decreased knowledge of use of DME;Pain;Obesity       PT Treatment Interventions DME instruction;Gait training;Therapeutic activities;Functional mobility training;Balance training;Patient/family education;Therapeutic exercise    PT Goals (Current goals can be found in the Care Plan section)  Acute Rehab PT Goals Patient Stated Goal: less pain. regain PLOF.  PT Goal Formulation: With patient Time For Goal Achievement: 09/23/18 Potential to Achieve Goals: Good    Frequency Min 3X/week   Barriers to discharge        Co-evaluation               AM-PAC PT "6 Clicks" Mobility  Outcome Measure Help needed turning from your back to your side while in a flat bed without using bedrails?: A Little Help needed moving from lying on your back to sitting on the side of a flat bed without using bedrails?: A Lot Help needed moving to and from a bed to a chair (including a wheelchair)?: A Lot Help needed standing up from a chair using your arms (e.g., wheelchair or bedside chair)?: A Lot Help needed to walk in hospital room?: A Lot Help needed climbing 3-5 steps with a railing? : Total 6 Click Score: 12    End of Session Equipment Utilized During Treatment: Gait belt Activity Tolerance: Patient limited by pain Patient left: in chair;with call bell/phone within reach   PT Visit Diagnosis: Muscle weakness (generalized) (M62.81);History of falling (Z91.81);Unsteadiness on feet (R26.81);Difficulty in walking, not elsewhere classified (R26.2);Pain Pain - part of body: (abdomen)    Time: 4388-8757 PT Time Calculation (min) (ACUTE ONLY): 24 min   Charges:   PT Evaluation $PT Eval Moderate Complexity: [place], PT Acute Rehabilitation Services Pager: [phone] Office: [phone]

## 2018-09-09 NOTE — Consult Note (Signed)
Chief Complaint: Patient was seen in consultation today for image guided liver lesion biopsy Chief Complaint  Patient presents with  . Leg Swelling    Referring Physician(s): Grunz,R  Supervising Physician: Daryll Brod  Patient Status: [facility] - In-pt  History of Present Illness: Carrie Chaney is a 78 y.o. female who presents for an US guided liver lesion biopsy. She presented to ED 2 days ago with 4 weeks of worsening lower extremity edema despite her PCP starting her on Bumex. Initial workup showed mild LFT elevations. She was admitted for anasarca, and a RUQ ultrasound was ordered, which showed multiple lesions on the liver. CT scan confirmed lesions, and biopsy was ordered.   Past Medical History:  Diagnosis Date  . Diabetes mellitus without complication ([place])   . Hyperlipemia     Past Surgical History:  Procedure Laterality Date  . ABDOMINAL HYSTERECTOMY      Allergies: Tomato  Medications: Prior to Admission medications   Medication Sig Start Date End Date Taking? Authorizing Provider  amLODipine (NORVASC) 10 MG tablet Take 10 mg by mouth daily.   Yes [provider]  atorvastatin (LIPITOR) 40 MG tablet Take 40 mg by mouth daily.   Yes [provider]  Azilsartan Medoxomil (EDARBI) 80 MG TABS Take 80 mg by mouth every morning.   Yes [provider]  bumetanide (BUMEX) 0.5 MG tablet Take 1 mg by mouth daily.   Yes [provider]     Family History  Problem Relation Age of Onset  . Hypertension Mother   . Diabetes Other   . Cancer Neg Hx     Social History   Socioeconomic History  . Marital status: Married    Spouse name: Not on file  . Number of children: Not on file  . Years of education: Not on file  . Highest education level: Not on file  Occupational History  . Not on file  Social Needs  . Financial resource strain: Not on file  . Food insecurity:    Worry: Not on file    Inability: Not on file  .  Transportation needs:    Medical: Not on file    Non-medical: Not on file  Tobacco Use  . Smoking status: Current Every Day Smoker  . Smokeless tobacco: Never Used  . Tobacco comment: one a day  Substance and Sexual Activity  . Alcohol use: No  . Drug use: No  . Sexual activity: Not on file  Lifestyle  . Physical activity:    Days per week: Not on file    Minutes per session: Not on file  . Stress: Not on file  Relationships  . Social connections:    Talks on phone: Not on file    Gets together: Not on file    Attends religious service: Not on file    Active member of club or organization: Not on file    Attends meetings of clubs or organizations: Not on file    Relationship status: Not on file  Other Topics Concern  . Not on file  Social History Narrative  . Not on file      Review of Systems  No headache, nausea, or vomiting. Patient states she has epigastric and RUQ pain since this morning at around 4am. Denies back pain, bleeding, dyspnea, and cough. Patient reports difficulty walking due to weakness, fatigue, and LE edema up to the knees.    Vital Signs: BP (!) 175/57 (BP Location: Right Arm)  Pulse (!) 54   Temp 98.1 F (36.7 C) (Oral)   Resp 20   Ht 5\' 8"  (1.727 m)   Wt 253 lb 15.5 oz (115.2 kg)   SpO2 94%   BMI 38.62 kg/m   Physical Exam  Patient is awake, alert, and resting comfortably in bed. Breath sounds were diminished bilaterally. Normal rate and rhythm with no murmurs/rubs. Abdomen was rigid, and tender in RUQ and epigastric area. Bilateral pitting edema was 3+ bilaterally.  Imaging: Dg Chest 2 View  Result Date: 09/07/2018 CLINICAL DATA:  Swelling of the left lower extremity for a month. Dyspnea. EXAM: CHEST - 2 VIEW COMPARISON:  None. FINDINGS: Mild cardiomegaly with aortic atherosclerosis. Central vascular congestion consistent with mild CHF is noted. Atelectasis is seen at the right lung base. IMPRESSION: Cardiomegaly with aortic  atherosclerosis. Central vascular congestion consistent with mild CHF. Electronically Signed   By: [name] M.D.   On: 09/07/2018 20:01   Ct Chest W Contrast  Result Date: 09/08/2018 CLINICAL DATA:  Liver masses on ultrasound earlier today.  Anasarca. EXAM: CT CHEST, ABDOMEN, AND PELVIS WITH CONTRAST TECHNIQUE: Multidetector CT imaging of the chest, abdomen and pelvis was performed following the standard protocol during bolus administration of intravenous contrast. CONTRAST:  159mL OMNIPAQUE IOHEXOL 300 MG/ML SOLN, 7mL OMNIPAQUE IOHEXOL 300 MG/ML SOLN COMPARISON:  Ultrasound exam 09/08/2018 FINDINGS: CT CHEST FINDINGS Cardiovascular: The heart is enlarged. Coronary artery calcification is evident. Atherosclerotic calcification is noted in the wall of the thoracic aorta. Mediastinum/Nodes: No mediastinal lymphadenopathy. There is no hilar lymphadenopathy. There is no axillary lymphadenopathy. The esophagus has normal imaging features. Lungs/Pleura: The central tracheobronchial airways are patent. Subsegmental atelectasis noted in the dependent lower lungs bilaterally. No suspicious pulmonary nodule or mass. No substantial pleural effusion. Musculoskeletal: No worrisome lytic or sclerotic osseous abnormality. CT ABDOMEN PELVIS FINDINGS Hepatobiliary: As seen on the recent ultrasound exam, there are multiple liver masses, bulky year in the right lobe and in the left. Index lesion in the medial right liver measures 6.9 x 5.0 cm on 54/2. Index lesion in the medial segment left liver measures 2.5 cm. A large exophytic lesion seen from the inferior right liver extends into Morison's pouch and generates substantial mass-effect on the right kidney. There is no evidence for gallstones, gallbladder wall thickening, or pericholecystic fluid. No intrahepatic or extrahepatic biliary dilation. Pancreas: No focal mass lesion. No dilatation of the main duct. No intraparenchymal cyst. No peripancreatic edema. Spleen: No  splenomegaly. No focal mass lesion. Adrenals/Urinary Tract: Right adrenal gland not visualized. Left adrenal gland unremarkable. 4.1 cm cyst noted in the upper pole left kidney. The mass in the inferior right liver extending down to the upper pole the right kidney does generate mass-effect on the right kidney but in some regions, appears to extend into the upper pole parenchyma of the right kidney (see coronal image 96 of series 5). 7 clear whether this is an exophytic mass arising from the upper right kidney or potentially hepatic origin invading the upper pole the right kidney. No evidence for hydroureter. The urinary bladder appears normal for the degree of distention. Stomach/Bowel: Stomach is unremarkable. No gastric wall thickening. No evidence of outlet obstruction. Duodenum is normally positioned as is the ligament of Treitz. No small bowel wall thickening. No small bowel dilatation. The terminal ileum is normal. The appendix is not visualized, but there is no edema or inflammation in the region of the cecum. Right colon and transverse colon are unremarkable. Diverticular changes are  noted in the left colon. 3.4 x 3.8 cm soft tissue mass is identified along the sigmoid colon (99/2). Vascular/Lymphatic: There is abdominal aortic atherosclerosis without aneurysm. IVC is displaced to the left by the inferior right hepatic disease. 1.6 cm short axis hepato duodenal ligament lymph node evident on 58/2. 1.9 cm lymph node in the porta hepatis visible on 62/2. 10 mm short axis aortocaval lymph node visible on 72/2. No pelvic sidewall lymphadenopathy. Reproductive: Uterus surgically absent.  There is no adnexal mass. Other: Small volume free fluid seen adjacent to the liver and spleen as well as in the cul-de-sac. 16 mm perigastric nodule noted on 67/2. Similar adjacent nodule measures up to 2.0 cm. Multiple omental nodules are evident including 2.0 x 1.4 cm left omental nodule on 88/2. Musculoskeletal: No worrisome  lytic or sclerotic osseous abnormality. IMPRESSION: 1. 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 2. Bulky metastatic disease is identified between the right liver in the upper pole the right kidney. This appears to involve both the liver and the kidney and is unclear whether this is primarily from the liver invading the upper pole the right kidney or potentially a right upper pole renal neoplasm invading the liver. 3. Perigastric, porta hepatis, and omental metastatic disease. Borderline enlarged retroperitoneal lymph nodes also suggest metastatic involvement. 4. 4 cm soft tissue mass associated with the sigmoid colon. While this could certainly be a primary colonic neoplasm, it has a severe coal configuration and does not have typical colorectal cancer CT imaging appearance. Given the metastatic disease elsewhere in the abdomen/pelvis, this could also represent metastatic disease in the pelvis. 5. Given the findings immediately above, no obvious primary source for the diffuse metastatic disease is evident. If the lesion along the sigmoid colon ends up being colorectal cancer, than the other findings in the abdomen and pelvis are certainly consistent with metastatic disease. Right adrenal or right renal cancer with direct liver invasion along with liver metastases, peritoneal/omental disease and metastatic lymphadenopathy would also be a consideration. 6. No evidence for metastatic disease in the thorax. 7. Aortic Atherosclerois (ICD10-170.0) Electronically Signed   By: Misty Stanley M.D.   On: 09/08/2018 18:30   Ct Abdomen Pelvis W Contrast  Result Date: 09/08/2018 CLINICAL DATA:  Liver masses on ultrasound earlier today.  Anasarca. EXAM: CT CHEST, ABDOMEN, AND PELVIS WITH CONTRAST TECHNIQUE: Multidetector CT imaging of the chest, abdomen and pelvis was performed following the standard protocol during bolus administration of intravenous contrast.  CONTRAST:  162mL OMNIPAQUE IOHEXOL 300 MG/ML SOLN, 31mL OMNIPAQUE IOHEXOL 300 MG/ML SOLN COMPARISON:  Ultrasound exam 09/08/2018 FINDINGS: CT CHEST FINDINGS Cardiovascular: The heart is enlarged. Coronary artery calcification is evident. Atherosclerotic calcification is noted in the wall of the thoracic aorta. Mediastinum/Nodes: No mediastinal lymphadenopathy. There is no hilar lymphadenopathy. There is no axillary lymphadenopathy. The esophagus has normal imaging features. Lungs/Pleura: The central tracheobronchial airways are patent. Subsegmental atelectasis noted in the dependent lower lungs bilaterally. No suspicious pulmonary nodule or mass. No substantial pleural effusion. Musculoskeletal: No worrisome lytic or sclerotic osseous abnormality. CT ABDOMEN PELVIS FINDINGS Hepatobiliary: As seen on the recent ultrasound exam, there are multiple liver masses, bulky year in the right lobe and in the left. Index lesion in the medial right liver measures 6.9 x 5.0 cm on 54/2. Index lesion in the medial segment left liver measures 2.5 cm. A large exophytic lesion seen from the inferior right liver extends into Morison's pouch and generates substantial  mass-effect on the right kidney. There is no evidence for gallstones, gallbladder wall thickening, or pericholecystic fluid. No intrahepatic or extrahepatic biliary dilation. Pancreas: No focal mass lesion. No dilatation of the main duct. No intraparenchymal cyst. No peripancreatic edema. Spleen: No splenomegaly. No focal mass lesion. Adrenals/Urinary Tract: Right adrenal gland not visualized. Left adrenal gland unremarkable. 4.1 cm cyst noted in the upper pole left kidney. The mass in the inferior right liver extending down to the upper pole the right kidney does generate mass-effect on the right kidney but in some regions, appears to extend into the upper pole parenchyma of the right kidney (see coronal image 96 of series 5). 7 clear whether this is an exophytic mass  arising from the upper right kidney or potentially hepatic origin invading the upper pole the right kidney. No evidence for hydroureter. The urinary bladder appears normal for the degree of distention. Stomach/Bowel: Stomach is unremarkable. No gastric wall thickening. No evidence of outlet obstruction. Duodenum is normally positioned as is the ligament of Treitz. No small bowel wall thickening. No small bowel dilatation. The terminal ileum is normal. The appendix is not visualized, but there is no edema or inflammation in the region of the cecum. Right colon and transverse colon are unremarkable. Diverticular changes are noted in the left colon. 3.4 x 3.8 cm soft tissue mass is identified along the sigmoid colon (99/2). Vascular/Lymphatic: There is abdominal aortic atherosclerosis without aneurysm. IVC is displaced to the left by the inferior right hepatic disease. 1.6 cm short axis hepato duodenal ligament lymph node evident on 58/2. 1.9 cm lymph node in the porta hepatis visible on 62/2. 10 mm short axis aortocaval lymph node visible on 72/2. No pelvic sidewall lymphadenopathy. Reproductive: Uterus surgically absent.  There is no adnexal mass. Other: Small volume free fluid seen adjacent to the liver and spleen as well as in the cul-de-sac. 16 mm perigastric nodule noted on 67/2. Similar adjacent nodule measures up to 2.0 cm. Multiple omental nodules are evident including 2.0 x 1.4 cm left omental nodule on 88/2. Musculoskeletal: No worrisome lytic or sclerotic osseous abnormality. IMPRESSION: 1. 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 2. Bulky metastatic disease is identified between the right liver in the upper pole the right kidney. This appears to involve both the liver and the kidney and is unclear whether this is primarily from the liver invading the upper pole the right kidney or potentially a right upper pole renal neoplasm invading the  liver. 3. Perigastric, porta hepatis, and omental metastatic disease. Borderline enlarged retroperitoneal lymph nodes also suggest metastatic involvement. 4. 4 cm soft tissue mass associated with the sigmoid colon. While this could certainly be a primary colonic neoplasm, it has a severe coal configuration and does not have typical colorectal cancer CT imaging appearance. Given the metastatic disease elsewhere in the abdomen/pelvis, this could also represent metastatic disease in the pelvis. 5. Given the findings immediately above, no obvious primary source for the diffuse metastatic disease is evident. If the lesion along the sigmoid colon ends up being colorectal cancer, than the other findings in the abdomen and pelvis are certainly consistent with metastatic disease. Right adrenal or right renal cancer with direct liver invasion along with liver metastases, peritoneal/omental disease and metastatic lymphadenopathy would also be a consideration. 6. No evidence for metastatic disease in the thorax. 7. Aortic Atherosclerois (ICD10-170.0) Electronically Signed   By: Misty Stanley M.D.   On: 09/08/2018 18:30  US Abdomen Limited Ruq  Result Date: 09/08/2018 CLINICAL DATA:  78 year old female with abnormal LFTs.  Anasarca. EXAM: ULTRASOUND ABDOMEN LIMITED RIGHT UPPER QUADRANT COMPARISON:  None. FINDINGS: Gallbladder: No gallstones or wall thickening visualized. No sonographic Murphy sign noted by sonographer. Common bile duct: Diameter: 4 millimeters, normal. Liver: Abnormal, highly irregular and heterogeneous liver echogenicity suggesting multiple liver masses ranging from 2.5 centimeters to 10.5 centimeters diameter (images 18, 35, 43). The liver contour seems to remain relatively smooth (image 31). Portal vein is patent on color Doppler imaging with normal direction of blood flow towards the liver. Other findings: No ascites. IMPRESSION: Positive for multiple liver masses without obvious cirrhosis. Favor  metastatic disease over primary liver tumor. CT chest abdomen and pelvis (oral and IV contrast preferred) recommended. Electronically Signed   By: Genevie Ann M.D.   On: 09/08/2018 02:20    Labs:  CBC: Recent Labs    09/07/18 2203 09/09/18 0645  WBC 14.0* 14.3*  HGB 11.3* 10.9*  HCT 37.2 36.0  PLT 317 293    COAGS: Recent Labs    09/08/18 0652  INR 1.04    BMP: Recent Labs    09/07/18 2203 09/08/18 0652 09/09/18 0645  NA 143 145 144  K 4.8 3.0* 3.0*  CL 104 105 103  CO2 30 32 31  GLUCOSE 135* 131* 167*  BUN 26* 23 22  CALCIUM 8.1* 8.1* 8.0*  CREATININE 0.96 0.94 0.91  GFRNONAA 57* 58* >60  GFRAA >60 >60 >60    LIVER FUNCTION TESTS: Recent Labs    09/07/18 2203 09/08/18 0652  BILITOT 2.2* 1.2  AST 59* 32  ALT 45* 47*  ALKPHOS 254* 239*  PROT 5.6* 5.5*  ALBUMIN 2.3* 2.3*    TUMOR MARKERS: No results for input(s): AFPTM, CEA, CA199, CHROMGRNA in the last 8760 hours.  Assessment and Plan: Anasarca and multiple hepatic lesions, retroperitoneal lymphadenopathy, and soft tissue mass associated with sigmoid colon, unknown primary. Patient is to undergo US guided liver biopsy today. Imaging studies reviewed by Dr. Annamaria Boots. Risks and benefits of procedure was discussed with the patient  including, but not limited to bleeding, infection, damage to adjacent structures or low yield requiring additional tests.  All of the questions were answered and there is agreement to proceed.  Consent signed and in chart.   Thank you for this interesting consult.  I greatly enjoyed meeting Koraline Phillipson and look forward to participating in their care.  A copy of this report was sent to the requesting provider on this date.  Electronically Signed: D. Rowe Robert, PA-C/Maxwell Leland Johns, PA-S 09/09/2018, 9:43 AM   I spent a total of 25 minutes in face to face in clinical consultation, greater than 50% of which was counseling/coordinating care for US guided liver lesion  biopsy.

## 2018-09-09 NOTE — Progress Notes (Signed)
PROGRESS NOTE  Carrie Chaney  XNA:[id number] DOB: July 30, 1940 DOA: 09/07/2018 PCP: Lucianne Lei, MD   Brief Narrative: Carrie Chaney is a 78 y.o. female with a history of HTN, HLD who presented for 4 weeks of progressive lower extremity swelling which continued despite starting bumex per PCP. This has caused limited mobility. CXR showed cardiomegaly, pulmonary congestion, albumin 2.3 with mild LFT elevations. BNP 299, CK 86, WBC 14k, creatinine 0.9, BUN 26. She was admitted for anasarca, started on IV diuretic, and work up included RUQ U/S which demonstrated multiple masses in the liver. Further work up is underway.  Assessment & Plan: Principal Problem:   Anasarca Active Problems:   Hypertension   Elevated liver enzymes   Proximal limb muscle weakness   Abnormal LFTs   Weakness  Anasarca, acute on chronic HFpEF: Grossly fluid overloaded based on CXR pulm edema and exam with significant pitting edema. Reported dry weight ~220lbs per patient and currently 255lbs. Suspect hypoalbuminemia and diastolic dysfunction on echo are primary contributors. Would expect prolonged PT/INR if hepatic synthetic capacity was truly decreased. No proteinuria noted. TSH wnl. Suspect BNP of 299 is suppressed due to obesity. - Insignificant UOP reported, though it is also incompletely charted. This was discussed with RN for strict I&O. Will increase lasix to 40mg  IV BID. Cr stable, weight down 2lbs. Again, would not expect po diuretic to be effective with diffuse, and presumed intestinal, edema.  Metastatic cancer of unknown primary: Bulky metastatic disease in liver, essentially replaced right inferior liver on CT, extending to right kidney superior pole, also involving perigastric, portahepatis, and omentum.  - IR consulted for biopsy of liver lesion. Plan to involve oncology once tissue diagnosis is reached. This has been discussed with the patient.  - Check CEA, AFP, CA 19-9. No adnexal or pancreatic  mass. - Mildly elevated LFTs. Will trend.  Sigmoid colon mass: 4cm on CT, per radiology, not typical appearance of colorectal primary. Reports cologuard screening "clear" 1 year ago.  - Reached out through EMR to PCP for colon cancer screening records. - Tumor markers as above  Abdominal pain: Multifactorial, likely related to degree of distention with bulky metastatic disease and anasarca. Also in consideration with loose stools, and leukocytosis, is colitis, though this did not appear on recent CT abd/pelvis.  - Will treat with oxyIR (tolerated and effective for prior pains) - Monitor stool output. Hesitant to give antimotility with colitis not ruled out.   HTN:  - Hold norvasc for now. Could theoretically contribute to edema. Replace with hydralazine.   Hypokalemia:  - Supplement TID (up from BID) and recheck in AM with loop diuretic administration/augmentation.   Obesity: BMI 38, though due expect much is fluid weight as above. Though still will qualify for obesity. - Weight loss is recommended.  Hyperlipidemia:  - Hold statin  Proximal muscle weakness: CPK nl, TSH nl.  - PT/OT  DVT prophylaxis: Lovenox Code Status: Full Family Communication: None at bedside Disposition Plan: Uncertain, reporting significant weakness. She remains with anasarca that I anticipate will require several days of diuresis.  Consultants:   IR   Procedures:   Echocardiogram 09/08/2018:  1. The left ventricle has hyperdynamic systolic function, with an ejection fraction of >65%. The cavity size was normal. There is moderately increased left ventricular wall thickness. Left ventricular diastolic Doppler parameters are consistent with  impaired relaxation.  2. The right ventricle has normal systolic function. The cavity was mildly enlarged. There is no increase in right ventricular wall thickness.  3. The mitral valve is normal in structure.  4. The tricuspid valve is normal in structure.  5. The  aortic valve is normal in structure.  6. The pulmonic valve was normal in structure.  Antimicrobials:  None   Subjective: Continues to feel weak, notes some improvement in leg swelling, but feels abdominal bloating and pain worsened. She's had 2 episodes of loose stools this morning, dark without blood, not completely watery.   Objective: Vitals:   09/08/18 0500 09/08/18 1337 09/08/18 1959 09/09/18 0518  BP:  (!) 157/46 (!) 153/57 (!) 175/57  Pulse:  64 69 (!) 54  Resp:  16 20 20   Temp:  98.4 F (36.9 C) 98.3 F (36.8 C) 98.1 F (36.7 C)  TempSrc:   Oral Oral  SpO2:  97% 91% 94%  Weight: 116.1 kg   115.2 kg  Height:        Intake/Output Summary (Last 24 hours) at 09/09/2018 1121 Last data filed at 09/08/2018 1148 Gross per 24 hour  Intake -  Output 50 ml  Net -50 ml   Filed Weights   09/08/18 0045 09/08/18 0500 09/09/18 0518  Weight: 116.1 kg 116.1 kg 115.2 kg   Gen: 78 y.o. female in no distress Pulm: Nonlabored breathing room air. continues to have bibasilar crackles. CV: Regular rate and rhythm. No murmur, rub, or gallop. Equivocal JVD, 3+ pitting dependent edema extending to lower abdomen GI: Abdomen soft, tender throughout without rebound, some tenderness in RUQ, distended, with normoactive bowel sounds.  Ext: Warm, no deformities Skin: No rashes, lesions or ulcers on visualized skin. Neuro: Alert and oriented. No focal neurological deficits. Psych: Judgement and insight appear fair. Mood euthymic & affect congruent. Behavior is appropriate.    Data Reviewed: I have personally reviewed following labs and imaging studies  CBC: Recent Labs  Lab 09/07/18 2203 09/09/18 0645  WBC 14.0* 14.3*  HGB 11.3* 10.9*  HCT 37.2 36.0  MCV 93.5 94.2  PLT 317 734   Basic Metabolic Panel: Recent Labs  Lab 09/07/18 2203 09/08/18 0652 09/09/18 0645  NA 143 145 144  K 4.8 3.0* 3.0*  CL 104 105 103  CO2 30 32 31  GLUCOSE 135* 131* 167*  BUN 26* 23 22  CREATININE  0.96 0.94 0.91  CALCIUM 8.1* 8.1* 8.0*  MG  --  2.0  --   PHOS  --  2.8  --    GFR: Estimated Creatinine Clearance: 67.9 mL/min (by C-G formula based on SCr of 0.91 mg/dL). Liver Function Tests: Recent Labs  Lab 09/07/18 2203 09/08/18 0652  AST 59* 32  ALT 45* 47*  ALKPHOS 254* 239*  BILITOT 2.2* 1.2  PROT 5.6* 5.5*  ALBUMIN 2.3* 2.3*   No results for input(s): LIPASE, AMYLASE in the last 168 hours. No results for input(s): AMMONIA in the last 168 hours. Coagulation Profile: Recent Labs  Lab 09/08/18 0652  INR 1.04   Cardiac Enzymes: Recent Labs  Lab 09/07/18 2308  CKTOTAL 86   BNP (last 3 results) No results for input(s): PROBNP in the last 8760 hours. HbA1C: No results for input(s): HGBA1C in the last 72 hours. CBG: No results for input(s): GLUCAP in the last 168 hours. Lipid Profile: No results for input(s): CHOL, HDL, LDLCALC, TRIG, CHOLHDL, LDLDIRECT in the last 72 hours. Thyroid Function Tests: Recent Labs    09/08/18 0652  TSH 2.173   Anemia Panel: No results for input(s): VITAMINB12, FOLATE, FERRITIN, TIBC, IRON, RETICCTPCT in the last 72 hours. Urine  analysis:    Component Value Date/Time   COLORURINE YELLOW 09/08/2018 [place] 09/08/2018 0447   LABSPEC 1.008 09/08/2018 0447   PHURINE 7.0 09/08/2018 [place] 09/08/2018 0447   HGBUR NEGATIVE 09/08/2018 [place] 09/08/2018 [place] 09/08/2018 0447   PROTEINUR NEGATIVE 09/08/2018 0447   NITRITE NEGATIVE 09/08/2018 0447   LEUKOCYTESUR NEGATIVE 09/08/2018 0447   No results found for this or any previous visit (from the past 240 hour(s)).    Radiology Studies: Dg Chest 2 View  Result Date: 09/07/2018 CLINICAL DATA:  Swelling of the left lower extremity for a month. Dyspnea. EXAM: CHEST - 2 VIEW COMPARISON:  None. FINDINGS: Mild cardiomegaly with aortic atherosclerosis. Central vascular congestion consistent with mild CHF is  noted. Atelectasis is seen at the right lung base. IMPRESSION: Cardiomegaly with aortic atherosclerosis. Central vascular congestion consistent with mild CHF. Electronically Signed   By: [name] M.D.   On: 09/07/2018 20:01   Ct Chest W Contrast  Result Date: 09/08/2018 CLINICAL DATA:  Liver masses on ultrasound earlier today.  Anasarca. EXAM: CT CHEST, ABDOMEN, AND PELVIS WITH CONTRAST TECHNIQUE: Multidetector CT imaging of the chest, abdomen and pelvis was performed following the standard protocol during bolus administration of intravenous contrast. CONTRAST:  166mL OMNIPAQUE IOHEXOL 300 MG/ML SOLN, 76mL OMNIPAQUE IOHEXOL 300 MG/ML SOLN COMPARISON:  Ultrasound exam 09/08/2018 FINDINGS: CT CHEST FINDINGS Cardiovascular: The heart is enlarged. Coronary artery calcification is evident. Atherosclerotic calcification is noted in the wall of the thoracic aorta. Mediastinum/Nodes: No mediastinal lymphadenopathy. There is no hilar lymphadenopathy. There is no axillary lymphadenopathy. The esophagus has normal imaging features. Lungs/Pleura: The central tracheobronchial airways are patent. Subsegmental atelectasis noted in the dependent lower lungs bilaterally. No suspicious pulmonary nodule or mass. No substantial pleural effusion. Musculoskeletal: No worrisome lytic or sclerotic osseous abnormality. CT ABDOMEN PELVIS FINDINGS Hepatobiliary: As seen on the recent ultrasound exam, there are multiple liver masses, bulky year in the right lobe and in the left. Index lesion in the medial right liver measures 6.9 x 5.0 cm on 54/2. Index lesion in the medial segment left liver measures 2.5 cm. A large exophytic lesion seen from the inferior right liver extends into Morison's pouch and generates substantial mass-effect on the right kidney. There is no evidence for gallstones, gallbladder wall thickening, or pericholecystic fluid. No intrahepatic or extrahepatic biliary dilation. Pancreas: No focal mass lesion. No  dilatation of the main duct. No intraparenchymal cyst. No peripancreatic edema. Spleen: No splenomegaly. No focal mass lesion. Adrenals/Urinary Tract: Right adrenal gland not visualized. Left adrenal gland unremarkable. 4.1 cm cyst noted in the upper pole left kidney. The mass in the inferior right liver extending down to the upper pole the right kidney does generate mass-effect on the right kidney but in some regions, appears to extend into the upper pole parenchyma of the right kidney (see coronal image 96 of series 5). 7 clear whether this is an exophytic mass arising from the upper right kidney or potentially hepatic origin invading the upper pole the right kidney. No evidence for hydroureter. The urinary bladder appears normal for the degree of distention. Stomach/Bowel: Stomach is unremarkable. No gastric wall thickening. No evidence of outlet obstruction. Duodenum is normally positioned as is the ligament of Treitz. No small bowel wall thickening. No small bowel dilatation. The terminal ileum is normal. The appendix is not visualized, but there is no edema or inflammation in the region of the cecum.  Right colon and transverse colon are unremarkable. Diverticular changes are noted in the left colon. 3.4 x 3.8 cm soft tissue mass is identified along the sigmoid colon (99/2). Vascular/Lymphatic: There is abdominal aortic atherosclerosis without aneurysm. IVC is displaced to the left by the inferior right hepatic disease. 1.6 cm short axis hepato duodenal ligament lymph node evident on 58/2. 1.9 cm lymph node in the porta hepatis visible on 62/2. 10 mm short axis aortocaval lymph node visible on 72/2. No pelvic sidewall lymphadenopathy. Reproductive: Uterus surgically absent.  There is no adnexal mass. Other: Small volume free fluid seen adjacent to the liver and spleen as well as in the cul-de-sac. 16 mm perigastric nodule noted on 67/2. Similar adjacent nodule measures up to 2.0 cm. Multiple omental nodules are  evident including 2.0 x 1.4 cm left omental nodule on 88/2. Musculoskeletal: No worrisome lytic or sclerotic osseous abnormality. IMPRESSION: 1. 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 2. Bulky metastatic disease is identified between the right liver in the upper pole the right kidney. This appears to involve both the liver and the kidney and is unclear whether this is primarily from the liver invading the upper pole the right kidney or potentially a right upper pole renal neoplasm invading the liver. 3. Perigastric, porta hepatis, and omental metastatic disease. Borderline enlarged retroperitoneal lymph nodes also suggest metastatic involvement. 4. 4 cm soft tissue mass associated with the sigmoid colon. While this could certainly be a primary colonic neoplasm, it has a severe coal configuration and does not have typical colorectal cancer CT imaging appearance. Given the metastatic disease elsewhere in the abdomen/pelvis, this could also represent metastatic disease in the pelvis. 5. Given the findings immediately above, no obvious primary source for the diffuse metastatic disease is evident. If the lesion along the sigmoid colon ends up being colorectal cancer, than the other findings in the abdomen and pelvis are certainly consistent with metastatic disease. Right adrenal or right renal cancer with direct liver invasion along with liver metastases, peritoneal/omental disease and metastatic lymphadenopathy would also be a consideration. 6. No evidence for metastatic disease in the thorax. 7. Aortic Atherosclerois (ICD10-170.0) Electronically Signed   By: Misty Stanley M.D.   On: 09/08/2018 18:30   Ct Abdomen Pelvis W Contrast  Result Date: 09/08/2018 CLINICAL DATA:  Liver masses on ultrasound earlier today.  Anasarca. EXAM: CT CHEST, ABDOMEN, AND PELVIS WITH CONTRAST TECHNIQUE: Multidetector CT imaging of the chest, abdomen and pelvis was  performed following the standard protocol during bolus administration of intravenous contrast. CONTRAST:  128mL OMNIPAQUE IOHEXOL 300 MG/ML SOLN, 87mL OMNIPAQUE IOHEXOL 300 MG/ML SOLN COMPARISON:  Ultrasound exam 09/08/2018 FINDINGS: CT CHEST FINDINGS Cardiovascular: The heart is enlarged. Coronary artery calcification is evident. Atherosclerotic calcification is noted in the wall of the thoracic aorta. Mediastinum/Nodes: No mediastinal lymphadenopathy. There is no hilar lymphadenopathy. There is no axillary lymphadenopathy. The esophagus has normal imaging features. Lungs/Pleura: The central tracheobronchial airways are patent. Subsegmental atelectasis noted in the dependent lower lungs bilaterally. No suspicious pulmonary nodule or mass. No substantial pleural effusion. Musculoskeletal: No worrisome lytic or sclerotic osseous abnormality. CT ABDOMEN PELVIS FINDINGS Hepatobiliary: As seen on the recent ultrasound exam, there are multiple liver masses, bulky year in the right lobe and in the left. Index lesion in the medial right liver measures 6.9 x 5.0 cm on 54/2. Index lesion in the medial segment left liver measures 2.5 cm. A large exophytic lesion seen from the  inferior right liver extends into Morison's pouch and generates substantial mass-effect on the right kidney. There is no evidence for gallstones, gallbladder wall thickening, or pericholecystic fluid. No intrahepatic or extrahepatic biliary dilation. Pancreas: No focal mass lesion. No dilatation of the main duct. No intraparenchymal cyst. No peripancreatic edema. Spleen: No splenomegaly. No focal mass lesion. Adrenals/Urinary Tract: Right adrenal gland not visualized. Left adrenal gland unremarkable. 4.1 cm cyst noted in the upper pole left kidney. The mass in the inferior right liver extending down to the upper pole the right kidney does generate mass-effect on the right kidney but in some regions, appears to extend into the upper pole parenchyma of the  right kidney (see coronal image 96 of series 5). 7 clear whether this is an exophytic mass arising from the upper right kidney or potentially hepatic origin invading the upper pole the right kidney. No evidence for hydroureter. The urinary bladder appears normal for the degree of distention. Stomach/Bowel: Stomach is unremarkable. No gastric wall thickening. No evidence of outlet obstruction. Duodenum is normally positioned as is the ligament of Treitz. No small bowel wall thickening. No small bowel dilatation. The terminal ileum is normal. The appendix is not visualized, but there is no edema or inflammation in the region of the cecum. Right colon and transverse colon are unremarkable. Diverticular changes are noted in the left colon. 3.4 x 3.8 cm soft tissue mass is identified along the sigmoid colon (99/2). Vascular/Lymphatic: There is abdominal aortic atherosclerosis without aneurysm. IVC is displaced to the left by the inferior right hepatic disease. 1.6 cm short axis hepato duodenal ligament lymph node evident on 58/2. 1.9 cm lymph node in the porta hepatis visible on 62/2. 10 mm short axis aortocaval lymph node visible on 72/2. No pelvic sidewall lymphadenopathy. Reproductive: Uterus surgically absent.  There is no adnexal mass. Other: Small volume free fluid seen adjacent to the liver and spleen as well as in the cul-de-sac. 16 mm perigastric nodule noted on 67/2. Similar adjacent nodule measures up to 2.0 cm. Multiple omental nodules are evident including 2.0 x 1.4 cm left omental nodule on 88/2. Musculoskeletal: No worrisome lytic or sclerotic osseous abnormality. IMPRESSION: 1. 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 2. Bulky metastatic disease is identified between the right liver in the upper pole the right kidney. This appears to involve both the liver and the kidney and is unclear whether this is primarily from the liver invading the  upper pole the right kidney or potentially a right upper pole renal neoplasm invading the liver. 3. Perigastric, porta hepatis, and omental metastatic disease. Borderline enlarged retroperitoneal lymph nodes also suggest metastatic involvement. 4. 4 cm soft tissue mass associated with the sigmoid colon. While this could certainly be a primary colonic neoplasm, it has a severe coal configuration and does not have typical colorectal cancer CT imaging appearance. Given the metastatic disease elsewhere in the abdomen/pelvis, this could also represent metastatic disease in the pelvis. 5. Given the findings immediately above, no obvious primary source for the diffuse metastatic disease is evident. If the lesion along the sigmoid colon ends up being colorectal cancer, than the other findings in the abdomen and pelvis are certainly consistent with metastatic disease. Right adrenal or right renal cancer with direct liver invasion along with liver metastases, peritoneal/omental disease and metastatic lymphadenopathy would also be a consideration. 6. No evidence for metastatic disease in the thorax. 7. Aortic Atherosclerois (ICD10-170.0) Electronically Signed   By:  Misty Stanley M.D.   On: 09/08/2018 18:30   US Abdomen Limited Ruq  Result Date: 09/08/2018 CLINICAL DATA:  78 year old female with abnormal LFTs.  Anasarca. EXAM: ULTRASOUND ABDOMEN LIMITED RIGHT UPPER QUADRANT COMPARISON:  None. FINDINGS: Gallbladder: No gallstones or wall thickening visualized. No sonographic Murphy sign noted by sonographer. Common bile duct: Diameter: 4 millimeters, normal. Liver: Abnormal, highly irregular and heterogeneous liver echogenicity suggesting multiple liver masses ranging from 2.5 centimeters to 10.5 centimeters diameter (images 18, 35, 43). The liver contour seems to remain relatively smooth (image 31). Portal vein is patent on color Doppler imaging with normal direction of blood flow towards the liver. Other findings: No  ascites. IMPRESSION: Positive for multiple liver masses without obvious cirrhosis. Favor metastatic disease over primary liver tumor. CT chest abdomen and pelvis (oral and IV contrast preferred) recommended. Electronically Signed   By: Genevie Ann M.D.   On: 09/08/2018 02:20    Scheduled Meds: . [START ON 09/10/2018] enoxaparin (LOVENOX) injection  40 mg Subcutaneous Daily  . furosemide  40 mg Intravenous Daily  . irbesartan  300 mg Oral Daily  . potassium chloride  40 mEq Oral BID  . sodium chloride flush  3 mL Intravenous Q12H   Continuous Infusions: . sodium chloride       LOS: 1 day   Time spent: 35 minutes.  Patrecia Pour, MD [facility] [email or web address] Password [facility] 09/09/2018, 11:21 AM

## 2018-09-09 NOTE — Evaluation (Signed)
Occupational Therapy Evaluation Patient Details Name: Carrie Chaney MRN: [id number] DOB: Nov 19, 1940 Today's Date: 09/09/2018    History of Present Illness 78 yo female admitted with progressive weakness, falls, bil LE edema, anasarca, CHF. 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 Hx of Dm, CHF.    Clinical Impression   PT admitted with see above. Pt currently with functional limitiations due to the deficits listed below (see OT problem list). Pt currently total +2 min/ mod (A) for sit <>Stand and max (A) for LB at this time. Pt agreeable to SNF. Pt has family nearby but lives alone.  Pt will benefit from skilled OT to increase their independence and safety with adls and balance to allow discharge SNF.     Follow Up Recommendations  SNF    Equipment Recommendations  Other (comment)(RW)    Recommendations for Other Services       Precautions / Restrictions Precautions Precautions: Fall Restrictions Weight Bearing Restrictions: No      Mobility Bed Mobility Overal bed mobility: Needs Assistance Bed Mobility: Supine to Sit     Supine to sit: Mod assist;HOB elevated     General bed mobility comments: Assist for trunk and bil LEs. Utilized bedpad to aid with scooting. Increased time. Cues for safety, technique. MOd vc to reach with L UE toward bed rail and to push up with R forearm to elbow. pt required (A) for BIL LE off bed surface due to edema   Transfers Overall transfer level: Needs assistance Equipment used: Rolling walker (2 wheeled) Transfers: Sit to/from Stand Sit to Stand: Min assist;+2 physical assistance;+2 safety/equipment;From elevated surface         General transfer comment: Assist to rise, stabilize, control descent. VCs safety, technique, hand placement.     Balance Overall balance assessment: Needs assistance;History of Falls         Standing balance support: Bilateral upper extremity supported Standing balance-Leahy  Scale: Poor Standing balance comment: reliance on RW                           ADL either performed or assessed with clinical judgement   ADL Overall ADL's : Needs assistance/impaired Eating/Feeding: Modified independent   Grooming: Wash/dry face   Upper Body Bathing: Minimal assistance   Lower Body Bathing: Maximal assistance   Upper Body Dressing : Minimal assistance   Lower Body Dressing: Maximal assistance   Toilet Transfer: +2 for physical assistance;Moderate assistance           Functional mobility during ADLs: +2 for physical assistance;Minimal assistance       Vision         Perception     Praxis      Pertinent Vitals/Pain Pain Assessment: Faces Faces Pain Scale: Hurts whole lot Pain Location: abdomen with activity Pain Descriptors / Indicators: Grimacing;Discomfort Pain Intervention(s): Monitored during session;Premedicated before session;Repositioned     Hand Dominance Right   Extremity/Trunk Assessment Upper Extremity Assessment Upper Extremity Assessment: RUE deficits/detail RUE Deficits / Details: edema noted and pt reports from IV in forearm. braclets moved toward wrist due to edema and noted pressure on skin from objects. Pt with R UE elevated for edema management    Lower Extremity Assessment Lower Extremity Assessment: RLE deficits/detail;LLE deficits/detail RLE Deficits / Details: edema noted LLE Deficits / Details: edema noted   Cervical / Trunk Assessment Cervical / Trunk Assessment: Other exceptions;Normal Cervical / Trunk Exceptions: complaining of abdominal pain and forward  flexed due to discomfort   Communication Communication Communication: No difficulties   Cognition Arousal/Alertness: Awake/alert Behavior During Therapy: WFL for tasks assessed/performed Overall Cognitive Status: Within Functional Limits for tasks assessed                                     General Comments  pt c/o increasing  abdominal pain during session. pt reports no pain in LB    Exercises     Shoulder Instructions      Home Living Family/patient expects to be discharged to:: Private residence Living Arrangements: Alone Available Help at Discharge: Family(nearby) Type of Home: House Home Access: Level entry(thru garage)     Home Layout: One level     Bathroom Shower/Tub: Walk-in shower;Door   Bathroom Toilet: Handicapped height     Home Equipment: Cane - single point;Grab bars - tub/shower;Grab bars - toilet   Additional Comments: very involved in multiple committees       Prior Functioning/Environment Level of Independence: Independent with assistive device(s)        Comments: using cane ~1 week PTA. Performing ADLs/IADLS independently. Fairly active in the community        OT Problem List: Decreased strength;Decreased range of motion;Decreased activity tolerance;Impaired balance (sitting and/or standing);Decreased safety awareness;Decreased knowledge of use of DME or AE;Decreased knowledge of precautions;Cardiopulmonary status limiting activity;Impaired sensation;Obesity;Impaired UE functional use;Pain;Increased edema      OT Treatment/Interventions: Self-care/ADL training;Therapeutic exercise;Neuromuscular education;Energy conservation;DME and/or AE instruction;Manual therapy;Modalities;Therapeutic activities;Patient/family education;Balance training    OT Goals(Current goals can be found in the care plan section) Acute Rehab OT Goals Patient Stated Goal: pain in stomach to stop OT Goal Formulation: With patient Time For Goal Achievement: 09/23/18 Potential to Achieve Goals: Good  OT Frequency: Min 2X/week   Barriers to D/C: Decreased caregiver support  lives alone but with family on the same street       Co-evaluation              AM-PAC OT "6 Clicks" Daily Activity     Outcome Measure Help from another person eating meals?: None Help from another person taking care  of personal grooming?: None Help from another person toileting, which includes using toliet, bedpan, or urinal?: A Lot Help from another person bathing (including washing, rinsing, drying)?: A Lot Help from another person to put on and taking off regular upper body clothing?: A Little Help from another person to put on and taking off regular lower body clothing?: A Lot 6 Click Score: 17   End of Session Equipment Utilized During Treatment: Gait belt;Rolling walker Nurse Communication: Mobility status;Precautions  Activity Tolerance: Patient tolerated treatment well Patient left: in chair;with call bell/phone within reach;with nursing/sitter in room(RN students applying pure wick)  OT Visit Diagnosis: Unsteadiness on feet (R26.81);Muscle weakness (generalized) (M62.81)                Time: 8270-7867 OT Time Calculation (min): 23 min Charges:  OT General Charges $OT Visit: 1 Visit OT Evaluation $OT Eval Moderate Complexity: 1 Mod   Jeri Modena, OTR/L  Acute Rehabilitation Services Pager: [phone] Office: [phone] .   Jeri Modena 09/09/2018, 2:22 PM

## 2018-09-09 NOTE — Progress Notes (Signed)
Initial Nutrition Assessment  DOCUMENTATION CODES:   Obesity unspecified  INTERVENTION:  - Will order 30 mL Prostat once/day, each supplement provides 100 kcal and 15 grams of protein. - Will order daily multivitamin with minerals.  - Continue to encourage PO intakes.    NUTRITION DIAGNOSIS:   Inadequate oral intake related to acute illness as evidenced by per patient/family report.  GOAL:   Patient will meet greater than or equal to 90% of their needs  MONITOR:   PO intake, Supplement acceptance, Weight trends, Labs, I & O's  REASON FOR ASSESSMENT:   Consult Assessment of nutrition requirement/status  ASSESSMENT:   78 y.o. female with a history of HTN and HLD. She presented to the ED via EMS 4 weeks of progressive lower extremity swelling which continued despite starting bumex per PCP. This has caused limited mobility. CXR showed cardiomegaly, pulmonary congestion, albumin 2.3 with mild LFT elevations. She was admitted for anasarca, started on IV diuretic, and work up included RUQ U/S which demonstrated multiple masses in the liver. Further work up is underway.  No intakes documented since admission. Patient reports 7/10 abdominal pain which began at 4:00-4:30 AM today. She denies this being present PTA. Patient and sister, who is at bedside, report a conscious effort over the past 3-5 years to limit salt intake. Patient uses fresh herbs and spices to season foods, typically avoids pre-packed foods and canned foods, and is knowledgeable about fresh foods that contain higher amounts of sodium.   She typically only eats one meal/day which is at dinner time. This meal is often a meat and vegetables. She will later have a snack that is cereal or a smoothie made with yogurt and fresh fruit.   Talked with patient about increasing protein intake throughout the day. Encouraged her to have protein 3 times/day and provided examples of items patient may enjoy based on her reported food  preferences.   Patient reports decreased appetite and intakes over the past 3 weeks d/t limited mobility and no wanting to have to go to the bathroom.   Per chart review, current weight is 254 lb and patient reported UBW of 220-225 lb.   Medications reviewed; 40 mg IV lasix BID, 2 g IV Mg sulfate x1 run 2/21, 40 mEq oral KCl TID. Labs reviewed; K: 3 mmol/l, Ca: 8 mg/dl.     NUTRITION - FOCUSED PHYSICAL EXAM:  Completed; no muscle and no fat wasting; severe edema to BLE.   Diet Order:   Diet Order            Diet Heart Room service appropriate? Yes; Fluid consistency: Thin  Diet effective now              EDUCATION NEEDS:   Education needs have been addressed  Skin:  Skin Assessment: Reviewed RN Assessment  Last BM:  2/21  Height:   Ht Readings from Last 1 Encounters:  09/08/18 5\' 8"  (1.727 m)    Weight:   Wt Readings from Last 1 Encounters:  09/09/18 115.2 kg    Ideal Body Weight:  63.64 kg  BMI:  Body mass index is 38.62 kg/m.  Estimated Nutritional Needs:   Kcal:  1500-1700 kcal  Protein:  70-80 grams  Fluid:  per MD     Jarome Matin, MS, RD, LDN, CNSC Inpatient Clinical Dietitian Pager # [phone] After hours/weekend pager # [phone]

## 2018-09-09 NOTE — Progress Notes (Signed)
Notified by tele. New run SVT. HR 155. Non sustaining. Vitals 114/50. 91% o2. HR down to 78.  Asymptomatic. Will notified on call. Will continue to monitor.

## 2018-09-10 LAB — CBC
HCT: 37.8 % (ref 36.0–46.0)
HEMOGLOBIN: 11.6 g/dL — AB (ref 12.0–15.0)
MCH: 28.6 pg (ref 26.0–34.0)
MCHC: 30.7 g/dL (ref 30.0–36.0)
MCV: 93.1 fL (ref 80.0–100.0)
Platelets: 315 10*3/uL (ref 150–400)
RBC: 4.06 MIL/uL (ref 3.87–5.11)
RDW: 17.7 % — ABNORMAL HIGH (ref 11.5–15.5)
WBC: 16.7 10*3/uL — ABNORMAL HIGH (ref 4.0–10.5)
nRBC: 1 % — ABNORMAL HIGH (ref 0.0–0.2)

## 2018-09-10 LAB — BASIC METABOLIC PANEL
ANION GAP: 8 (ref 5–15)
BUN: 29 mg/dL — ABNORMAL HIGH (ref 8–23)
CO2: 31 mmol/L (ref 22–32)
Calcium: 8.1 mg/dL — ABNORMAL LOW (ref 8.9–10.3)
Chloride: 106 mmol/L (ref 98–111)
Creatinine, Ser: 1.15 mg/dL — ABNORMAL HIGH (ref 0.44–1.00)
GFR calc Af Amer: 53 mL/min — ABNORMAL LOW (ref >60)
GFR calc non Af Amer: 46 mL/min — ABNORMAL LOW (ref >60)
Glucose, Bld: 152 mg/dL — ABNORMAL HIGH (ref 70–99)
Potassium: 3.9 mmol/L (ref 3.5–5.1)
Sodium: 145 mmol/L (ref 135–145)

## 2018-09-10 LAB — MAGNESIUM: Magnesium: 2.1 mg/dL (ref 1.7–2.4)

## 2018-09-10 LAB — CANCER ANTIGEN 19-9: CA 19-9: 1 U/mL (ref 0–35)

## 2018-09-10 LAB — CEA: CEA: 10.1 ng/mL — ABNORMAL HIGH (ref 0.0–4.7)

## 2018-09-10 LAB — AFP TUMOR MARKER: AFP, Serum, Tumor Marker: 2.3 ng/mL (ref 0.0–8.3)

## 2018-09-10 NOTE — Progress Notes (Signed)
PROGRESS NOTE  Carrie Chaney  CBS:[id number] DOB: 07-May-1941 DOA: 09/07/2018 PCP: Lucianne Lei, MD   Brief Narrative: Carrie Chaney is a 78 y.o. female with a history of HTN, HLD who presented for 4 weeks of progressive lower extremity swelling which continued despite starting bumex per PCP. This has caused limited mobility. CXR showed cardiomegaly, pulmonary congestion, albumin 2.3 with mild LFT elevations. BNP 299, CK 86, WBC 14k, creatinine 0.9, BUN 26. She was admitted for anasarca, started on IV diuretic, and work up included RUQ U/S which demonstrated multiple masses in the liver. Further work up is underway.  Assessment & Plan: Principal Problem:   Anasarca Active Problems:   Hypertension   Elevated liver enzymes   Proximal limb muscle weakness   Abnormal LFTs   Weakness  Anasarca, acute on chronic HFpEF: Grossly fluid overloaded based on CXR pulm edema and exam with significant pitting edema. Reported dry weight ~220lbs per patient and currently 255lbs. Suspect hypoalbuminemia and diastolic dysfunction on echo are primary contributors. Would expect prolonged PT/INR if hepatic synthetic capacity was truly decreased. No proteinuria noted. TSH wnl. Suspect BNP of 299 is suppressed due to obesity. - UOP again not documented. Again asked nursing to document. The patient reports steady urine output. Down 3lbs total. Would have hoped for more diuresis.  - Continue lasix 40mg  IV BID, may need cardiology assistance in management if unable to diurese.  - Dietitian consulted, supplement protein with prostat  Metastatic cancer of unknown primary: Bulky metastatic disease in liver, essentially replaced right inferior liver on CT, extending to right kidney superior pole, also involving perigastric, portahepatis, and omentum.  - Follow up biopsy pathology results performed 2/21.  - Plan to involve oncology once tissue diagnosis is reached. This has been discussed with the patient.  - CEA  10.1, AFP normal at 2.3, CA 19-9 normal at 1. No adnexal or pancreatic mass. - Mildly elevated LFTs. Will trend.  Sigmoid colon mass: 4cm on CT, per radiology, not typical appearance of colorectal primary. Reports cologuard screening "clear" 1 year ago.  - Reached out through EMR to PCP for colon cancer screening records. - CEA nonspecifically elevated at 10.1.  - If biopsy not helpful, would consult GI for biopsy   AKI: Due to diuresis.  - Continue to monitor with diuresis.   Leukocytosis: Unclear etiology. Will continue monitoring and pan-culture if fever develops.   Abdominal pain: Multifactorial, likely related to degree of distention with bulky metastatic disease and anasarca. Also in consideration with loose stools, and leukocytosis, is colitis, though this did not appear on recent CT abd/pelvis. Improvement spontaneously is reassuring.  - Can continue oxyIR (tolerated and effective for prior pains), though hasn't needed in 24 hours. - Monitor stool output.    HTN:  - Hold norvasc for now. Could theoretically contribute to edema. Replaced with hydralazine with improvement, wide pulse pressures.   Hypokalemia: Improved on 11mEq total daily dose supplement. - Continue to supplement TID and recheck in AM with loop diuretic administration/augmentation.   Obesity: BMI 38, though significant water weight. Though still will qualify for obesity. - Weight loss is recommended.  Hyperlipidemia:  - Hold statin  Proximal muscle weakness: CPK nl, TSH nl.  - PT/OT to continue, suspect would need SNF  DVT prophylaxis: Lovenox Code Status: Full Family Communication: None at bedside Disposition Plan: SNF once closer to euvolemia.  Consultants:   IR   Procedures:   Echocardiogram 09/08/2018:  1. The left ventricle has hyperdynamic systolic function, with an  ejection fraction of >65%. The cavity size was normal. There is moderately increased left ventricular wall thickness. Left  ventricular diastolic Doppler parameters are consistent with  impaired relaxation.  2. The right ventricle has normal systolic function. The cavity was mildly enlarged. There is no increase in right ventricular wall thickness.  3. The mitral valve is normal in structure.  4. The tricuspid valve is normal in structure.  5. The aortic valve is normal in structure.  6. The pulmonic valve was normal in structure.  09/09/2018 Dr. Annamaria Boots: S/p [place] LEFT LIVER MASS CORE BX  Antimicrobials:  None   Subjective: Worked with PT, required 2+ assist, SNF recommended though she's not sure she would want to go. Will discuss with her sisters. No further mention of loose stools. Abdominal pain resolved spontaneously. No palpitations, chest pain, dyspnea. Feels leg swelling is about stable.   Objective: Vitals:   09/09/18 1952 09/10/18 0547 09/10/18 0921 09/10/18 1426  BP: (!) 144/50 (!) 152/48 (!) 138/49 (!) 158/59  Pulse: 71 65 70 77  Resp: 20 20 20 20   Temp: 98.6 F (37 C) 97.9 F (36.6 C) 98.2 F (36.8 C) 98.3 F (36.8 C)  TempSrc: Oral  Oral Oral  SpO2: 91% 95% 99% 97%  Weight:  114.7 kg    Height:        Intake/Output Summary (Last 24 hours) at 09/10/2018 1657 Last data filed at 09/10/2018 1222 Gross per 24 hour  Intake 0.24 ml  Output 925 ml  Net -924.76 ml   Filed Weights   09/08/18 0500 09/09/18 0518 09/10/18 0547  Weight: 116.1 kg 115.2 kg 114.7 kg   Gen: Pleasant obese 78 y.o. female in no distress Pulm: Nonlabored breathing room air. No crackles or wheezes. CV: Regular rate and rhythm. No murmur, rub, or gallop. No JVD, 3+ pitting dependent edema including abdominal wall.  GI: Abdomen soft, not tender, modestly distended, obese, with normoactive bowel sounds.  Ext: Warm, no deformities Skin: No rashes, lesions or ulcers on visualized skin. Neuro: Alert and oriented. No focal neurological deficits. Psych: Judgement and insight appear fair. Mood euthymic & affect congruent.  Behavior is appropriate.    Data Reviewed: I have personally reviewed following labs and imaging studies  CBC: Recent Labs  Lab 09/07/18 2203 09/09/18 0645 09/10/18 0543  WBC 14.0* 14.3* 16.7*  HGB 11.3* 10.9* 11.6*  HCT 37.2 36.0 37.8  MCV 93.5 94.2 93.1  PLT 317 293 704   Basic Metabolic Panel: Recent Labs  Lab 09/07/18 2203 09/08/18 0652 09/09/18 0645 09/10/18 0543  NA 143 145 144 145  K 4.8 3.0* 3.0* 3.9  CL 104 105 103 106  CO2 30 32 31 31  GLUCOSE 135* 131* 167* 152*  BUN 26* 23 22 29*  CREATININE 0.96 0.94 0.91 1.15*  CALCIUM 8.1* 8.1* 8.0* 8.1*  MG  --  2.0  --  2.1  PHOS  --  2.8  --   --    GFR: Estimated Creatinine Clearance: 53.6 mL/min (A) (by C-G formula based on SCr of 1.15 mg/dL (H)). Liver Function Tests: Recent Labs  Lab 09/07/18 2203 09/08/18 0652  AST 59* 32  ALT 45* 47*  ALKPHOS 254* 239*  BILITOT 2.2* 1.2  PROT 5.6* 5.5*  ALBUMIN 2.3* 2.3*   No results for input(s): LIPASE, AMYLASE in the last 168 hours. No results for input(s): AMMONIA in the last 168 hours. Coagulation Profile: Recent Labs  Lab 09/08/18 0652  INR 1.04   Cardiac Enzymes:  Recent Labs  Lab 09/07/18 2308  CKTOTAL 86   BNP (last 3 results) No results for input(s): PROBNP in the last 8760 hours. HbA1C: No results for input(s): HGBA1C in the last 72 hours. CBG: No results for input(s): GLUCAP in the last 168 hours. Lipid Profile: No results for input(s): CHOL, HDL, LDLCALC, TRIG, CHOLHDL, LDLDIRECT in the last 72 hours. Thyroid Function Tests: Recent Labs    09/08/18 0652  TSH 2.173   Anemia Panel: No results for input(s): VITAMINB12, FOLATE, FERRITIN, TIBC, IRON, RETICCTPCT in the last 72 hours. Urine analysis:    Component Value Date/Time   COLORURINE YELLOW 09/08/2018 [place] 09/08/2018 0447   LABSPEC 1.008 09/08/2018 0447   PHURINE 7.0 09/08/2018 [place] 09/08/2018 0447   HGBUR NEGATIVE 09/08/2018 [place] 09/08/2018 [place] 09/08/2018 0447   PROTEINUR NEGATIVE 09/08/2018 0447   NITRITE NEGATIVE 09/08/2018 0447   LEUKOCYTESUR NEGATIVE 09/08/2018 0447   No results found for this or any previous visit (from the past 240 hour(s)).    Radiology Studies: Ct Chest W Contrast  Result Date: 09/08/2018 CLINICAL DATA:  Liver masses on ultrasound earlier today.  Anasarca. EXAM: CT CHEST, ABDOMEN, AND PELVIS WITH CONTRAST TECHNIQUE: Multidetector CT imaging of the chest, abdomen and pelvis was performed following the standard protocol during bolus administration of intravenous contrast. CONTRAST:  119mL OMNIPAQUE IOHEXOL 300 MG/ML SOLN, 105mL OMNIPAQUE IOHEXOL 300 MG/ML SOLN COMPARISON:  Ultrasound exam 09/08/2018 FINDINGS: CT CHEST FINDINGS Cardiovascular: The heart is enlarged. Coronary artery calcification is evident. Atherosclerotic calcification is noted in the wall of the thoracic aorta. Mediastinum/Nodes: No mediastinal lymphadenopathy. There is no hilar lymphadenopathy. There is no axillary lymphadenopathy. The esophagus has normal imaging features. Lungs/Pleura: The central tracheobronchial airways are patent. Subsegmental atelectasis noted in the dependent lower lungs bilaterally. No suspicious pulmonary nodule or mass. No substantial pleural effusion. Musculoskeletal: No worrisome lytic or sclerotic osseous abnormality. CT ABDOMEN PELVIS FINDINGS Hepatobiliary: As seen on the recent ultrasound exam, there are multiple liver masses, bulky year in the right lobe and in the left. Index lesion in the medial right liver measures 6.9 x 5.0 cm on 54/2. Index lesion in the medial segment left liver measures 2.5 cm. A large exophytic lesion seen from the inferior right liver extends into Morison's pouch and generates substantial mass-effect on the right kidney. There is no evidence for gallstones, gallbladder wall thickening, or pericholecystic fluid. No intrahepatic or  extrahepatic biliary dilation. Pancreas: No focal mass lesion. No dilatation of the main duct. No intraparenchymal cyst. No peripancreatic edema. Spleen: No splenomegaly. No focal mass lesion. Adrenals/Urinary Tract: Right adrenal gland not visualized. Left adrenal gland unremarkable. 4.1 cm cyst noted in the upper pole left kidney. The mass in the inferior right liver extending down to the upper pole the right kidney does generate mass-effect on the right kidney but in some regions, appears to extend into the upper pole parenchyma of the right kidney (see coronal image 96 of series 5). 7 clear whether this is an exophytic mass arising from the upper right kidney or potentially hepatic origin invading the upper pole the right kidney. No evidence for hydroureter. The urinary bladder appears normal for the degree of distention. Stomach/Bowel: Stomach is unremarkable. No gastric wall thickening. No evidence of outlet obstruction. Duodenum is normally positioned as is the ligament of Treitz. No small bowel wall thickening. No small bowel dilatation. The terminal ileum is normal. The  appendix is not visualized, but there is no edema or inflammation in the region of the cecum. Right colon and transverse colon are unremarkable. Diverticular changes are noted in the left colon. 3.4 x 3.8 cm soft tissue mass is identified along the sigmoid colon (99/2). Vascular/Lymphatic: There is abdominal aortic atherosclerosis without aneurysm. IVC is displaced to the left by the inferior right hepatic disease. 1.6 cm short axis hepato duodenal ligament lymph node evident on 58/2. 1.9 cm lymph node in the porta hepatis visible on 62/2. 10 mm short axis aortocaval lymph node visible on 72/2. No pelvic sidewall lymphadenopathy. Reproductive: Uterus surgically absent.  There is no adnexal mass. Other: Small volume free fluid seen adjacent to the liver and spleen as well as in the cul-de-sac. 16 mm perigastric nodule noted on 67/2. Similar  adjacent nodule measures up to 2.0 cm. Multiple omental nodules are evident including 2.0 x 1.4 cm left omental nodule on 88/2. Musculoskeletal: No worrisome lytic or sclerotic osseous abnormality. IMPRESSION: 1. 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 2. Bulky metastatic disease is identified between the right liver in the upper pole the right kidney. This appears to involve both the liver and the kidney and is unclear whether this is primarily from the liver invading the upper pole the right kidney or potentially a right upper pole renal neoplasm invading the liver. 3. Perigastric, porta hepatis, and omental metastatic disease. Borderline enlarged retroperitoneal lymph nodes also suggest metastatic involvement. 4. 4 cm soft tissue mass associated with the sigmoid colon. While this could certainly be a primary colonic neoplasm, it has a severe coal configuration and does not have typical colorectal cancer CT imaging appearance. Given the metastatic disease elsewhere in the abdomen/pelvis, this could also represent metastatic disease in the pelvis. 5. Given the findings immediately above, no obvious primary source for the diffuse metastatic disease is evident. If the lesion along the sigmoid colon ends up being colorectal cancer, than the other findings in the abdomen and pelvis are certainly consistent with metastatic disease. Right adrenal or right renal cancer with direct liver invasion along with liver metastases, peritoneal/omental disease and metastatic lymphadenopathy would also be a consideration. 6. No evidence for metastatic disease in the thorax. 7. Aortic Atherosclerois (ICD10-170.0) Electronically Signed   By: Misty Stanley M.D.   On: 09/08/2018 18:30   Ct Abdomen Pelvis W Contrast  Result Date: 09/08/2018 CLINICAL DATA:  Liver masses on ultrasound earlier today.  Anasarca. EXAM: CT CHEST, ABDOMEN, AND PELVIS WITH CONTRAST TECHNIQUE:  Multidetector CT imaging of the chest, abdomen and pelvis was performed following the standard protocol during bolus administration of intravenous contrast. CONTRAST:  127mL OMNIPAQUE IOHEXOL 300 MG/ML SOLN, 84mL OMNIPAQUE IOHEXOL 300 MG/ML SOLN COMPARISON:  Ultrasound exam 09/08/2018 FINDINGS: CT CHEST FINDINGS Cardiovascular: The heart is enlarged. Coronary artery calcification is evident. Atherosclerotic calcification is noted in the wall of the thoracic aorta. Mediastinum/Nodes: No mediastinal lymphadenopathy. There is no hilar lymphadenopathy. There is no axillary lymphadenopathy. The esophagus has normal imaging features. Lungs/Pleura: The central tracheobronchial airways are patent. Subsegmental atelectasis noted in the dependent lower lungs bilaterally. No suspicious pulmonary nodule or mass. No substantial pleural effusion. Musculoskeletal: No worrisome lytic or sclerotic osseous abnormality. CT ABDOMEN PELVIS FINDINGS Hepatobiliary: As seen on the recent ultrasound exam, there are multiple liver masses, bulky year in the right lobe and in the left. Index lesion in the medial right liver measures 6.9 x 5.0 cm on 54/2. Index  lesion in the medial segment left liver measures 2.5 cm. A large exophytic lesion seen from the inferior right liver extends into Morison's pouch and generates substantial mass-effect on the right kidney. There is no evidence for gallstones, gallbladder wall thickening, or pericholecystic fluid. No intrahepatic or extrahepatic biliary dilation. Pancreas: No focal mass lesion. No dilatation of the main duct. No intraparenchymal cyst. No peripancreatic edema. Spleen: No splenomegaly. No focal mass lesion. Adrenals/Urinary Tract: Right adrenal gland not visualized. Left adrenal gland unremarkable. 4.1 cm cyst noted in the upper pole left kidney. The mass in the inferior right liver extending down to the upper pole the right kidney does generate mass-effect on the right kidney but in some  regions, appears to extend into the upper pole parenchyma of the right kidney (see coronal image 96 of series 5). 7 clear whether this is an exophytic mass arising from the upper right kidney or potentially hepatic origin invading the upper pole the right kidney. No evidence for hydroureter. The urinary bladder appears normal for the degree of distention. Stomach/Bowel: Stomach is unremarkable. No gastric wall thickening. No evidence of outlet obstruction. Duodenum is normally positioned as is the ligament of Treitz. No small bowel wall thickening. No small bowel dilatation. The terminal ileum is normal. The appendix is not visualized, but there is no edema or inflammation in the region of the cecum. Right colon and transverse colon are unremarkable. Diverticular changes are noted in the left colon. 3.4 x 3.8 cm soft tissue mass is identified along the sigmoid colon (99/2). Vascular/Lymphatic: There is abdominal aortic atherosclerosis without aneurysm. IVC is displaced to the left by the inferior right hepatic disease. 1.6 cm short axis hepato duodenal ligament lymph node evident on 58/2. 1.9 cm lymph node in the porta hepatis visible on 62/2. 10 mm short axis aortocaval lymph node visible on 72/2. No pelvic sidewall lymphadenopathy. Reproductive: Uterus surgically absent.  There is no adnexal mass. Other: Small volume free fluid seen adjacent to the liver and spleen as well as in the cul-de-sac. 16 mm perigastric nodule noted on 67/2. Similar adjacent nodule measures up to 2.0 cm. Multiple omental nodules are evident including 2.0 x 1.4 cm left omental nodule on 88/2. Musculoskeletal: No worrisome lytic or sclerotic osseous abnormality. IMPRESSION: 1. 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 2. Bulky metastatic disease is identified between the right liver in the upper pole the right kidney. This appears to involve both the liver and the kidney and  is unclear whether this is primarily from the liver invading the upper pole the right kidney or potentially a right upper pole renal neoplasm invading the liver. 3. Perigastric, porta hepatis, and omental metastatic disease. Borderline enlarged retroperitoneal lymph nodes also suggest metastatic involvement. 4. 4 cm soft tissue mass associated with the sigmoid colon. While this could certainly be a primary colonic neoplasm, it has a severe coal configuration and does not have typical colorectal cancer CT imaging appearance. Given the metastatic disease elsewhere in the abdomen/pelvis, this could also represent metastatic disease in the pelvis. 5. Given the findings immediately above, no obvious primary source for the diffuse metastatic disease is evident. If the lesion along the sigmoid colon ends up being colorectal cancer, than the other findings in the abdomen and pelvis are certainly consistent with metastatic disease. Right adrenal or right renal cancer with direct liver invasion along with liver metastases, peritoneal/omental disease and metastatic lymphadenopathy would also be a consideration. 6.  No evidence for metastatic disease in the thorax. 7. Aortic Atherosclerois (ICD10-170.0) Electronically Signed   By: Misty Stanley M.D.   On: 09/08/2018 18:30   US Biopsy (liver)  Result Date: 09/09/2018 INDICATION: LIVER MASS, SUSPECT COLON PRIMARY EXAM: ULTRASOUND LEFT LIVER MASS BIOPSY MEDICATIONS: 1% lidocaine local ANESTHESIA/SEDATION: Moderate (conscious) sedation was employed during this procedure. A total of Versed 1.0 mg and Fentanyl 50 mcg was administered intravenously. Moderate Sedation Time: 10 minutes. The patient's level of consciousness and vital signs were monitored continuously by radiology nursing throughout the procedure under my direct supervision. FLUOROSCOPY TIME:  Fluoroscopy Time: None. COMPLICATIONS: None immediate. PROCEDURE: Informed written consent was obtained from the patient after  a thorough discussion of the procedural risks, benefits and alternatives. All questions were addressed. Maximal Sterile Barrier Technique was utilized including caps, mask, sterile gowns, sterile gloves, sterile drape, hand hygiene and skin antiseptic. A timeout was performed prior to the initiation of the procedure. Previous imaging reviewed. Patient positioned supine. Preliminary ultrasound performed. A left lobe lesion is demonstrated in the epigastric region. Overlying skin marked. Under sterile conditions and local anesthesia, a 17 gauge 6.8 cm access needle was advanced from an anterior epigastric approach to the lesion. Needle position confirmed with ultrasound. 18 gauge core biopsies obtained under direct ultrasound. Images obtained for documentation. Samples were placed in formalin. Needle removed. Patient tolerated the biopsy well. No immediate complication IMPRESSION: Successful ultrasound left liver mass 18 gauge core biopsy Electronically Signed   By: Jerilynn Mages.  Shick M.D.   On: 09/09/2018 14:40    Scheduled Meds: . enoxaparin (LOVENOX) injection  40 mg Subcutaneous Daily  . feeding supplement (PRO-STAT SUGAR FREE 64)  30 mL Oral Daily  . furosemide  40 mg Intravenous BID  . hydrALAZINE  25 mg Oral Q8H  . multivitamin with minerals  1 tablet Oral Daily  . potassium chloride  40 mEq Oral TID  . sodium chloride flush  3 mL Intravenous Q12H   Continuous Infusions: . sodium chloride Stopped (09/09/18 1241)     LOS: 2 days   Time spent: 35 minutes.  Patrecia Pour, MD [facility] [email or web address] Password [facility] 09/10/2018, 4:57 PM

## 2018-09-11 ENCOUNTER — Encounter (HOSPITAL_COMMUNITY): Payer: Self-pay

## 2018-09-11 DIAGNOSIS — I10 Essential (primary) hypertension: Secondary | ICD-10-CM

## 2018-09-11 DIAGNOSIS — R609 Edema, unspecified: Secondary | ICD-10-CM

## 2018-09-11 LAB — BASIC METABOLIC PANEL
Anion gap: 8 (ref 5–15)
BUN: 33 mg/dL — ABNORMAL HIGH (ref 8–23)
CO2: 30 mmol/L (ref 22–32)
Calcium: 8.2 mg/dL — ABNORMAL LOW (ref 8.9–10.3)
Chloride: 105 mmol/L (ref 98–111)
Creatinine, Ser: 1.01 mg/dL — ABNORMAL HIGH (ref 0.44–1.00)
GFR calc non Af Amer: 53 mL/min — ABNORMAL LOW (ref >60)
Glucose, Bld: 161 mg/dL — ABNORMAL HIGH (ref 70–99)
Potassium: 4.6 mmol/L (ref 3.5–5.1)
Sodium: 143 mmol/L (ref 135–145)

## 2018-09-11 LAB — CBC
HCT: 37.6 % (ref 36.0–46.0)
Hemoglobin: 11.4 g/dL — ABNORMAL LOW (ref 12.0–15.0)
MCH: 28.4 pg (ref 26.0–34.0)
MCHC: 30.3 g/dL (ref 30.0–36.0)
MCV: 93.5 fL (ref 80.0–100.0)
Platelets: 329 10*3/uL (ref 150–400)
RBC: 4.02 MIL/uL (ref 3.87–5.11)
RDW: 17.3 % — ABNORMAL HIGH (ref 11.5–15.5)
WBC: 17.4 10*3/uL — ABNORMAL HIGH (ref 4.0–10.5)
nRBC: 0.9 % — ABNORMAL HIGH (ref 0.0–0.2)

## 2018-09-11 MED ORDER — HYDRALAZINE HCL 50 MG PO TABS
50.0000 mg | ORAL_TABLET | Freq: Three times a day (TID) | ORAL | Status: DC
  Administered 2018-09-11 – 2018-09-20 (×25): 50 mg via ORAL
  Filled 2018-09-11 (×26): qty 1

## 2018-09-11 MED ORDER — FUROSEMIDE 10 MG/ML IJ SOLN
80.0000 mg | Freq: Two times a day (BID) | INTRAMUSCULAR | Status: DC
  Administered 2018-09-11 – 2018-09-15 (×8): 80 mg via INTRAVENOUS
  Filled 2018-09-11 (×8): qty 8

## 2018-09-11 NOTE — Plan of Care (Signed)

## 2018-09-11 NOTE — Consult Note (Signed)
Cardiology Consultation:   Patient ID: Carrie Chaney MRN: [id number]; DOB: 09-28-1940  Admit date: 09/07/2018 Date of Consult: 09/11/2018  Primary Care Provider: Lucianne Lei, MD Primary Cardiologist: New   Patient Profile:   Carrie Chaney is a 78 y.o. female with a hx of HTN who is being seen today for the evaluation of edema at the request of Dr Bonner Puna.  History of Present Illness:   Carrie Chaney is a 78 yo with HTN, HL  Over hte past couple months the pt has noticed increased swelling in legs and over the past few days hands   No CP   Breathing is fair   No change in diet     Seen by Clayburn Pert as outpt  Started on Bumex without much response   Admitted for treatment of generalized edema   RUQ [place] and CT showed multiple liver masses, renal mass, omental studding  Bx done   Pathology pending   Dry wt 220 lb   Was 255 on admit Labs:  Albumen 2.4  Cr 0.94  Hgb 11  WBC 14  CEA 10 Echo on 09/08/18 LVEF >65%  Mod LVH   Impared relaxation  Since admit he is net negative 2.5 L Breathing is OK  No CP     Past Medical History:  Diagnosis Date  . Diabetes mellitus without complication ([place])   . Hyperlipemia     Past Surgical History:  Procedure Laterality Date  . ABDOMINAL HYSTERECTOMY         Inpatient Medications: Scheduled Meds: . enoxaparin (LOVENOX) injection  40 mg Subcutaneous Daily  . feeding supplement (PRO-STAT SUGAR FREE 64)  30 mL Oral Daily  . furosemide  40 mg Intravenous BID  . hydrALAZINE  25 mg Oral Q8H  . multivitamin with minerals  1 tablet Oral Daily  . sodium chloride flush  3 mL Intravenous Q12H   Continuous Infusions: . sodium chloride Stopped (09/09/18 1241)   PRN Meds: sodium chloride, acetaminophen, ondansetron (ZOFRAN) IV, oxyCODONE, sodium chloride flush  Allergies:    Allergies  Allergen Reactions  . Tomato Other (See Comments)    Breaks out gums    Social History:   Social History   Socioeconomic History  . Marital status: Married   Spouse name: Not on file  . Number of children: Not on file  . Years of education: Not on file  . Highest education level: Not on file  Occupational History  . Not on file  Social Needs  . Financial resource strain: Not on file  . Food insecurity:    Worry: Not on file    Inability: Not on file  . Transportation needs:    Medical: Not on file    Non-medical: Not on file  Tobacco Use  . Smoking status: Current Every Day Smoker  . Smokeless tobacco: Never Used  . Tobacco comment: one a day  Substance and Sexual Activity  . Alcohol use: No  . Drug use: No  . Sexual activity: Not on file  Lifestyle  . Physical activity:    Days per week: Not on file    Minutes per session: Not on file  . Stress: Not on file  Relationships  . Social connections:    Talks on phone: Not on file    Gets together: Not on file    Attends religious service: Not on file    Active member of club or organization: Not on file    Attends meetings of clubs or  organizations: Not on file    Relationship status: Not on file  . Intimate partner violence:    Fear of current or ex partner: Not on file    Emotionally abused: Not on file    Physically abused: Not on file    Forced sexual activity: Not on file  Other Topics Concern  . Not on file  Social History Narrative  . Not on file    Family History:    Family History  Problem Relation Age of Onset  . Hypertension Mother   . Diabetes Other   . Cancer Neg Hx      ROS:  Please see the history of present illness.   All other ROS reviewed and negative.     Physical Exam/Data:   Vitals:   09/10/18 1426 09/10/18 2028 09/11/18 0500 09/11/18 0518  BP: (!) 158/59 (!) 149/54  (!) 182/57  Pulse: 77 79  70  Resp: 20 (!) 22  16  Temp: 98.3 F (36.8 C)   98.3 F (36.8 C)  TempSrc: Oral     SpO2: 97% 90%  91%  Weight:   116.2 kg   Height:        Intake/Output Summary (Last 24 hours) at 09/11/2018 1000 Last data filed at 09/11/2018 0827 Gross  per 24 hour  Intake 0 ml  Output 950 ml  Net -950 ml   Last 3 Weights 09/11/2018 09/10/2018 09/09/2018  Weight (lbs) 256 lb 2.8 oz 252 lb 13.9 oz 253 lb 15.5 oz  Weight (kg) 116.2 kg 114.7 kg 115.2 kg     Body mass index is 38.95 kg/m.  General: Morbidly obese 78 yo , in no acute distress HEENT: normal Lymph: no adenopathy Neck: Neck is full  JVP is increased   Endocrine:  No thryomegaly Vascular: No carotid bruits; FA pulses 2+ bilaterally without bruits  Cardiac:  normal S1, S2; RRR; no murmur  Lungs:  clear to auscultation bilaterally, no wheezing, rhonchi or rales  Abd: OBese  soft, nontender, no hepatomegaly  Ext: 2+ LE edema   1+ UE edema Musculoskeletal:  No deformities, BUE and BLE strength normal and equal Skin: warm and dry  Neuro:  CNs 2-12 intact, no focal abnormalities noted Psych:  Normal affect   EKG:  The EKG was personally reviewed and demonstrates:  SR 61 bpm   T wave inversion III, AVF (new from previous) Telemetry:  Telemetry was personally reviewed and demonstrates:  SR  Relevant CV Studies:  Echo:   2/ 1. The left ventricle has hyperdynamic systolic function, with an ejection fraction of >65%. The cavity size was normal. There is moderately increased left ventricular wall thickness. Left ventricular diastolic Doppler parameters are consistent with  impaired relaxation.  2. The right ventricle has normal systolic function. The cavity was mildly enlarged. There is no increase in right ventricular wall thickness.  3. The mitral valve is normal in structure.  4. The tricuspid valve is normal in structure.  5. The aortic valve is normal in structure.  6. The pulmonic valve was normal in structure. 20/20  Laboratory Data:  Chemistry Recent Labs  Lab 09/09/18 0645 09/10/18 0543 09/11/18 0541  NA 144 145 143  K 3.0* 3.9 4.6  CL 103 106 105  CO2 31 31 30   GLUCOSE 167* 152* 161*  BUN 22 29* 33*  CREATININE 0.91 1.15* 1.01*  CALCIUM 8.0* 8.1* 8.2*    GFRNONAA >60 46* 53*  GFRAA >60 53* >60  ANIONGAP 10 8  8    Recent Labs  Lab 09/07/18 2203 09/08/18 0652  PROT 5.6* 5.5*  ALBUMIN 2.3* 2.3*  AST 59* 32  ALT 45* 47*  ALKPHOS 254* 239*  BILITOT 2.2* 1.2   Hematology Recent Labs  Lab 09/09/18 0645 09/10/18 0543 09/11/18 0541  WBC 14.3* 16.7* 17.4*  RBC 3.82* 4.06 4.02  HGB 10.9* 11.6* 11.4*  HCT 36.0 37.8 37.6  MCV 94.2 93.1 93.5  MCH 28.5 28.6 28.4  MCHC 30.3 30.7 30.3  RDW 17.1* 17.7* 17.3*  PLT 293 315 329   Cardiac EnzymesNo results for input(s): TROPONINI in the last 168 hours. No results for input(s): TROPIPOC in the last 168 hours.  BNP Recent Labs  Lab 09/07/18 2203  BNP 299.9*    DDimer No results for input(s): DDIMER in the last 168 hours.  Radiology/Studies:  Dg Chest 2 View  Result Date: 09/07/2018 CLINICAL DATA:  Swelling of the left lower extremity for a month. Dyspnea. EXAM: CHEST - 2 VIEW COMPARISON:  None. FINDINGS: Mild cardiomegaly with aortic atherosclerosis. Central vascular congestion consistent with mild CHF is noted. Atelectasis is seen at the right lung base. IMPRESSION: Cardiomegaly with aortic atherosclerosis. Central vascular congestion consistent with mild CHF. Electronically Signed   By: [name] M.D.   On: 09/07/2018 20:01   Ct Chest W Contrast  Result Date: 09/08/2018 CLINICAL DATA:  Liver masses on ultrasound earlier today.  Anasarca. EXAM: CT CHEST, ABDOMEN, AND PELVIS WITH CONTRAST TECHNIQUE: Multidetector CT imaging of the chest, abdomen and pelvis was performed following the standard protocol during bolus administration of intravenous contrast. CONTRAST:  172mL OMNIPAQUE IOHEXOL 300 MG/ML SOLN, 48mL OMNIPAQUE IOHEXOL 300 MG/ML SOLN COMPARISON:  Ultrasound exam 09/08/2018 FINDINGS: CT CHEST FINDINGS Cardiovascular: The heart is enlarged. Coronary artery calcification is evident. Atherosclerotic calcification is noted in the wall of the thoracic aorta. Mediastinum/Nodes: No  mediastinal lymphadenopathy. There is no hilar lymphadenopathy. There is no axillary lymphadenopathy. The esophagus has normal imaging features. Lungs/Pleura: The central tracheobronchial airways are patent. Subsegmental atelectasis noted in the dependent lower lungs bilaterally. No suspicious pulmonary nodule or mass. No substantial pleural effusion. Musculoskeletal: No worrisome lytic or sclerotic osseous abnormality. CT ABDOMEN PELVIS FINDINGS Hepatobiliary: As seen on the recent ultrasound exam, there are multiple liver masses, bulky year in the right lobe and in the left. Index lesion in the medial right liver measures 6.9 x 5.0 cm on 54/2. Index lesion in the medial segment left liver measures 2.5 cm. A large exophytic lesion seen from the inferior right liver extends into Morison's pouch and generates substantial mass-effect on the right kidney. There is no evidence for gallstones, gallbladder wall thickening, or pericholecystic fluid. No intrahepatic or extrahepatic biliary dilation. Pancreas: No focal mass lesion. No dilatation of the main duct. No intraparenchymal cyst. No peripancreatic edema. Spleen: No splenomegaly. No focal mass lesion. Adrenals/Urinary Tract: Right adrenal gland not visualized. Left adrenal gland unremarkable. 4.1 cm cyst noted in the upper pole left kidney. The mass in the inferior right liver extending down to the upper pole the right kidney does generate mass-effect on the right kidney but in some regions, appears to extend into the upper pole parenchyma of the right kidney (see coronal image 96 of series 5). 7 clear whether this is an exophytic mass arising from the upper right kidney or potentially hepatic origin invading the upper pole the right kidney. No evidence for hydroureter. The urinary bladder appears normal for the degree of distention. Stomach/Bowel: Stomach is unremarkable. No  gastric wall thickening. No evidence of outlet obstruction. Duodenum is normally positioned  as is the ligament of Treitz. No small bowel wall thickening. No small bowel dilatation. The terminal ileum is normal. The appendix is not visualized, but there is no edema or inflammation in the region of the cecum. Right colon and transverse colon are unremarkable. Diverticular changes are noted in the left colon. 3.4 x 3.8 cm soft tissue mass is identified along the sigmoid colon (99/2). Vascular/Lymphatic: There is abdominal aortic atherosclerosis without aneurysm. IVC is displaced to the left by the inferior right hepatic disease. 1.6 cm short axis hepato duodenal ligament lymph node evident on 58/2. 1.9 cm lymph node in the porta hepatis visible on 62/2. 10 mm short axis aortocaval lymph node visible on 72/2. No pelvic sidewall lymphadenopathy. Reproductive: Uterus surgically absent.  There is no adnexal mass. Other: Small volume free fluid seen adjacent to the liver and spleen as well as in the cul-de-sac. 16 mm perigastric nodule noted on 67/2. Similar adjacent nodule measures up to 2.0 cm. Multiple omental nodules are evident including 2.0 x 1.4 cm left omental nodule on 88/2. Musculoskeletal: No worrisome lytic or sclerotic osseous abnormality. IMPRESSION: 1. 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 2. Bulky metastatic disease is identified between the right liver in the upper pole the right kidney. This appears to involve both the liver and the kidney and is unclear whether this is primarily from the liver invading the upper pole the right kidney or potentially a right upper pole renal neoplasm invading the liver. 3. Perigastric, porta hepatis, and omental metastatic disease. Borderline enlarged retroperitoneal lymph nodes also suggest metastatic involvement. 4. 4 cm soft tissue mass associated with the sigmoid colon. While this could certainly be a primary colonic neoplasm, it has a severe coal configuration and does not have typical  colorectal cancer CT imaging appearance. Given the metastatic disease elsewhere in the abdomen/pelvis, this could also represent metastatic disease in the pelvis. 5. Given the findings immediately above, no obvious primary source for the diffuse metastatic disease is evident. If the lesion along the sigmoid colon ends up being colorectal cancer, than the other findings in the abdomen and pelvis are certainly consistent with metastatic disease. Right adrenal or right renal cancer with direct liver invasion along with liver metastases, peritoneal/omental disease and metastatic lymphadenopathy would also be a consideration. 6. No evidence for metastatic disease in the thorax. 7. Aortic Atherosclerois (ICD10-170.0) Electronically Signed   By: Misty Stanley M.D.   On: 09/08/2018 18:30   Ct Abdomen Pelvis W Contrast  Result Date: 09/08/2018 CLINICAL DATA:  Liver masses on ultrasound earlier today.  Anasarca. EXAM: CT CHEST, ABDOMEN, AND PELVIS WITH CONTRAST TECHNIQUE: Multidetector CT imaging of the chest, abdomen and pelvis was performed following the standard protocol during bolus administration of intravenous contrast. CONTRAST:  166mL OMNIPAQUE IOHEXOL 300 MG/ML SOLN, 17mL OMNIPAQUE IOHEXOL 300 MG/ML SOLN COMPARISON:  Ultrasound exam 09/08/2018 FINDINGS: CT CHEST FINDINGS Cardiovascular: The heart is enlarged. Coronary artery calcification is evident. Atherosclerotic calcification is noted in the wall of the thoracic aorta. Mediastinum/Nodes: No mediastinal lymphadenopathy. There is no hilar lymphadenopathy. There is no axillary lymphadenopathy. The esophagus has normal imaging features. Lungs/Pleura: The central tracheobronchial airways are patent. Subsegmental atelectasis noted in the dependent lower lungs bilaterally. No suspicious pulmonary nodule or mass. No substantial pleural effusion. Musculoskeletal: No worrisome lytic or sclerotic osseous abnormality. CT ABDOMEN PELVIS FINDINGS Hepatobiliary: As seen on  the recent ultrasound exam, there are multiple liver masses, bulky year in the right lobe and in the left. Index lesion in the medial right liver measures 6.9 x 5.0 cm on 54/2. Index lesion in the medial segment left liver measures 2.5 cm. A large exophytic lesion seen from the inferior right liver extends into Morison's pouch and generates substantial mass-effect on the right kidney. There is no evidence for gallstones, gallbladder wall thickening, or pericholecystic fluid. No intrahepatic or extrahepatic biliary dilation. Pancreas: No focal mass lesion. No dilatation of the main duct. No intraparenchymal cyst. No peripancreatic edema. Spleen: No splenomegaly. No focal mass lesion. Adrenals/Urinary Tract: Right adrenal gland not visualized. Left adrenal gland unremarkable. 4.1 cm cyst noted in the upper pole left kidney. The mass in the inferior right liver extending down to the upper pole the right kidney does generate mass-effect on the right kidney but in some regions, appears to extend into the upper pole parenchyma of the right kidney (see coronal image 96 of series 5). 7 clear whether this is an exophytic mass arising from the upper right kidney or potentially hepatic origin invading the upper pole the right kidney. No evidence for hydroureter. The urinary bladder appears normal for the degree of distention. Stomach/Bowel: Stomach is unremarkable. No gastric wall thickening. No evidence of outlet obstruction. Duodenum is normally positioned as is the ligament of Treitz. No small bowel wall thickening. No small bowel dilatation. The terminal ileum is normal. The appendix is not visualized, but there is no edema or inflammation in the region of the cecum. Right colon and transverse colon are unremarkable. Diverticular changes are noted in the left colon. 3.4 x 3.8 cm soft tissue mass is identified along the sigmoid colon (99/2). Vascular/Lymphatic: There is abdominal aortic atherosclerosis without aneurysm. IVC  is displaced to the left by the inferior right hepatic disease. 1.6 cm short axis hepato duodenal ligament lymph node evident on 58/2. 1.9 cm lymph node in the porta hepatis visible on 62/2. 10 mm short axis aortocaval lymph node visible on 72/2. No pelvic sidewall lymphadenopathy. Reproductive: Uterus surgically absent.  There is no adnexal mass. Other: Small volume free fluid seen adjacent to the liver and spleen as well as in the cul-de-sac. 16 mm perigastric nodule noted on 67/2. Similar adjacent nodule measures up to 2.0 cm. Multiple omental nodules are evident including 2.0 x 1.4 cm left omental nodule on 88/2. Musculoskeletal: No worrisome lytic or sclerotic osseous abnormality. IMPRESSION: 1. 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 2. Bulky metastatic disease is identified between the right liver in the upper pole the right kidney. This appears to involve both the liver and the kidney and is unclear whether this is primarily from the liver invading the upper pole the right kidney or potentially a right upper pole renal neoplasm invading the liver. 3. Perigastric, porta hepatis, and omental metastatic disease. Borderline enlarged retroperitoneal lymph nodes also suggest metastatic involvement. 4. 4 cm soft tissue mass associated with the sigmoid colon. While this could certainly be a primary colonic neoplasm, it has a severe coal configuration and does not have typical colorectal cancer CT imaging appearance. Given the metastatic disease elsewhere in the abdomen/pelvis, this could also represent metastatic disease in the pelvis. 5. Given the findings immediately above, no obvious primary source for the diffuse metastatic disease is evident. If the lesion along the sigmoid colon ends up being colorectal cancer, than the other findings in the abdomen  and pelvis are certainly consistent with metastatic disease. Right adrenal or right renal cancer  with direct liver invasion along with liver metastases, peritoneal/omental disease and metastatic lymphadenopathy would also be a consideration. 6. No evidence for metastatic disease in the thorax. 7. Aortic Atherosclerois (ICD10-170.0) Electronically Signed   By: Misty Stanley M.D.   On: 09/08/2018 18:30   US Biopsy (liver)  Result Date: 09/09/2018 INDICATION: LIVER MASS, SUSPECT COLON PRIMARY EXAM: ULTRASOUND LEFT LIVER MASS BIOPSY MEDICATIONS: 1% lidocaine local ANESTHESIA/SEDATION: Moderate (conscious) sedation was employed during this procedure. A total of Versed 1.0 mg and Fentanyl 50 mcg was administered intravenously. Moderate Sedation Time: 10 minutes. The patient's level of consciousness and vital signs were monitored continuously by radiology nursing throughout the procedure under my direct supervision. FLUOROSCOPY TIME:  Fluoroscopy Time: None. COMPLICATIONS: None immediate. PROCEDURE: Informed written consent was obtained from the patient after a thorough discussion of the procedural risks, benefits and alternatives. All questions were addressed. Maximal Sterile Barrier Technique was utilized including caps, mask, sterile gowns, sterile gloves, sterile drape, hand hygiene and skin antiseptic. A timeout was performed prior to the initiation of the procedure. Previous imaging reviewed. Patient positioned supine. Preliminary ultrasound performed. A left lobe lesion is demonstrated in the epigastric region. Overlying skin marked. Under sterile conditions and local anesthesia, a 17 gauge 6.8 cm access needle was advanced from an anterior epigastric approach to the lesion. Needle position confirmed with ultrasound. 18 gauge core biopsies obtained under direct ultrasound. Images obtained for documentation. Samples were placed in formalin. Needle removed. Patient tolerated the biopsy well. No immediate complication IMPRESSION: Successful ultrasound left liver mass 18 gauge core biopsy Electronically Signed    By: Jerilynn Mages.  Shick M.D.   On: 09/09/2018 14:40   US Abdomen Limited Ruq  Result Date: 09/08/2018 CLINICAL DATA:  78 year old female with abnormal LFTs.  Anasarca. EXAM: ULTRASOUND ABDOMEN LIMITED RIGHT UPPER QUADRANT COMPARISON:  None. FINDINGS: Gallbladder: No gallstones or wall thickening visualized. No sonographic Murphy sign noted by sonographer. Common bile duct: Diameter: 4 millimeters, normal. Liver: Abnormal, highly irregular and heterogeneous liver echogenicity suggesting multiple liver masses ranging from 2.5 centimeters to 10.5 centimeters diameter (images 18, 35, 43). The liver contour seems to remain relatively smooth (image 31). Portal vein is patent on color Doppler imaging with normal direction of blood flow towards the liver. Other findings: No ascites. IMPRESSION: Positive for multiple liver masses without obvious cirrhosis. Favor metastatic disease over primary liver tumor. CT chest abdomen and pelvis (oral and IV contrast preferred) recommended. Electronically Signed   By: Genevie Ann M.D.   On: 09/08/2018 02:20    Assessment and Plan:   Pt is a 78 yo admitted for eval/Rx of diffuse edema No prior cardiac history    1  Edema multifactoral  Diastolic dysfunction  in setting of hypoalbumenemia and diffuse metastatic dz in abdomen   (primary not identified)  Continue with IV diuresis  Increase to 80 bid  Follow renal function and electrolytes   2  HTN  BP is labile but mostly up   Pt had been on amlodipine as an outpt   With edema, reasonable to hold and continue hydralazine for now.   Would increase to 50 tid     For questions or updates, please contact [facility] Please consult [email or web address] for contact info under     Signed, Dorris Carnes, MD  09/11/2018 10:00 AM

## 2018-09-11 NOTE — Progress Notes (Signed)
PROGRESS NOTE  Carrie Chaney  ZOX:[id number] DOB: [birth year] DOA: 09/07/2018 PCP: Lucianne Lei, MD   Brief Narrative: Carrie Chaney is a 77 y.o. female with a history of HTN, HLD who presented for 4 weeks of progressive lower extremity swelling which continued despite starting bumex per PCP. This has caused limited mobility. CXR showed cardiomegaly, pulmonary congestion, albumin 2.3 with mild LFT elevations. BNP 299, CK 86, WBC 14k, creatinine 0.9, BUN 26. She was admitted for anasarca, started on IV diuretic, and work up included RUQ U/S which demonstrated multiple masses in the liver. Further work up is underway.  Assessment & Plan: Principal Problem:   Anasarca Active Problems:   Hypertension   Elevated liver enzymes   Proximal limb muscle weakness   Abnormal LFTs   Weakness  Anasarca, acute on chronic HFpEF: Grossly fluid overloaded based on CXR pulm edema and exam with significant pitting edema. Reported dry weight ~220lbs per patient and currently 255lbs. Suspect hypoalbuminemia and diastolic dysfunction on echo are primary contributors. Would expect prolonged PT/INR if hepatic synthetic capacity was truly decreased. No proteinuria noted. TSH wnl. Suspect BNP of 299 is suppressed due to obesity. - STILL struggling with documenting urine output. 1.2L during AM. Discussed with nursing staff, additional signage placed.  - Continue lasix 40mg  IV BID, seems to be making good UOP subjectively. Weights also not helpful. Will ask for cardiology assistance.  - Dietitian consulted, supplement protein with prostat  Metastatic cancer of unknown primary: Bulky metastatic disease in liver, essentially replaced right inferior liver on CT, extending to right kidney superior pole, also involving perigastric, portahepatis, and omentum.  - Follow up biopsy pathology results performed 2/21.  - Plan to involve oncology once tissue diagnosis is reached. This has been discussed with the patient.  -  CEA 10.1, AFP normal at 2.3, CA 19-9 normal at 1. No adnexal or pancreatic mass. - Mildly elevated LFTs. Will trend.  Sigmoid colon mass: 4cm on CT, per radiology, not typical appearance of colorectal primary. Reports cologuard screening "clear" 1 year ago.  - Reached out through EMR to PCP for colon cancer screening records. - CEA nonspecifically elevated at 10.1.  - If biopsy not helpful, would consult GI for biopsy   AKI: Due to diuresis, creatinine somewhat better 2/23.  - Continue to monitor with diuresis.   Leukocytosis: Unclear etiology. No cough, dysuria, wounds. Will continue monitoring and pan-culture if fever develops.   Abdominal pain: Multifactorial, likely related to degree of distention with bulky metastatic disease and anasarca. Also in consideration with loose stools, and leukocytosis, is colitis, though this did not appear on recent CT abd/pelvis and resolved spontaneously. - Can continue oxyIR (tolerated and effective for prior pains)  HTN:  - Holding norvasc for now. Could theoretically contribute to edema. Replaced with hydralazine with improvement, wide pulse pressures.   Hypokalemia: Improved on 117mEq total daily dose supplement. - K continues to trend upward, K 4.6 this AM. Will hold supplementation today and monitor in AM with Mg.  Obesity: BMI 38, though significant water weight. Though still will qualify for obesity. - Weight loss is recommended.  Hyperlipidemia:  - Holding statin  Proximal muscle weakness: CPK nl, TSH nl.  - PT/OT to continue, suspect would need SNF when ready.  DVT prophylaxis: Lovenox Code Status: Full Family Communication: None at bedside Disposition Plan: SNF once closer to euvolemia.  Consultants:   IR  Cardiology  Procedures:   Echocardiogram 09/08/2018:  1. The left ventricle has hyperdynamic systolic function, with  an ejection fraction of >65%. The cavity size was normal. There is moderately increased left ventricular  wall thickness. Left ventricular diastolic Doppler parameters are consistent with  impaired relaxation.  2. The right ventricle has normal systolic function. The cavity was mildly enlarged. There is no increase in right ventricular wall thickness.  3. The mitral valve is normal in structure.  4. The tricuspid valve is normal in structure.  5. The aortic valve is normal in structure.  6. The pulmonic valve was normal in structure.  09/09/2018 Dr. Annamaria Boots: S/p [place] LEFT LIVER MASS CORE BX  Antimicrobials:  None   Subjective: Had many visitors yesterday which was nice, denies any chest pain, palpitations. Had significant dyspnea with bed mobility. Hasn't paid attention but feels swelling in legs is a bit better.  Objective: Vitals:   09/10/18 1426 09/10/18 2028 09/11/18 0500 09/11/18 0518  BP: (!) 158/59 (!) 149/54  (!) 182/57  Pulse: 77 79  70  Resp: 20 (!) 22  16  Temp: 98.3 F (36.8 C)   98.3 F (36.8 C)  TempSrc: Oral     SpO2: 97% 90%  91%  Weight:   116.2 kg   Height:        Intake/Output Summary (Last 24 hours) at 09/11/2018 1210 Last data filed at 09/11/2018 1023 Gross per 24 hour  Intake 3 ml  Output 950 ml  Net -947 ml   Filed Weights   09/09/18 0518 09/10/18 0547 09/11/18 0500  Weight: 115.2 kg 114.7 kg 116.2 kg   Gen: Pleasant obese female in no distress Pulm: Nonlabored breathing room air. Clear, distant. CV: Regular rate and rhythm. No murmur, rub, or gallop. No JVD, still 3+ pitting dependent edema. GI: Abdomen soft, not significant tender, obese but non-distended, with normoactive bowel sounds.  Ext: Warm, no deformities Skin: No rashes, lesions or ulcers on visualized skin. Neuro: Alert and oriented. No focal neurological deficits. Psych: Judgement and insight appear fair. Mood euthymic & affect congruent. Behavior is appropriate.    Data Reviewed: I have personally reviewed following labs and imaging studies  CBC: Recent Labs  Lab 09/07/18 2203  09/09/18 0645 09/10/18 0543 09/11/18 0541  WBC 14.0* 14.3* 16.7* 17.4*  HGB 11.3* 10.9* 11.6* 11.4*  HCT 37.2 36.0 37.8 37.6  MCV 93.5 94.2 93.1 93.5  PLT 317 293 315 527   Basic Metabolic Panel: Recent Labs  Lab 09/07/18 2203 09/08/18 0652 09/09/18 0645 09/10/18 0543 09/11/18 0541  NA 143 145 144 145 143  K 4.8 3.0* 3.0* 3.9 4.6  CL 104 105 103 106 105  CO2 30 32 31 31 30   GLUCOSE 135* 131* 167* 152* 161*  BUN 26* 23 22 29* 33*  CREATININE 0.96 0.94 0.91 1.15* 1.01*  CALCIUM 8.1* 8.1* 8.0* 8.1* 8.2*  MG  --  2.0  --  2.1  --   PHOS  --  2.8  --   --   --    GFR: Estimated Creatinine Clearance: 61.5 mL/min (A) (by C-G formula based on SCr of 1.01 mg/dL (H)). Liver Function Tests: Recent Labs  Lab 09/07/18 2203 09/08/18 0652  AST 59* 32  ALT 45* 47*  ALKPHOS 254* 239*  BILITOT 2.2* 1.2  PROT 5.6* 5.5*  ALBUMIN 2.3* 2.3*   No results for input(s): LIPASE, AMYLASE in the last 168 hours. No results for input(s): AMMONIA in the last 168 hours. Coagulation Profile: Recent Labs  Lab 09/08/18 0652  INR 1.04   Cardiac Enzymes: Recent Labs  Lab 09/07/18 2308  CKTOTAL 86   BNP (last 3 results) No results for input(s): PROBNP in the last 8760 hours. HbA1C: No results for input(s): HGBA1C in the last 72 hours. CBG: No results for input(s): GLUCAP in the last 168 hours. Lipid Profile: No results for input(s): CHOL, HDL, LDLCALC, TRIG, CHOLHDL, LDLDIRECT in the last 72 hours. Thyroid Function Tests: No results for input(s): TSH, T4TOTAL, FREET4, T3FREE, THYROIDAB in the last 72 hours. Anemia Panel: No results for input(s): VITAMINB12, FOLATE, FERRITIN, TIBC, IRON, RETICCTPCT in the last 72 hours. Urine analysis:    Component Value Date/Time   COLORURINE YELLOW 09/08/2018 [place] 09/08/2018 0447   LABSPEC 1.008 09/08/2018 0447   PHURINE 7.0 09/08/2018 [place] 09/08/2018 0447   HGBUR NEGATIVE 09/08/2018 [place] 09/08/2018 [place] 09/08/2018 0447   PROTEINUR NEGATIVE 09/08/2018 0447   NITRITE NEGATIVE 09/08/2018 0447   LEUKOCYTESUR NEGATIVE 09/08/2018 0447   No results found for this or any previous visit (from the past 240 hour(s)).    Radiology Studies: US Biopsy (liver)  Result Date: 09/09/2018 INDICATION: LIVER MASS, SUSPECT COLON PRIMARY EXAM: ULTRASOUND LEFT LIVER MASS BIOPSY MEDICATIONS: 1% lidocaine local ANESTHESIA/SEDATION: Moderate (conscious) sedation was employed during this procedure. A total of Versed 1.0 mg and Fentanyl 50 mcg was administered intravenously. Moderate Sedation Time: 10 minutes. The patient's level of consciousness and vital signs were monitored continuously by radiology nursing throughout the procedure under my direct supervision. FLUOROSCOPY TIME:  Fluoroscopy Time: None. COMPLICATIONS: None immediate. PROCEDURE: Informed written consent was obtained from the patient after a thorough discussion of the procedural risks, benefits and alternatives. All questions were addressed. Maximal Sterile Barrier Technique was utilized including caps, mask, sterile gowns, sterile gloves, sterile drape, hand hygiene and skin antiseptic. A timeout was performed prior to the initiation of the procedure. Previous imaging reviewed. Patient positioned supine. Preliminary ultrasound performed. A left lobe lesion is demonstrated in the epigastric region. Overlying skin marked. Under sterile conditions and local anesthesia, a 17 gauge 6.8 cm access needle was advanced from an anterior epigastric approach to the lesion. Needle position confirmed with ultrasound. 18 gauge core biopsies obtained under direct ultrasound. Images obtained for documentation. Samples were placed in formalin. Needle removed. Patient tolerated the biopsy well. No immediate complication IMPRESSION: Successful ultrasound left liver mass 18 gauge core biopsy Electronically Signed   By: Jerilynn Mages.  Shick M.D.   On:  09/09/2018 14:40    Scheduled Meds: . enoxaparin (LOVENOX) injection  40 mg Subcutaneous Daily  . feeding supplement (PRO-STAT SUGAR FREE 64)  30 mL Oral Daily  . furosemide  40 mg Intravenous BID  . hydrALAZINE  25 mg Oral Q8H  . multivitamin with minerals  1 tablet Oral Daily  . sodium chloride flush  3 mL Intravenous Q12H   Continuous Infusions: . sodium chloride Stopped (09/09/18 1241)     LOS: 3 days   Time spent: 35 minutes.  Patrecia Pour, MD [facility] [email or web address] Password [facility] 09/11/2018, 12:10 PM

## 2018-09-12 ENCOUNTER — Inpatient Hospital Stay (HOSPITAL_COMMUNITY): Payer: Medicare Other

## 2018-09-12 DIAGNOSIS — M7989 Other specified soft tissue disorders: Secondary | ICD-10-CM

## 2018-09-12 LAB — COMPREHENSIVE METABOLIC PANEL
ALT: 47 U/L — ABNORMAL HIGH (ref 0–44)
AST: 43 U/L — ABNORMAL HIGH (ref 15–41)
Albumin: 2.1 g/dL — ABNORMAL LOW (ref 3.5–5.0)
Alkaline Phosphatase: 238 U/L — ABNORMAL HIGH (ref 38–126)
Anion gap: 11 (ref 5–15)
BUN: 36 mg/dL — ABNORMAL HIGH (ref 8–23)
CO2: 29 mmol/L (ref 22–32)
Calcium: 8.2 mg/dL — ABNORMAL LOW (ref 8.9–10.3)
Chloride: 104 mmol/L (ref 98–111)
Creatinine, Ser: 1.02 mg/dL — ABNORMAL HIGH (ref 0.44–1.00)
GFR calc non Af Amer: 53 mL/min — ABNORMAL LOW (ref >60)
Glucose, Bld: 134 mg/dL — ABNORMAL HIGH (ref 70–99)
Potassium: 3.6 mmol/L (ref 3.5–5.1)
Sodium: 144 mmol/L (ref 135–145)
Total Bilirubin: 1.1 mg/dL (ref 0.3–1.2)
Total Protein: 5.6 g/dL — ABNORMAL LOW (ref 6.5–8.1)

## 2018-09-12 LAB — MAGNESIUM: Magnesium: 2 mg/dL (ref 1.7–2.4)

## 2018-09-12 MED ORDER — POTASSIUM CHLORIDE CRYS ER 20 MEQ PO TBCR
40.0000 meq | EXTENDED_RELEASE_TABLET | Freq: Every day | ORAL | Status: DC
  Administered 2018-09-12: 40 meq via ORAL
  Filled 2018-09-12: qty 2

## 2018-09-12 MED ORDER — SIMETHICONE 80 MG PO CHEW
80.0000 mg | CHEWABLE_TABLET | Freq: Four times a day (QID) | ORAL | Status: DC
  Administered 2018-09-12 – 2018-09-19 (×20): 80 mg via ORAL
  Filled 2018-09-12 (×24): qty 1

## 2018-09-12 NOTE — Care Management Important Message (Signed)
Important Message  Patient Details  Name: Carrie Chaney MRN: [id number] Date of Birth: 1940-09-23   Medicare Important Message Given:  Yes    Kerin Salen 09/12/2018, 12:11 [place] Message  Patient Details  Name: Carrie Chaney MRN: [id number] Date of Birth: 07/06/1941   Medicare Important Message Given:  Yes    Kerin Salen 09/12/2018, 12:11 PM

## 2018-09-12 NOTE — Progress Notes (Addendum)
PROGRESS NOTE  Carrie Chaney  FTD:[id number] DOB: Dec 15, 1940 DOA: 09/07/2018 PCP: Lucianne Lei, MD   Brief Narrative: Carrie Chaney is a 78 y.o. female with a history of HTN, HLD who presented for 4 weeks of progressive lower extremity swelling which continued despite starting bumex per PCP. This has caused limited mobility. CXR showed cardiomegaly, pulmonary congestion, albumin 2.3 with mild LFT elevations. BNP 299, CK 86, WBC 14k, creatinine 0.9, BUN 26. She was admitted for anasarca, started on IV diuretic, and work up included RUQ U/S which demonstrated multiple masses in the liver. Further work up is underway.  Assessment & Plan: Principal Problem:   Anasarca Active Problems:   Hypertension   Elevated liver enzymes   Proximal limb muscle weakness   Abnormal LFTs   Weakness  Anasarca, acute on chronic HFpEF: Grossly fluid overloaded based on CXR pulm edema and exam with significant pitting edema. Reported dry weight ~220lbs per patient and currently 255lbs. Suspect hypoalbuminemia and diastolic dysfunction on echo are primary contributors. Would expect prolonged PT/INR if hepatic synthetic capacity was truly decreased. No proteinuria noted. TSH wnl. Suspect BNP of 299 is suppressed due to obesity. - -~4L /24hrs with augmentation of lasix. Weight down 255lbs > 248lbs on 2/24. Still grossly overloaded.  - Continue lasix 80mg  IV BID, appreciate cardiology recommendations. - Dietitian consulted, supplement protein with prostat  Metastatic cancer of unknown primary: Bulky metastatic disease in liver, essentially replaced right inferior liver on CT, extending to right kidney superior pole, also involving perigastric, portahepatis, and omentum.  - Follow up biopsy pathology results performed 2/21. Spoke with pathology. Dr. Lyndon Code anticipates results tomorrow. Has sent sample for staining. - Plan to involve oncology once tissue diagnosis is reached. This has been discussed with the patient.   - CEA 10.1, AFP normal at 2.3, CA 19-9 normal at 1. No adnexal or pancreatic mass. - Mildly elevated LFTs. Will trend.  Sigmoid colon mass: 4cm on CT, per radiology, not typical appearance of colorectal primary. Reports cologuard screening "clear" 1 year ago.  - Reached out through EMR to PCP for colon cancer screening records. - CEA nonspecifically elevated at 10.1.  - If biopsy not helpful, would consult GI for biopsy   SVT: Short runs on telemetry.  - Defer to cardiology. Will keep electrolytes wnl and consider starting beta blocker.  AKI: Mild.  - Continue to monitor with diuresis.   Leukocytosis: Unclear etiology. No cough, dysuria, wounds. Will continue monitoring and pan-culture if fever develops.  - Recheck 2/25.  Abdominal pain: Multifactorial, likely related to degree of distention with bulky metastatic disease and anasarca. Also in consideration with loose stools, and leukocytosis, is colitis, though this did not appear on recent CT abd/pelvis and resolved spontaneously. - Can continue oxyIR (tolerated and effective for prior pains)  HTN:  - Holding norvasc for now. Could theoretically contribute to edema. Replaced with hydralazine with improvement, wide pulse pressures, increased dose 2/24.   Hypokalemia: Improved on 167mEq total daily dose supplement. - Continue potassium supp daily. Mg 2.0.   Obesity: BMI 37, though significant water weight. Though still will qualify for obesity. - Weight loss is recommended.  Hyperlipidemia:  - Holding statin  Proximal muscle weakness: CPK nl, TSH nl.  - PT/OT to continue, suspect would need SNF when ready.  DVT prophylaxis: Lovenox Code Status: Full Family Communication: Sister at bedside Disposition Plan: SNF once closer to euvolemia.  Consultants:   IR  Cardiology  Procedures:   Echocardiogram 09/08/2018:  1. The left  ventricle has hyperdynamic systolic function, with an ejection fraction of >65%. The cavity size  was normal. There is moderately increased left ventricular wall thickness. Left ventricular diastolic Doppler parameters are consistent with  impaired relaxation.  2. The right ventricle has normal systolic function. The cavity was mildly enlarged. There is no increase in right ventricular wall thickness.  3. The mitral valve is normal in structure.  4. The tricuspid valve is normal in structure.  5. The aortic valve is normal in structure.  6. The pulmonic valve was normal in structure.  09/09/2018 Dr. Annamaria Boots: S/p [place] LEFT LIVER MASS CORE BX  Antimicrobials:  None   Subjective: Noticing increased swelling in right hand, though legs and other arm seem to have improved swelling. Not getting up much, but wants to. +Dyspnea and difficult to lift swollen legs.   Objective: Vitals:   09/11/18 2135 09/12/18 0411 09/12/18 0611 09/12/18 1255  BP: (!) 153/58  (!) 161/55 (!) 162/56  Pulse: 78  81 75  Resp: 16  18   Temp: 98.5 F (36.9 C)  98.3 F (36.8 C)   TempSrc: Oral  Oral   SpO2: 94%  97% 93%  Weight:  112.7 kg    Height:        Intake/Output Summary (Last 24 hours) at 09/12/2018 1418 Last data filed at 09/12/2018 1258 Gross per 24 hour  Intake 900 ml  Output 2500 ml  Net -1600 ml   Filed Weights   09/10/18 0547 09/11/18 0500 09/12/18 0411  Weight: 114.7 kg 116.2 kg 112.7 kg   Gen: Pleasant, obese 78 y.o. female in no distress Pulm: Nonlabored breathing room air. Clear. CV: Regular rate and rhythm. No murmur, rub, or gallop. + JVD, 2+ LE dependent edema. GI: Abdomen soft, not focally tender, non-distended, with normoactive bowel sounds.  Ext: Warm, no deformities. RUE with nonpitting edema, nontender, PIV in right forearm Skin: No rashes, lesions or ulcers on visualized skin. Neuro: Alert and oriented. No focal neurological deficits. Psych: Judgement and insight appear fair. Mood euthymic & affect congruent. Behavior is appropriate.   Data Reviewed: I have personally  reviewed following labs and imaging studies  CBC: Recent Labs  Lab 09/07/18 2203 09/09/18 0645 09/10/18 0543 09/11/18 0541  WBC 14.0* 14.3* 16.7* 17.4*  HGB 11.3* 10.9* 11.6* 11.4*  HCT 37.2 36.0 37.8 37.6  MCV 93.5 94.2 93.1 93.5  PLT 317 293 315 102   Basic Metabolic Panel: Recent Labs  Lab 09/08/18 0652 09/09/18 0645 09/10/18 0543 09/11/18 0541 09/12/18 0707  NA 145 144 145 143 144  K 3.0* 3.0* 3.9 4.6 3.6  CL 105 103 106 105 104  CO2 32 31 31 30 29   GLUCOSE 131* 167* 152* 161* 134*  BUN 23 22 29* 33* 36*  CREATININE 0.94 0.91 1.15* 1.01* 1.02*  CALCIUM 8.1* 8.0* 8.1* 8.2* 8.2*  MG 2.0  --  2.1  --  2.0  PHOS 2.8  --   --   --   --    GFR: Estimated Creatinine Clearance: 59.8 mL/min (A) (by C-G formula based on SCr of 1.02 mg/dL (H)). Liver Function Tests: Recent Labs  Lab 09/07/18 2203 09/08/18 0652 09/12/18 0707  AST 59* 32 43*  ALT 45* 47* 47*  ALKPHOS 254* 239* 238*  BILITOT 2.2* 1.2 1.1  PROT 5.6* 5.5* 5.6*  ALBUMIN 2.3* 2.3* 2.1*   Coagulation Profile: Recent Labs  Lab 09/08/18 0652  INR 1.04   Cardiac Enzymes: Recent Labs  Lab 09/07/18  2308  CKTOTAL 86   Urine analysis:    Component Value Date/Time   COLORURINE YELLOW 09/08/2018 [place] 09/08/2018 0447   LABSPEC 1.008 09/08/2018 0447   PHURINE 7.0 09/08/2018 [place] 09/08/2018 0447   HGBUR NEGATIVE 09/08/2018 [place] 09/08/2018 [place] 09/08/2018 0447   [place] NEGATIVE 09/08/2018 0447   NITRITE NEGATIVE 09/08/2018 [place] 09/08/2018 0447   Radiology Studies: No results found.  Scheduled Meds: . enoxaparin (LOVENOX) injection  40 mg Subcutaneous Daily  . feeding supplement (PRO-STAT SUGAR FREE 64)  30 mL Oral Daily  . furosemide  80 mg Intravenous BID  . hydrALAZINE  50 mg Oral Q8H  . multivitamin with minerals  1 tablet Oral Daily  . simethicone  80 mg Oral QID  . sodium  chloride flush  3 mL Intravenous Q12H   Continuous Infusions: . sodium chloride Stopped (09/09/18 1241)     LOS: 4 days   Time spent: 35 minutes.  Patrecia Pour, MD [facility] [email or web address] Password [email or web address] 09/12/2018, 2:18 PM

## 2018-09-12 NOTE — Progress Notes (Signed)
Right upper extremity venous duplex has been completed. Preliminary results can be found in CV Proc through chart review.   09/12/18 3:15 PM Carrie Chaney RVT

## 2018-09-12 NOTE — Progress Notes (Signed)
Physical Therapy Treatment Patient Details Name: Carrie Chaney MRN: [id number] DOB: Sep 03, 1940 Today's Date: 09/12/2018    History of Present Illness 78 yo female admitted with progressive weakness, falls, bil LE edema, anasarca, CHF. 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 Hx of Dm, CHF.     PT Comments    Patient seen for mobility progression. Patient requiring increased assistance for bed level mobility and transfers with further mobility deferred due to increase in HR (up to 140) with limited mobility. Nursing present in room following transfers with nursing assessing vitals and VSS. Required Mod/Max A+2 for bed mobility and stand pivot transfer with cueing throughout for safety and sequencing. Patient up in chair with all needs met with nursing in room. PT to continue to follow.   Follow Up Recommendations  SNF;Supervision/Assistance - 24 hour     Equipment Recommendations  Rolling walker with 5" wheels    Recommendations for Other Services       Precautions / Restrictions Precautions Precautions: Fall Restrictions Weight Bearing Restrictions: No    Mobility  Bed Mobility Overal bed mobility: Needs Assistance Bed Mobility: Supine to Sit     Supine to sit: HOB elevated;Mod assist;+2 for physical assistance     General bed mobility comments: required physical assist for B LE and trunk management; use of bed pad to complete successfully  Transfers Overall transfer level: Needs assistance Equipment used: Rolling walker (2 wheeled) Transfers: Sit to/from [facility] Sit to Stand: Mod assist;Max assist;+2 physical assistance Stand pivot transfers: Mod assist;Max assist;+2 physical assistance       General transfer comment: increased assist to rise form bedisde x 2 attempts. Patient requiring verbal cueing for hand placement and sequencing with transfer  Ambulation/Gait             General Gait Details:  deferred   Stairs             Wheelchair Mobility    Modified Rankin (Stroke Patients Only)       Balance Overall balance assessment: Needs assistance;History of Falls Sitting-balance support: Bilateral upper extremity supported;Feet supported Sitting balance-Leahy Scale: Fair     Standing balance support: Bilateral upper extremity supported;During functional activity Standing balance-Leahy Scale: Poor Standing balance comment: reliance on RW                            Cognition Arousal/Alertness: Awake/alert Behavior During Therapy: WFL for tasks assessed/performed Overall Cognitive Status: Impaired/Different from baseline                                 General Comments: patient stating she has not been able to stand or walk since admission, however has ambulated with PT staff recently; when asking her to perform activities she only says "I can't" and waits for physical assist; when asking if she needed anything she replies with "I don't know if I need anything"      Exercises      General Comments General comments (skin integrity, edema, etc.): nursing staff in room as patients HR elevated to ~140 with bed to recliner transfer; nursing assessing vitals with HR dropping to 106, SpO2 >95% on RA and BP 146/57      Pertinent Vitals/Pain Pain Assessment: No/denies pain    Home Living  Prior Function            PT Goals (current goals can now be found in the care plan section) Acute Rehab PT Goals PT Goal Formulation: With patient Time For Goal Achievement: 09/23/18 Potential to Achieve Goals: Good Progress towards PT goals: Not progressing toward goals - comment(seemed slightly confused? now with R UE swelling)    Frequency    Min 3X/week      PT Plan Current plan remains appropriate    Co-evaluation              AM-PAC PT "6 Clicks" Mobility   Outcome Measure  Help needed turning  from your back to your side while in a flat bed without using bedrails?: A Lot Help needed moving from lying on your back to sitting on the side of a flat bed without using bedrails?: A Lot Help needed moving to and from a bed to a chair (including a wheelchair)?: A Lot Help needed standing up from a chair using your arms (e.g., wheelchair or bedside chair)?: Total Help needed to walk in hospital room?: Total Help needed climbing 3-5 steps with a railing? : Total 6 Click Score: 9    End of Session Equipment Utilized During Treatment: Gait belt Activity Tolerance: Patient tolerated treatment well Patient left: in chair;with call bell/phone within reach;with chair alarm set;with nursing/sitter in room Nurse Communication: Mobility status PT Visit Diagnosis: Muscle weakness (generalized) (M62.81);History of falling (Z91.81);Unsteadiness on feet (R26.81);Difficulty in walking, not elsewhere classified (R26.2);Pain     Time: 1345-1403 PT Time Calculation (min) (ACUTE ONLY): 18 min  Charges:  $Therapeutic Activity: 8-22 mins                     Lanney Gins, PT, DPT Supplemental Physical Therapist 09/12/18 2:44 PM Pager: [phone] Office: [phone]

## 2018-09-13 ENCOUNTER — Encounter (HOSPITAL_COMMUNITY): Payer: Self-pay | Admitting: Oncology

## 2018-09-13 DIAGNOSIS — R945 Abnormal results of liver function studies: Secondary | ICD-10-CM

## 2018-09-13 DIAGNOSIS — R748 Abnormal levels of other serum enzymes: Secondary | ICD-10-CM

## 2018-09-13 DIAGNOSIS — C7901 Secondary malignant neoplasm of right kidney and renal pelvis: Secondary | ICD-10-CM

## 2018-09-13 DIAGNOSIS — C801 Malignant (primary) neoplasm, unspecified: Secondary | ICD-10-CM

## 2018-09-13 DIAGNOSIS — C787 Secondary malignant neoplasm of liver and intrahepatic bile duct: Secondary | ICD-10-CM

## 2018-09-13 DIAGNOSIS — R601 Generalized edema: Secondary | ICD-10-CM

## 2018-09-13 DIAGNOSIS — C799 Secondary malignant neoplasm of unspecified site: Secondary | ICD-10-CM

## 2018-09-13 LAB — BASIC METABOLIC PANEL
ANION GAP: 10 (ref 5–15)
BUN: 39 mg/dL — ABNORMAL HIGH (ref 8–23)
CO2: 29 mmol/L (ref 22–32)
Calcium: 8.1 mg/dL — ABNORMAL LOW (ref 8.9–10.3)
Chloride: 105 mmol/L (ref 98–111)
Creatinine, Ser: 1.01 mg/dL — ABNORMAL HIGH (ref 0.44–1.00)
GFR calc Af Amer: >60 mL/min (ref >60)
GFR calc non Af Amer: 53 mL/min — ABNORMAL LOW (ref >60)
Glucose, Bld: 142 mg/dL — ABNORMAL HIGH (ref 70–99)
Potassium: 5.1 mmol/L (ref 3.5–5.1)
Sodium: 144 mmol/L (ref 135–145)

## 2018-09-13 LAB — CBC
HCT: 37.3 % (ref 36.0–46.0)
Hemoglobin: 11.2 g/dL — ABNORMAL LOW (ref 12.0–15.0)
MCH: 28.4 pg (ref 26.0–34.0)
MCHC: 30 g/dL (ref 30.0–36.0)
MCV: 94.4 fL (ref 80.0–100.0)
PLATELETS: 346 10*3/uL (ref 150–400)
RBC: 3.95 MIL/uL (ref 3.87–5.11)
RDW: 17.6 % — ABNORMAL HIGH (ref 11.5–15.5)
WBC: 12.3 10*3/uL — ABNORMAL HIGH (ref 4.0–10.5)
nRBC: 0.8 % — ABNORMAL HIGH (ref 0.0–0.2)

## 2018-09-13 MED ORDER — SPIRONOLACTONE 25 MG PO TABS
25.0000 mg | ORAL_TABLET | Freq: Every day | ORAL | Status: DC
  Administered 2018-09-13 – 2018-09-20 (×8): 25 mg via ORAL
  Filled 2018-09-13 (×8): qty 1

## 2018-09-13 MED ORDER — AMLODIPINE BESYLATE 5 MG PO TABS
2.5000 mg | ORAL_TABLET | Freq: Every day | ORAL | Status: DC
  Administered 2018-09-13 – 2018-09-14 (×2): 2.5 mg via ORAL
  Filled 2018-09-13 (×2): qty 1

## 2018-09-13 NOTE — Progress Notes (Addendum)
Progress Note  Patient Name: Carrie Chaney Date of Encounter: 09/13/2018  Primary Cardiologist: Dorris Carnes, MD   Subjective   No pain or SOB + edema   Inpatient Medications    Scheduled Meds: . enoxaparin (LOVENOX) injection  40 mg Subcutaneous Daily  . feeding supplement (PRO-STAT SUGAR FREE 64)  30 mL Oral Daily  . furosemide  80 mg Intravenous BID  . hydrALAZINE  50 mg Oral Q8H  . multivitamin with minerals  1 tablet Oral Daily  . simethicone  80 mg Oral QID  . sodium chloride flush  3 mL Intravenous Q12H   Continuous Infusions: . sodium chloride Stopped (09/09/18 1241)   PRN Meds: sodium chloride, acetaminophen, ondansetron (ZOFRAN) IV, oxyCODONE, sodium chloride flush   Vital Signs    Vitals:   09/12/18 1255 09/12/18 2111 09/13/18 0500 09/13/18 0501  BP: (!) 162/56 (!) 160/44  (!) 173/50  Pulse: 75 77  77  Resp:  16  20  Temp:  98.5 F (36.9 C)  98.2 F (36.8 C)  TempSrc:      SpO2: 93% 97%  91%  Weight:   113.6 kg   Height:        Intake/Output Summary (Last 24 hours) at 09/13/2018 1221 Last data filed at 09/13/2018 0535 Gross per 24 hour  Intake 480 ml  Output 2130 ml  Net -1650 ml   Last 3 Weights 09/13/2018 09/12/2018 09/11/2018  Weight (lbs) 250 lb 7.1 oz 248 lb 7.3 oz 256 lb 2.8 oz  Weight (kg) 113.6 kg 112.7 kg 116.2 kg      Telemetry    SR with bursts of PSVT 2/24 at 0825 or so - Personally Reviewed  ECG    No new - Personally Reviewed  Physical Exam   GEN: No acute distress.   Neck: No JVD Cardiac: RRR, no murmurs, rubs, or gallops.  Respiratory: Clear to auscultation bilaterally. GI: Soft, nontender, non-distended  MS: 3+ edema to lower abd; No deformity. Neuro:  Nonfocal  Psych: Normal affect   Labs    Chemistry Recent Labs  Lab 09/07/18 2203 09/08/18 7253  09/11/18 0541 09/12/18 0707 09/13/18 0648  NA 143 145   < > 143 144 144  K 4.8 3.0*   < > 4.6 3.6 5.1  CL 104 105   < > 105 104 105  CO2 30 32   < > 30 29 29    GLUCOSE 135* 131*   < > 161* 134* 142*  BUN 26* 23   < > 33* 36* 39*  CREATININE 0.96 0.94   < > 1.01* 1.02* 1.01*  CALCIUM 8.1* 8.1*   < > 8.2* 8.2* 8.1*  PROT 5.6* 5.5*  --   --  5.6*  --   ALBUMIN 2.3* 2.3*  --   --  2.1*  --   AST 59* 32  --   --  43*  --   ALT 45* 47*  --   --  47*  --   ALKPHOS 254* 239*  --   --  238*  --   BILITOT 2.2* 1.2  --   --  1.1  --   GFRNONAA 57* 58*   < > 53* 53* 53*  GFRAA >60 >60   < > >60 >60 >60  ANIONGAP 9 8   < > 8 11 10    < > = values in this interval not displayed.     Hematology Recent Labs  Lab 09/10/18 0543 09/11/18 6644  09/13/18 0648  WBC 16.7* 17.4* 12.3*  RBC 4.06 4.02 3.95  HGB 11.6* 11.4* 11.2*  HCT 37.8 37.6 37.3  MCV 93.1 93.5 94.4  MCH 28.6 28.4 28.4  MCHC 30.7 30.3 30.0  RDW 17.7* 17.3* 17.6*  PLT 315 329 346    Cardiac EnzymesNo results for input(s): TROPONINI in the last 168 hours. No results for input(s): TROPIPOC in the last 168 hours.   BNP Recent Labs  Lab 09/07/18 2203  BNP 299.9*     DDimer No results for input(s): DDIMER in the last 168 hours.   Radiology    Vas [place] Upper Extremity Venous Duplex  Result Date: 09/12/2018 UPPER VENOUS STUDY  Indications: Swelling Performing Technologist: Oliver Hum RVT  Examination Guidelines: A complete evaluation includes B-mode imaging, spectral Doppler, color Doppler, and power Doppler as needed of all accessible portions of each vessel. Bilateral testing is considered an integral part of a complete examination. Limited examinations for reoccurring indications may be performed as noted.  Right Findings: +----------+------------+----------+---------+-----------+-------+ RIGHT     CompressiblePropertiesPhasicitySpontaneousSummary +----------+------------+----------+---------+-----------+-------+ IJV           Full                 Yes       Yes            +----------+------------+----------+---------+-----------+-------+ Subclavian    Full                  Yes       Yes            +----------+------------+----------+---------+-----------+-------+ Axillary      Full                 Yes       Yes            +----------+------------+----------+---------+-----------+-------+ Brachial      Full                 Yes       Yes            +----------+------------+----------+---------+-----------+-------+ Radial        Full                                          +----------+------------+----------+---------+-----------+-------+ Ulnar         Full                                          +----------+------------+----------+---------+-----------+-------+ Cephalic      Full                                          +----------+------------+----------+---------+-----------+-------+ Basilic       Full                                          +----------+------------+----------+---------+-----------+-------+  Left Findings: +----------+------------+----------+---------+-----------+-------+ LEFT      CompressiblePropertiesPhasicitySpontaneousSummary +----------+------------+----------+---------+-----------+-------+ Subclavian    Full                 Yes  Yes            +----------+------------+----------+---------+-----------+-------+  Summary:  Right: No evidence of deep vein thrombosis in the upper extremity. No evidence of superficial vein thrombosis in the upper extremity.  Left: No evidence of thrombosis in the subclavian.  *See table(s) above for measurements and observations.  Diagnosing physician: Ruta Hinds MD Electronically signed by Ruta Hinds MD on 09/12/2018 at 3:30:20 PM.    Final     Cardiac Studies   Echo:   2/1. The left ventricle has hyperdynamic systolic function, with an ejection fraction of >65%. The cavity size was normal. There is moderately increased left ventricular wall thickness. Left ventricular diastolic Doppler parameters are consistent with  impaired relaxation. 2. The  right ventricle has normal systolic function. The cavity was mildly enlarged. There is no increase in right ventricular wall thickness. 3. The mitral valve is normal in structure. 4. The tricuspid valve is normal in structure. 5. The aortic valve is normal in structure. 6. The pulmonic valve was normal in structure. 20/20   Patient Profile     78 y.o. female with a hx of HTN now admitted with edema in setting of hypoalbumenemia and diffuse metastatic dz in abd.    Assessment & Plan    Edema on lasix 80 BID and is neg 7260 since admit.  Wt down from kp of 116.2 Kg to 113.6.   Would continue lasix for 24-48 hours. Cr 1.01 down from 1.15 at pk.  + metastatic disease in liver with inferior Rt liver essentially replaced by tumor.  HTN  Elevated 173/50  To 160/44 on hydralazine 50 mg every 8 hours --amlodipine was stopped due to lower ext edema   PSVT yesterday with HR 170 add BB- defer to Dr. Meda Coffee  hypoalbuminemia with albumin 2.1   HLD on lipitor continue    For questions or updates, please contact [place] Please consult [email or web address] for contact info under     Signed, Cecilie Kicks, NP  09/13/2018, 12:21 PM    The patient was seen, examined and discussed with Cecilie Kicks, NP and I agree with the above.   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  Primary tumor is unknown so far, this is associated with anasarca, that is most probably related to cancer and hypoalbuminemia.  She has normal biventricular systolic function and grade 1 diastolic dysfunction that is age-appropriate. She was started on Lasix IV twice daily with -7 L since admission, her creatinine is stable, I would continue the same regimen of Lasix and add  spironolactone 25 mg daily, we will follow potassium closely, Her baseline weight in September 2019 was 225 pounds, she was admitted with 260 pounds, now down to 250 pounds but she still has ways to go.  I would add amlodipine 2.5 mg daily for blood pressure management.  Apply TED hoses.   Ena Dawley, MD 09/13/2018

## 2018-09-13 NOTE — Progress Notes (Addendum)
PROGRESS NOTE  Carrie Chaney  VEH:[id number] DOB: [birth year] DOA: 09/07/2018 PCP: Lucianne Lei, MD   Brief Narrative: Carrie Chaney is a 78 y.o. female with a history of HTN, HLD who presented for 4 weeks of progressive lower extremity swelling which continued despite starting bumex per PCP. This has caused limited mobility. CXR showed cardiomegaly, pulmonary congestion, albumin 2.3 with mild LFT elevations. BNP 299, CK 86, WBC 14k, creatinine 0.9, BUN 26. She was admitted for anasarca, started on IV diuretic, and work up included RUQ U/S which demonstrated multiple masses in the liver. Further work up is underway.  Assessment & Plan: Principal Problem:   Anasarca Active Problems:   Hypertension   Elevated liver enzymes   Proximal limb muscle weakness   Abnormal LFTs   Weakness  Anasarca, acute on chronic HFpEF: Grossly fluid overloaded based on CXR pulm edema and exam with significant pitting edema. Reported dry weight ~220lbs per patient and currently 255lbs. Suspect hypoalbuminemia and diastolic dysfunction on echo are primary contributors. Would expect prolonged PT/INR if hepatic synthetic capacity was truly decreased. No proteinuria noted. TSH wnl. Suspect BNP of 299 is suppressed due to obesity. - Net -7.6L (suspect much more due to incomplete charting. Creatinine stable, rising BUN. Still grossly overloaded, so plan to stay with lasix 80mg  IV BID, though defer to cardiology.  - Dietitian consulted, supplement protein with prostat  Metastatic cancer of unknown primary: Bulky metastatic disease in liver, essentially replaced right inferior liver on CT, extending to right kidney superior pole, also involving perigastric, portahepatis, and omentum.  - Pathology results returned, ?if this is sufficient to begin treatment as no primary of adenoCA identified. I have consulted oncology who will discuss with the patient. - CEA 10.1, AFP normal at 2.3, CA 19-9 normal at 1. No adnexal or  pancreatic mass.  Sigmoid colon mass: 4cm on CT, per radiology, not typical appearance of colorectal primary. Reports cologuard screening "clear" 1 year ago.  - Reached out through EMR to PCP for colon cancer screening records. - CEA nonspecifically elevated at 10.1. Biopsy states cell staining is not consistent with colon primary.   SVT: Short runs on telemetry. Personally reviewed telemetry today showing mostly NSR, short SVT up to 170bpm briefly. - Defer to cardiology. Will keep electrolytes wnl and consider starting beta blocker. - Continue telemetry.  AKI: Mild.  - Continue to monitor with diuresis. Stable  Leukocytosis: Unclear etiology. No cough, dysuria, wounds. Improved without antimicrobial therapy and no fever.  - Low threshold to check cultures if develops a fever or localizing signs/symptoms.   Right upper extremity swelling: Improved with elevation, no SVT or DVT on U/S 2/24.  Abdominal pain: Multifactorial, likely related to degree of distention with bulky metastatic disease and anasarca. Also in consideration with loose stools, and leukocytosis, is colitis, though this did not appear on recent CT abd/pelvis and resolved spontaneously. - Can continue oxyIR (tolerated and effective for prior pains)  HTN:  - Holding norvasc for now. Could theoretically contribute to edema. Replaced with hydralazine with improvement, wide pulse pressures, increased dose 2/24. Will need additional agent. With SVT, feel BB would be appropriate. Cardiology following.   Hypokalemia: - K 5.1 today, hold replacement, will monitor and give lasix.   Obesity: BMI 38, though significant water weight. Though still will qualify for obesity. - Weight loss is recommended.  Hyperlipidemia:  - Holding statin  Proximal muscle weakness: CPK nl, TSH nl.  - PT/OT to continue, suspect would need SNF when ready.  DVT prophylaxis: Lovenox Code Status: Full Family Communication: None at bedside Disposition  Plan: SNF once closer to euvolemia. Continue IV lasix and get oncology evaluation.  Consultants:   IR  Cardiology  Procedures:   Echocardiogram 09/08/2018:  1. The left ventricle has hyperdynamic systolic function, with an ejection fraction of >65%. The cavity size was normal. There is moderately increased left ventricular wall thickness. Left ventricular diastolic Doppler parameters are consistent with  impaired relaxation.  2. The right ventricle has normal systolic function. The cavity was mildly enlarged. There is no increase in right ventricular wall thickness.  3. The mitral valve is normal in structure.  4. The tricuspid valve is normal in structure.  5. The aortic valve is normal in structure.  6. The pulmonic valve was normal in structure.  09/09/2018 Dr. Annamaria Boots: S/p [place] LEFT LIVER MASS CORE BX  Antimicrobials:  None   Subjective: Feels swelling in legs is significantly improved. Right hand also going down with elevation. Urinating a significant amount. Did get up with PT yesterday. Some dyspnea with that and remains weaker than PLOF.  Objective: Vitals:   09/12/18 2111 09/13/18 0500 09/13/18 0501 09/13/18 1342  BP: (!) 160/44  (!) 173/50 (!) 170/50  Pulse: 77  77 81  Resp: 16  20 16   Temp: 98.5 F (36.9 C)  98.2 F (36.8 C) 98.9 F (37.2 C)  TempSrc:      SpO2: 97%  91% 91%  Weight:  113.6 kg    Height:        Intake/Output Summary (Last 24 hours) at 09/13/2018 1522 Last data filed at 09/13/2018 1254 Gross per 24 hour  Intake 248 ml  Output 1530 ml  Net -1282 ml   Filed Weights   09/11/18 0500 09/12/18 0411 09/13/18 0500  Weight: 116.2 kg 112.7 kg 113.6 kg  Gen: 78 y.o. female in no distress Pulm: Nonlabored breathing room air. Clear, distant. CV: Regular rate and rhythm. No murmur, rub, or gallop. Improved JVD, 2+ pitting dependent edema. GI: Abdomen soft, non-tender, non-distended, with normoactive bowel sounds.  Ext: Warm, no deformities. RUE edema  improved, non tender no erythema. Skin: Macular pinpoint erythema on right forearm, no papules or vesicles. Neuro: Alert and oriented. No focal neurological deficits. Psych: Judgement and insight appear fair. Mood euthymic & affect congruent. Behavior is appropriate.    Data Reviewed: I have personally reviewed following labs and imaging studies  CBC: Recent Labs  Lab 09/07/18 2203 09/09/18 0645 09/10/18 0543 09/11/18 0541 09/13/18 0648  WBC 14.0* 14.3* 16.7* 17.4* 12.3*  HGB 11.3* 10.9* 11.6* 11.4* 11.2*  HCT 37.2 36.0 37.8 37.6 37.3  MCV 93.5 94.2 93.1 93.5 94.4  PLT 317 293 315 329 932   Basic Metabolic Panel: Recent Labs  Lab 09/08/18 0652 09/09/18 0645 09/10/18 0543 09/11/18 0541 09/12/18 0707 09/13/18 0648  NA 145 144 145 143 144 144  K 3.0* 3.0* 3.9 4.6 3.6 5.1  CL 105 103 106 105 104 105  CO2 32 31 31 30 29 29   GLUCOSE 131* 167* 152* 161* 134* 142*  BUN 23 22 29* 33* 36* 39*  CREATININE 0.94 0.91 1.15* 1.01* 1.02* 1.01*  CALCIUM 8.1* 8.0* 8.1* 8.2* 8.2* 8.1*  MG 2.0  --  2.1  --  2.0  --   PHOS 2.8  --   --   --   --   --    GFR: Estimated Creatinine Clearance: 60.7 mL/min (A) (by C-G formula based on SCr of 1.01  mg/dL (H)). Liver Function Tests: Recent Labs  Lab 09/07/18 2203 09/08/18 0652 09/12/18 0707  AST 59* 32 43*  ALT 45* 47* 47*  ALKPHOS 254* 239* 238*  BILITOT 2.2* 1.2 1.1  PROT 5.6* 5.5* 5.6*  ALBUMIN 2.3* 2.3* 2.1*   Coagulation Profile: Recent Labs  Lab 09/08/18 0652  INR 1.04   Cardiac Enzymes: Recent Labs  Lab 09/07/18 2308  CKTOTAL 86   Urine analysis:    Component Value Date/Time   COLORURINE YELLOW 09/08/2018 [place] 09/08/2018 0447   LABSPEC 1.008 09/08/2018 [place] 7.0 09/08/2018 [place] 09/08/2018 [place] NEGATIVE 09/08/2018 [place] 09/08/2018 [place] 09/08/2018 0447   PROTEINUR NEGATIVE 09/08/2018 0447   NITRITE NEGATIVE  09/08/2018 0447   LEUKOCYTESUR NEGATIVE 09/08/2018 0447   Radiology Studies: [place] Upper Extremity Venous Duplex  Result Date: 09/12/2018 UPPER VENOUS STUDY  Indications: Swelling Performing Technologist: Oliver Hum RVT  Examination Guidelines: A complete evaluation includes B-mode imaging, spectral Doppler, color Doppler, and power Doppler as needed of all accessible portions of each vessel. Bilateral testing is considered an integral part of a complete examination. Limited examinations for reoccurring indications may be performed as noted.  Right Findings: +----------+------------+----------+---------+-----------+-------+ RIGHT     CompressiblePropertiesPhasicitySpontaneousSummary +----------+------------+----------+---------+-----------+-------+ IJV           Full                 Yes       Yes            +----------+------------+----------+---------+-----------+-------+ Subclavian    Full                 Yes       Yes            +----------+------------+----------+---------+-----------+-------+ Axillary      Full                 Yes       Yes            +----------+------------+----------+---------+-----------+-------+ Brachial      Full                 Yes       Yes            +----------+------------+----------+---------+-----------+-------+ Radial        Full                                          +----------+------------+----------+---------+-----------+-------+ Ulnar         Full                                          +----------+------------+----------+---------+-----------+-------+ Cephalic      Full                                          +----------+------------+----------+---------+-----------+-------+ Basilic       Full                                          +----------+------------+----------+---------+-----------+-------+  Left Findings: +----------+------------+----------+---------+-----------+-------+ LEFT       CompressiblePropertiesPhasicitySpontaneousSummary +----------+------------+----------+---------+-----------+-------+ Subclavian    Full                 Yes       Yes            +----------+------------+----------+---------+-----------+-------+  Summary:  Right: No evidence of deep vein thrombosis in the upper extremity. No evidence of superficial vein thrombosis in the upper extremity.  Left: No evidence of thrombosis in the subclavian.  *See table(s) above for measurements and observations.  Diagnosing physician: Ruta Hinds MD Electronically signed by Ruta Hinds MD on 09/12/2018 at 3:30:20 PM.    Final     Scheduled Meds: . enoxaparin (LOVENOX) injection  40 mg Subcutaneous Daily  . feeding supplement (PRO-STAT SUGAR FREE 64)  30 mL Oral Daily  . furosemide  80 mg Intravenous BID  . hydrALAZINE  50 mg Oral Q8H  . multivitamin with minerals  1 tablet Oral Daily  . simethicone  80 mg Oral QID  . sodium chloride flush  3 mL Intravenous Q12H   Continuous Infusions: . sodium chloride Stopped (09/09/18 1241)     LOS: 5 days   Time spent: 35 minutes.  Patrecia Pour, MD [facility] [email or web address] Password [facility] 09/13/2018, 3:22 PM

## 2018-09-13 NOTE — Progress Notes (Signed)
Occupational Therapy Treatment Patient Details Name: Carrie Chaney MRN: [id number] DOB: Jun 18, 1941 Today's Date: 09/13/2018    History of present illness 78 yo female admitted with progressive weakness, falls, bil LE edema, anasarca, CHF. 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 Hx of Dm, CHF.    OT comments  Pt with good participation with BUE exercise. Decreased edema noted BUE  Follow Up Recommendations  SNF          Precautions / Restrictions Restrictions Weight Bearing Restrictions: No       Mobility Bed Mobility              NT pt declined    Transfers  NT - pt stated not today                        ADL either performed or assessed with clinical judgement   ADL Overall ADL's : Needs assistance/impaired     Grooming: Wash/dry face;Bed level;Set up                                 General ADL Comments: goal added for BUE     Vision Patient Visual Report: No change from baseline     Perception     Praxis      Cognition Arousal/Alertness: Awake/alert Behavior During Therapy: WFL for tasks assessed/performed Overall Cognitive Status: History of cognitive impairments - at baseline                                          Exercises General Exercises - Upper Extremity Shoulder Flexion: AROM;10 reps;Both;Supine Shoulder ABduction: AROM;Both;10 reps;Supine Shoulder ADduction: AROM;10 reps;Both;Supine Shoulder Horizontal ABduction: AROM;Supine;10 reps Elbow Flexion: AROM;Both;10 reps;Supine Elbow Extension: AROM;10 reps;Supine Wrist Flexion: AROM;10 reps;Both;Supine Wrist Extension: AROM;10 reps;Supine Digit Composite Flexion: AROM;Both;20 reps;Supine Composite Extension: AROM;10 reps           Pertinent Vitals/ Pain       Pain Score: 5  Pain Location: abdomen with activity         Frequency  Min 2X/week        Progress Toward Goals  OT Goals(current goals can now  be found in the care plan section)     ADL Goals Pt/caregiver will Perform Home Exercise Program: Both right and left upper extremity;With theraband;With written HEP provided  Plan Discharge plan remains appropriate          End of Session    OT Visit Diagnosis: Unsteadiness on feet (R26.81);Muscle weakness (generalized) (M62.81)   Activity Tolerance Patient tolerated treatment well   Patient Left in bed;with bed alarm set   Nurse Communication Mobility status;Precautions        Time: 1884-1660 OT Time Calculation (min): 24 min  Charges: OT General Charges $OT Visit: 1 Visit OT Treatments $Therapeutic Exercise: 8-22 mins  Kari Baars, [place] [phone] Office- [phone], Edwena Felty D 09/13/2018, 10:33 PM

## 2018-09-13 NOTE — Consult Note (Addendum)
[facility]  [phone] [phone]    MEDICAL ONCOLOGY - INITIAL CONSULATION  Referral MD: Dr. Vance Gather  Reason for Referral: Newly diagnosed adenocarcinoma, unknown primary  HPI: Carrie Chaney is a 78 y.o. female with a history of HTN, HLD who presented for 4 weeks of progressive lower extremity swelling which continued despite starting bumex per PCP. This has caused limited mobility. CXR showed cardiomegaly, pulmonary congestion, albumin 2.3 with mild LFT elevations. BNP 299, CK 86, WBC 14k, creatinine 0.9, BUN 26. She was admitted for anasarca, started on IV diuretic, and work up included RUQ U/S which demonstrated multiple masses in the liver.  This was followed by a CT of the chest, abdomen, pelvis, tumor markers including a CEA, AFP, and CA 19-9, and a biopsy of one of her liver lesions.  Of note, the patient reports that she has never had a colonoscopy or upper endoscopy.  She did had the Cologuard test performed within the past year.  This was normal.  She is up-to-date on her mammograms and has had no evidence of malignancy.  The CT scan was consistent with bulky metastatic disease in the liver and between the right liver and the upper pole the right kidney.  She was noted to have perigastric, porta hepatis, and omental metastatic disease.  There was a 4 cm soft tissue mass associated with sigmoid colon.  There was no obvious primary source for the diffuse metastatic disease on the CT scan.  There was no evidence of metastatic disease within the thorax.  AFP and CA 19.9 were normal.  CEA was mildly elevated at 10.1.  Biopsy resulted earlier today was consistent with adenocarcinoma with a nonspecific immunohistochemical profile.    The patient reports fatigue.  She still has ongoing swelling which has improved with diuresis.  Still having difficulty walking.  Denies headaches and visual changes.  Denies chest pain and shortness of breath.  Denies  nausea, vomiting, constipation, diarrhea.  Reports a decreased appetite but has not lost weight due to anasarca.  Denies bleeding.  We were asked to see the patient for her new diagnosis of adenocarcinoma to make further recommendations.   Past Medical History:  Diagnosis Date  . Diabetes mellitus without complication ([place])   . Hyperlipemia   :  Past Surgical History:  Procedure Laterality Date  . ABDOMINAL HYSTERECTOMY    :  Current Facility-Administered Medications  Medication Dose Route Frequency Provider Last Rate Last Dose  . 0.9 %  sodium chloride infusion  250 mL Intravenous PRN Etta Quill, DO   Stopped at 09/09/18 1241  . acetaminophen (TYLENOL) tablet 650 mg  650 mg Oral Q4H PRN Etta Quill, DO      . enoxaparin (LOVENOX) injection 40 mg  40 mg Subcutaneous Daily Allred, Darrell K, PA-C   40 mg at 09/13/18 0917  . feeding supplement (PRO-STAT SUGAR FREE 64) liquid 30 mL  30 mL Oral Daily Vance Gather B, MD   30 mL at 09/13/18 0913  . furosemide (LASIX) injection 80 mg  80 mg Intravenous BID Fay Records, MD   80 mg at 09/13/18 6606  . hydrALAZINE (APRESOLINE) tablet 50 mg  50 mg Oral Q8H Fay Records, MD   50 mg at 09/13/18 1436  . multivitamin with minerals tablet 1 tablet  1 tablet Oral Daily Patrecia Pour, MD   1 tablet at 09/13/18 [phone]  . ondansetron (ZOFRAN) injection 4 mg  4 mg Intravenous Q6H  PRN Etta Quill, DO      . oxyCODONE (Oxy IR/ROXICODONE) immediate release tablet 5 mg  5 mg Oral Q4H PRN Patrecia Pour, MD   5 mg at 09/11/18 0533  . simethicone (MYLICON) chewable tablet 80 mg  80 mg Oral QID Patrecia Pour, MD   80 mg at 09/13/18 1436  . sodium chloride flush (NS) 0.9 % injection 3 mL  3 mL Intravenous Q12H Jennette Kettle M, DO   3 mL at 09/13/18 1751  . sodium chloride flush (NS) 0.9 % injection 3 mL  3 mL Intravenous PRN Etta Quill, DO         Allergies  Allergen Reactions  . Tomato Other (See Comments)    Breaks out gums  :  Family  History  Problem Relation Age of Onset  . Hypertension Mother   . Diabetes Other   . Cancer Neg Hx   :   Social History   Socioeconomic History  . Marital status: Married    Spouse name: Not on file  . Number of children: Not on file  . Years of education: Not on file  . Highest education level: Not on file  Occupational History  . Not on file  Social Needs  . Financial resource strain: Not on file  . Food insecurity:    Worry: Not on file    Inability: Not on file  . Transportation needs:    Medical: Not on file    Non-medical: Not on file  Tobacco Use  . Smoking status: Former Smoker    Packs/day: 0.25    Last attempt to quit: 08/22/2018    Years since quitting: 0.0  . Smokeless tobacco: Never Used  . Tobacco comment: one a day  Substance and Sexual Activity  . Alcohol use: No  . Drug use: No  . Sexual activity: Not on file  Lifestyle  . Physical activity:    Days per week: Not on file    Minutes per session: Not on file  . Stress: Not on file  Relationships  . Social connections:    Talks on phone: Not on file    Gets together: Not on file    Attends religious service: Not on file    Active member of club or organization: Not on file    Attends meetings of clubs or organizations: Not on file    Relationship status: Not on file  . Intimate partner violence:    Fear of current or ex partner: Not on file    Emotionally abused: Not on file    Physically abused: Not on file    Forced sexual activity: Not on file  Other Topics Concern  . Not on file  Social History Narrative  . Not on file  :  Constitutional: negative except for anorexia and fatigue Eyes: negative Ears, nose, mouth, throat, and face: negative Respiratory: negative Cardiovascular: negative except for lower extremity edema Gastrointestinal: negative Genitourinary:negative Integument/breast: negative Hematologic/lymphatic: negative Musculoskeletal:negative except for muscle  weakness Neurological: negative Behavioral/Psych: negative Endocrine: negative Allergic/Immunologic: negative  Exam: Patient Vitals for the past 24 hrs:  BP Temp Pulse Resp SpO2 Weight  09/13/18 1342 (!) 170/50 98.9 F (37.2 C) 81 16 91 % -  09/13/18 0501 (!) 173/50 98.2 F (36.8 C) 77 20 91 % -  09/13/18 0500 - - - - - 250 lb 7.1 oz (113.6 kg)  09/12/18 2111 (!) 160/44 98.5 F (36.9 C) 77 16 97 % -  General:  well-nourished in no acute distress.  Eyes:  no scleral icterus.  ENT:  There were no oropharyngeal lesions.  Neck was without thyromegaly.  Lymphatics:  Negative cervical, supraclavicular or axillary adenopathy.  Respiratory: lungs were clear bilaterally without wheezing or crackles.  Cardiovascular:  Regular rate and rhythm, S1/S2, without murmur, rub or gallop.  2+ pitting edema in the bilateral lower extremities.  GI:  abdomen was soft, flat, nontender, nondistended, without organomegaly.  Muscoloskeletal:  no spinal tenderness of palpation of vertebral spine.  Skin exam was without echymosis, petichae.  Neuro exam was nonfocal. Patient was alerted and oriented.  Attention was good.   Language was appropriate.  Mood was normal without depression.  Speech was not pressured.  Thought content was not tangential.     Lab Results  Component Value Date   WBC 12.3 (H) 09/13/2018   HGB 11.2 (L) 09/13/2018   HCT 37.3 09/13/2018   PLT 346 09/13/2018   GLUCOSE 142 (H) 09/13/2018   ALT 47 (H) 09/12/2018   AST 43 (H) 09/12/2018   NA 144 09/13/2018   K 5.1 09/13/2018   CL 105 09/13/2018   CREATININE 1.01 (H) 09/13/2018   BUN 39 (H) 09/13/2018   CO2 29 09/13/2018    Dg Chest 2 View  Result Date: 09/07/2018 CLINICAL DATA:  Swelling of the left lower extremity for a month. Dyspnea. EXAM: CHEST - 2 VIEW COMPARISON:  None. FINDINGS: Mild cardiomegaly with aortic atherosclerosis. Central vascular congestion consistent with mild CHF is noted. Atelectasis is seen at the right lung base.  IMPRESSION: Cardiomegaly with aortic atherosclerosis. Central vascular congestion consistent with mild CHF. Electronically Signed   By: [name] M.D.   On: 09/07/2018 20:01   Ct Chest W Contrast  Result Date: 09/08/2018 CLINICAL DATA:  Liver masses on ultrasound earlier today.  Anasarca. EXAM: CT CHEST, ABDOMEN, AND PELVIS WITH CONTRAST TECHNIQUE: Multidetector CT imaging of the chest, abdomen and pelvis was performed following the standard protocol during bolus administration of intravenous contrast. CONTRAST:  171m OMNIPAQUE IOHEXOL 300 MG/ML SOLN, 31mOMNIPAQUE IOHEXOL 300 MG/ML SOLN COMPARISON:  Ultrasound exam 09/08/2018 FINDINGS: CT CHEST FINDINGS Cardiovascular: The heart is enlarged. Coronary artery calcification is evident. Atherosclerotic calcification is noted in the wall of the thoracic aorta. Mediastinum/Nodes: No mediastinal lymphadenopathy. There is no hilar lymphadenopathy. There is no axillary lymphadenopathy. The esophagus has normal imaging features. Lungs/Pleura: The central tracheobronchial airways are patent. Subsegmental atelectasis noted in the dependent lower lungs bilaterally. No suspicious pulmonary nodule or mass. No substantial pleural effusion. Musculoskeletal: No worrisome lytic or sclerotic osseous abnormality. CT ABDOMEN PELVIS FINDINGS Hepatobiliary: As seen on the recent ultrasound exam, there are multiple liver masses, bulky year in the right lobe and in the left. Index lesion in the medial right liver measures 6.9 x 5.0 cm on 54/2. Index lesion in the medial segment left liver measures 2.5 cm. A large exophytic lesion seen from the inferior right liver extends into Morison's pouch and generates substantial mass-effect on the right kidney. There is no evidence for gallstones, gallbladder wall thickening, or pericholecystic fluid. No intrahepatic or extrahepatic biliary dilation. Pancreas: No focal mass lesion. No dilatation of the main duct. No intraparenchymal cyst. No  peripancreatic edema. Spleen: No splenomegaly. No focal mass lesion. Adrenals/Urinary Tract: Right adrenal gland not visualized. Left adrenal gland unremarkable. 4.1 cm cyst noted in the upper pole left kidney. The mass in the inferior right liver extending down to the upper pole the right kidney does  generate mass-effect on the right kidney but in some regions, appears to extend into the upper pole parenchyma of the right kidney (see coronal image 96 of series 5). 7 clear whether this is an exophytic mass arising from the upper right kidney or potentially hepatic origin invading the upper pole the right kidney. No evidence for hydroureter. The urinary bladder appears normal for the degree of distention. Stomach/Bowel: Stomach is unremarkable. No gastric wall thickening. No evidence of outlet obstruction. Duodenum is normally positioned as is the ligament of Treitz. No small bowel wall thickening. No small bowel dilatation. The terminal ileum is normal. The appendix is not visualized, but there is no edema or inflammation in the region of the cecum. Right colon and transverse colon are unremarkable. Diverticular changes are noted in the left colon. 3.4 x 3.8 cm soft tissue mass is identified along the sigmoid colon (99/2). Vascular/Lymphatic: There is abdominal aortic atherosclerosis without aneurysm. IVC is displaced to the left by the inferior right hepatic disease. 1.6 cm short axis hepato duodenal ligament lymph node evident on 58/2. 1.9 cm lymph node in the porta hepatis visible on 62/2. 10 mm short axis aortocaval lymph node visible on 72/2. No pelvic sidewall lymphadenopathy. Reproductive: Uterus surgically absent.  There is no adnexal mass. Other: Small volume free fluid seen adjacent to the liver and spleen as well as in the cul-de-sac. 16 mm perigastric nodule noted on 67/2. Similar adjacent nodule measures up to 2.0 cm. Multiple omental nodules are evident including 2.0 x 1.4 cm left omental nodule on  88/2. Musculoskeletal: No worrisome lytic or sclerotic osseous abnormality. IMPRESSION: 1. 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 2. Bulky metastatic disease is identified between the right liver in the upper pole the right kidney. This appears to involve both the liver and the kidney and is unclear whether this is primarily from the liver invading the upper pole the right kidney or potentially a right upper pole renal neoplasm invading the liver. 3. Perigastric, porta hepatis, and omental metastatic disease. Borderline enlarged retroperitoneal lymph nodes also suggest metastatic involvement. 4. 4 cm soft tissue mass associated with the sigmoid colon. While this could certainly be a primary colonic neoplasm, it has a severe coal configuration and does not have typical colorectal cancer CT imaging appearance. Given the metastatic disease elsewhere in the abdomen/pelvis, this could also represent metastatic disease in the pelvis. 5. Given the findings immediately above, no obvious primary source for the diffuse metastatic disease is evident. If the lesion along the sigmoid colon ends up being colorectal cancer, than the other findings in the abdomen and pelvis are certainly consistent with metastatic disease. Right adrenal or right renal cancer with direct liver invasion along with liver metastases, peritoneal/omental disease and metastatic lymphadenopathy would also be a consideration. 6. No evidence for metastatic disease in the thorax. 7. Aortic Atherosclerois (ICD10-170.0) Electronically Signed   By: Misty Stanley M.D.   On: 09/08/2018 18:30   Ct Abdomen Pelvis W Contrast  Result Date: 09/08/2018 CLINICAL DATA:  Liver masses on ultrasound earlier today.  Anasarca. EXAM: CT CHEST, ABDOMEN, AND PELVIS WITH CONTRAST TECHNIQUE: Multidetector CT imaging of the chest, abdomen and pelvis was performed following the standard protocol during bolus  administration of intravenous contrast. CONTRAST:  128m OMNIPAQUE IOHEXOL 300 MG/ML SOLN, 342mOMNIPAQUE IOHEXOL 300 MG/ML SOLN COMPARISON:  Ultrasound exam 09/08/2018 FINDINGS: CT CHEST FINDINGS Cardiovascular: The heart is enlarged. Coronary artery calcification is evident. Atherosclerotic  calcification is noted in the wall of the thoracic aorta. Mediastinum/Nodes: No mediastinal lymphadenopathy. There is no hilar lymphadenopathy. There is no axillary lymphadenopathy. The esophagus has normal imaging features. Lungs/Pleura: The central tracheobronchial airways are patent. Subsegmental atelectasis noted in the dependent lower lungs bilaterally. No suspicious pulmonary nodule or mass. No substantial pleural effusion. Musculoskeletal: No worrisome lytic or sclerotic osseous abnormality. CT ABDOMEN PELVIS FINDINGS Hepatobiliary: As seen on the recent ultrasound exam, there are multiple liver masses, bulky year in the right lobe and in the left. Index lesion in the medial right liver measures 6.9 x 5.0 cm on 54/2. Index lesion in the medial segment left liver measures 2.5 cm. A large exophytic lesion seen from the inferior right liver extends into Morison's pouch and generates substantial mass-effect on the right kidney. There is no evidence for gallstones, gallbladder wall thickening, or pericholecystic fluid. No intrahepatic or extrahepatic biliary dilation. Pancreas: No focal mass lesion. No dilatation of the main duct. No intraparenchymal cyst. No peripancreatic edema. Spleen: No splenomegaly. No focal mass lesion. Adrenals/Urinary Tract: Right adrenal gland not visualized. Left adrenal gland unremarkable. 4.1 cm cyst noted in the upper pole left kidney. The mass in the inferior right liver extending down to the upper pole the right kidney does generate mass-effect on the right kidney but in some regions, appears to extend into the upper pole parenchyma of the right kidney (see coronal image 96 of series 5). 7  clear whether this is an exophytic mass arising from the upper right kidney or potentially hepatic origin invading the upper pole the right kidney. No evidence for hydroureter. The urinary bladder appears normal for the degree of distention. Stomach/Bowel: Stomach is unremarkable. No gastric wall thickening. No evidence of outlet obstruction. Duodenum is normally positioned as is the ligament of Treitz. No small bowel wall thickening. No small bowel dilatation. The terminal ileum is normal. The appendix is not visualized, but there is no edema or inflammation in the region of the cecum. Right colon and transverse colon are unremarkable. Diverticular changes are noted in the left colon. 3.4 x 3.8 cm soft tissue mass is identified along the sigmoid colon (99/2). Vascular/Lymphatic: There is abdominal aortic atherosclerosis without aneurysm. IVC is displaced to the left by the inferior right hepatic disease. 1.6 cm short axis hepato duodenal ligament lymph node evident on 58/2. 1.9 cm lymph node in the porta hepatis visible on 62/2. 10 mm short axis aortocaval lymph node visible on 72/2. No pelvic sidewall lymphadenopathy. Reproductive: Uterus surgically absent.  There is no adnexal mass. Other: Small volume free fluid seen adjacent to the liver and spleen as well as in the cul-de-sac. 16 mm perigastric nodule noted on 67/2. Similar adjacent nodule measures up to 2.0 cm. Multiple omental nodules are evident including 2.0 x 1.4 cm left omental nodule on 88/2. Musculoskeletal: No worrisome lytic or sclerotic osseous abnormality. IMPRESSION: 1. 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 2. Bulky metastatic disease is identified between the right liver in the upper pole the right kidney. This appears to involve both the liver and the kidney and is unclear whether this is primarily from the liver invading the upper pole the right kidney or potentially a right  upper pole renal neoplasm invading the liver. 3. Perigastric, porta hepatis, and omental metastatic disease. Borderline enlarged retroperitoneal lymph nodes also suggest metastatic involvement. 4. 4 cm soft tissue mass associated with the sigmoid colon. While this could certainly be  a primary colonic neoplasm, it has a severe coal configuration and does not have typical colorectal cancer CT imaging appearance. Given the metastatic disease elsewhere in the abdomen/pelvis, this could also represent metastatic disease in the pelvis. 5. Given the findings immediately above, no obvious primary source for the diffuse metastatic disease is evident. If the lesion along the sigmoid colon ends up being colorectal cancer, than the other findings in the abdomen and pelvis are certainly consistent with metastatic disease. Right adrenal or right renal cancer with direct liver invasion along with liver metastases, peritoneal/omental disease and metastatic lymphadenopathy would also be a consideration. 6. No evidence for metastatic disease in the thorax. 7. Aortic Atherosclerois (ICD10-170.0) Electronically Signed   By: Misty Stanley M.D.   On: 09/08/2018 18:30   US Biopsy (liver)  Result Date: 09/09/2018 INDICATION: LIVER MASS, SUSPECT COLON PRIMARY EXAM: ULTRASOUND LEFT LIVER MASS BIOPSY MEDICATIONS: 1% lidocaine local ANESTHESIA/SEDATION: Moderate (conscious) sedation was employed during this procedure. A total of Versed 1.0 mg and Fentanyl 50 mcg was administered intravenously. Moderate Sedation Time: 10 minutes. The patient's level of consciousness and vital signs were monitored continuously by radiology nursing throughout the procedure under my direct supervision. FLUOROSCOPY TIME:  Fluoroscopy Time: None. COMPLICATIONS: None immediate. PROCEDURE: Informed written consent was obtained from the patient after a thorough discussion of the procedural risks, benefits and alternatives. All questions were addressed. Maximal  Sterile Barrier Technique was utilized including caps, mask, sterile gowns, sterile gloves, sterile drape, hand hygiene and skin antiseptic. A timeout was performed prior to the initiation of the procedure. Previous imaging reviewed. Patient positioned supine. Preliminary ultrasound performed. A left lobe lesion is demonstrated in the epigastric region. Overlying skin marked. Under sterile conditions and local anesthesia, a 17 gauge 6.8 cm access needle was advanced from an anterior epigastric approach to the lesion. Needle position confirmed with ultrasound. 18 gauge core biopsies obtained under direct ultrasound. Images obtained for documentation. Samples were placed in formalin. Needle removed. Patient tolerated the biopsy well. No immediate complication IMPRESSION: Successful ultrasound left liver mass 18 gauge core biopsy Electronically Signed   By: Jerilynn Mages.  Shick M.D.   On: 09/09/2018 14:40   Vas [place] Upper Extremity Venous Duplex  Result Date: 09/12/2018 UPPER VENOUS STUDY  Indications: Swelling Performing Technologist: Oliver Hum RVT  Examination Guidelines: A complete evaluation includes B-mode imaging, spectral Doppler, color Doppler, and power Doppler as needed of all accessible portions of each vessel. Bilateral testing is considered an integral part of a complete examination. Limited examinations for reoccurring indications may be performed as noted.  Right Findings: +----------+------------+----------+---------+-----------+-------+ RIGHT     CompressiblePropertiesPhasicitySpontaneousSummary +----------+------------+----------+---------+-----------+-------+ IJV           Full                 Yes       Yes            +----------+------------+----------+---------+-----------+-------+ Subclavian    Full                 Yes       Yes            +----------+------------+----------+---------+-----------+-------+ Axillary      Full                 Yes       Yes             +----------+------------+----------+---------+-----------+-------+ Brachial      Full  Yes       Yes            +----------+------------+----------+---------+-----------+-------+ Radial        Full                                          +----------+------------+----------+---------+-----------+-------+ Ulnar         Full                                          +----------+------------+----------+---------+-----------+-------+ Cephalic      Full                                          +----------+------------+----------+---------+-----------+-------+ Basilic       Full                                          +----------+------------+----------+---------+-----------+-------+  Left Findings: +----------+------------+----------+---------+-----------+-------+ LEFT      CompressiblePropertiesPhasicitySpontaneousSummary +----------+------------+----------+---------+-----------+-------+ Subclavian    Full                 Yes       Yes            +----------+------------+----------+---------+-----------+-------+  Summary:  Right: No evidence of deep vein thrombosis in the upper extremity. No evidence of superficial vein thrombosis in the upper extremity.  Left: No evidence of thrombosis in the subclavian.  *See table(s) above for measurements and observations.  Diagnosing physician: Ruta Hinds MD Electronically signed by Ruta Hinds MD on 09/12/2018 at 3:30:20 PM.    Final    US Abdomen Limited Ruq  Result Date: 09/08/2018 CLINICAL DATA:  78 year old female with abnormal LFTs.  Anasarca. EXAM: ULTRASOUND ABDOMEN LIMITED RIGHT UPPER QUADRANT COMPARISON:  None. FINDINGS: Gallbladder: No gallstones or wall thickening visualized. No sonographic Murphy sign noted by sonographer. Common bile duct: Diameter: 4 millimeters, normal. Liver: Abnormal, highly irregular and heterogeneous liver echogenicity suggesting multiple liver masses ranging from 2.5  centimeters to 10.5 centimeters diameter (images 18, 35, 43). The liver contour seems to remain relatively smooth (image 31). Portal vein is patent on color Doppler imaging with normal direction of blood flow towards the liver. Other findings: No ascites. IMPRESSION: Positive for multiple liver masses without obvious cirrhosis. Favor metastatic disease over primary liver tumor. CT chest abdomen and pelvis (oral and IV contrast preferred) recommended. Electronically Signed   By: Genevie Ann M.D.   On: 09/08/2018 02:20    Dg Chest 2 View  Result Date: 09/07/2018 CLINICAL DATA:  Swelling of the left lower extremity for a month. Dyspnea. EXAM: CHEST - 2 VIEW COMPARISON:  None. FINDINGS: Mild cardiomegaly with aortic atherosclerosis. Central vascular congestion consistent with mild CHF is noted. Atelectasis is seen at the right lung base. IMPRESSION: Cardiomegaly with aortic atherosclerosis. Central vascular congestion consistent with mild CHF. Electronically Signed   By: [name] M.D.   On: 09/07/2018 20:01   Ct Chest W Contrast  Result Date: 09/08/2018 CLINICAL DATA:  Liver masses on ultrasound earlier today.  Anasarca. EXAM: CT CHEST, ABDOMEN, AND PELVIS WITH CONTRAST TECHNIQUE: Multidetector CT  imaging of the chest, abdomen and pelvis was performed following the standard protocol during bolus administration of intravenous contrast. CONTRAST:  147m OMNIPAQUE IOHEXOL 300 MG/ML SOLN, 329mOMNIPAQUE IOHEXOL 300 MG/ML SOLN COMPARISON:  Ultrasound exam 09/08/2018 FINDINGS: CT CHEST FINDINGS Cardiovascular: The heart is enlarged. Coronary artery calcification is evident. Atherosclerotic calcification is noted in the wall of the thoracic aorta. Mediastinum/Nodes: No mediastinal lymphadenopathy. There is no hilar lymphadenopathy. There is no axillary lymphadenopathy. The esophagus has normal imaging features. Lungs/Pleura: The central tracheobronchial airways are patent. Subsegmental atelectasis noted in the  dependent lower lungs bilaterally. No suspicious pulmonary nodule or mass. No substantial pleural effusion. Musculoskeletal: No worrisome lytic or sclerotic osseous abnormality. CT ABDOMEN PELVIS FINDINGS Hepatobiliary: As seen on the recent ultrasound exam, there are multiple liver masses, bulky year in the right lobe and in the left. Index lesion in the medial right liver measures 6.9 x 5.0 cm on 54/2. Index lesion in the medial segment left liver measures 2.5 cm. A large exophytic lesion seen from the inferior right liver extends into Morison's pouch and generates substantial mass-effect on the right kidney. There is no evidence for gallstones, gallbladder wall thickening, or pericholecystic fluid. No intrahepatic or extrahepatic biliary dilation. Pancreas: No focal mass lesion. No dilatation of the main duct. No intraparenchymal cyst. No peripancreatic edema. Spleen: No splenomegaly. No focal mass lesion. Adrenals/Urinary Tract: Right adrenal gland not visualized. Left adrenal gland unremarkable. 4.1 cm cyst noted in the upper pole left kidney. The mass in the inferior right liver extending down to the upper pole the right kidney does generate mass-effect on the right kidney but in some regions, appears to extend into the upper pole parenchyma of the right kidney (see coronal image 96 of series 5). 7 clear whether this is an exophytic mass arising from the upper right kidney or potentially hepatic origin invading the upper pole the right kidney. No evidence for hydroureter. The urinary bladder appears normal for the degree of distention. Stomach/Bowel: Stomach is unremarkable. No gastric wall thickening. No evidence of outlet obstruction. Duodenum is normally positioned as is the ligament of Treitz. No small bowel wall thickening. No small bowel dilatation. The terminal ileum is normal. The appendix is not visualized, but there is no edema or inflammation in the region of the cecum. Right colon and transverse  colon are unremarkable. Diverticular changes are noted in the left colon. 3.4 x 3.8 cm soft tissue mass is identified along the sigmoid colon (99/2). Vascular/Lymphatic: There is abdominal aortic atherosclerosis without aneurysm. IVC is displaced to the left by the inferior right hepatic disease. 1.6 cm short axis hepato duodenal ligament lymph node evident on 58/2. 1.9 cm lymph node in the porta hepatis visible on 62/2. 10 mm short axis aortocaval lymph node visible on 72/2. No pelvic sidewall lymphadenopathy. Reproductive: Uterus surgically absent.  There is no adnexal mass. Other: Small volume free fluid seen adjacent to the liver and spleen as well as in the cul-de-sac. 16 mm perigastric nodule noted on 67/2. Similar adjacent nodule measures up to 2.0 cm. Multiple omental nodules are evident including 2.0 x 1.4 cm left omental nodule on 88/2. Musculoskeletal: No worrisome lytic or sclerotic osseous abnormality. IMPRESSION: 1. 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 2. Bulky metastatic disease is identified between the right liver in the upper pole the right kidney. This appears to involve both the liver and the kidney and is unclear whether this is primarily from  the liver invading the upper pole the right kidney or potentially a right upper pole renal neoplasm invading the liver. 3. Perigastric, porta hepatis, and omental metastatic disease. Borderline enlarged retroperitoneal lymph nodes also suggest metastatic involvement. 4. 4 cm soft tissue mass associated with the sigmoid colon. While this could certainly be a primary colonic neoplasm, it has a severe coal configuration and does not have typical colorectal cancer CT imaging appearance. Given the metastatic disease elsewhere in the abdomen/pelvis, this could also represent metastatic disease in the pelvis. 5. Given the findings immediately above, no obvious primary source for the diffuse  metastatic disease is evident. If the lesion along the sigmoid colon ends up being colorectal cancer, than the other findings in the abdomen and pelvis are certainly consistent with metastatic disease. Right adrenal or right renal cancer with direct liver invasion along with liver metastases, peritoneal/omental disease and metastatic lymphadenopathy would also be a consideration. 6. No evidence for metastatic disease in the thorax. 7. Aortic Atherosclerois (ICD10-170.0) Electronically Signed   By: Misty Stanley M.D.   On: 09/08/2018 18:30   Ct Abdomen Pelvis W Contrast  Result Date: 09/08/2018 CLINICAL DATA:  Liver masses on ultrasound earlier today.  Anasarca. EXAM: CT CHEST, ABDOMEN, AND PELVIS WITH CONTRAST TECHNIQUE: Multidetector CT imaging of the chest, abdomen and pelvis was performed following the standard protocol during bolus administration of intravenous contrast. CONTRAST:  119m OMNIPAQUE IOHEXOL 300 MG/ML SOLN, 328mOMNIPAQUE IOHEXOL 300 MG/ML SOLN COMPARISON:  Ultrasound exam 09/08/2018 FINDINGS: CT CHEST FINDINGS Cardiovascular: The heart is enlarged. Coronary artery calcification is evident. Atherosclerotic calcification is noted in the wall of the thoracic aorta. Mediastinum/Nodes: No mediastinal lymphadenopathy. There is no hilar lymphadenopathy. There is no axillary lymphadenopathy. The esophagus has normal imaging features. Lungs/Pleura: The central tracheobronchial airways are patent. Subsegmental atelectasis noted in the dependent lower lungs bilaterally. No suspicious pulmonary nodule or mass. No substantial pleural effusion. Musculoskeletal: No worrisome lytic or sclerotic osseous abnormality. CT ABDOMEN PELVIS FINDINGS Hepatobiliary: As seen on the recent ultrasound exam, there are multiple liver masses, bulky year in the right lobe and in the left. Index lesion in the medial right liver measures 6.9 x 5.0 cm on 54/2. Index lesion in the medial segment left liver measures 2.5 cm. A  large exophytic lesion seen from the inferior right liver extends into Morison's pouch and generates substantial mass-effect on the right kidney. There is no evidence for gallstones, gallbladder wall thickening, or pericholecystic fluid. No intrahepatic or extrahepatic biliary dilation. Pancreas: No focal mass lesion. No dilatation of the main duct. No intraparenchymal cyst. No peripancreatic edema. Spleen: No splenomegaly. No focal mass lesion. Adrenals/Urinary Tract: Right adrenal gland not visualized. Left adrenal gland unremarkable. 4.1 cm cyst noted in the upper pole left kidney. The mass in the inferior right liver extending down to the upper pole the right kidney does generate mass-effect on the right kidney but in some regions, appears to extend into the upper pole parenchyma of the right kidney (see coronal image 96 of series 5). 7 clear whether this is an exophytic mass arising from the upper right kidney or potentially hepatic origin invading the upper pole the right kidney. No evidence for hydroureter. The urinary bladder appears normal for the degree of distention. Stomach/Bowel: Stomach is unremarkable. No gastric wall thickening. No evidence of outlet obstruction. Duodenum is normally positioned as is the ligament of Treitz. No small bowel wall thickening. No small bowel dilatation. The terminal ileum is normal. The appendix is not  visualized, but there is no edema or inflammation in the region of the cecum. Right colon and transverse colon are unremarkable. Diverticular changes are noted in the left colon. 3.4 x 3.8 cm soft tissue mass is identified along the sigmoid colon (99/2). Vascular/Lymphatic: There is abdominal aortic atherosclerosis without aneurysm. IVC is displaced to the left by the inferior right hepatic disease. 1.6 cm short axis hepato duodenal ligament lymph node evident on 58/2. 1.9 cm lymph node in the porta hepatis visible on 62/2. 10 mm short axis aortocaval lymph node visible on  72/2. No pelvic sidewall lymphadenopathy. Reproductive: Uterus surgically absent.  There is no adnexal mass. Other: Small volume free fluid seen adjacent to the liver and spleen as well as in the cul-de-sac. 16 mm perigastric nodule noted on 67/2. Similar adjacent nodule measures up to 2.0 cm. Multiple omental nodules are evident including 2.0 x 1.4 cm left omental nodule on 88/2. Musculoskeletal: No worrisome lytic or sclerotic osseous abnormality. IMPRESSION: 1. 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 2. Bulky metastatic disease is identified between the right liver in the upper pole the right kidney. This appears to involve both the liver and the kidney and is unclear whether this is primarily from the liver invading the upper pole the right kidney or potentially a right upper pole renal neoplasm invading the liver. 3. Perigastric, porta hepatis, and omental metastatic disease. Borderline enlarged retroperitoneal lymph nodes also suggest metastatic involvement. 4. 4 cm soft tissue mass associated with the sigmoid colon. While this could certainly be a primary colonic neoplasm, it has a severe coal configuration and does not have typical colorectal cancer CT imaging appearance. Given the metastatic disease elsewhere in the abdomen/pelvis, this could also represent metastatic disease in the pelvis. 5. Given the findings immediately above, no obvious primary source for the diffuse metastatic disease is evident. If the lesion along the sigmoid colon ends up being colorectal cancer, than the other findings in the abdomen and pelvis are certainly consistent with metastatic disease. Right adrenal or right renal cancer with direct liver invasion along with liver metastases, peritoneal/omental disease and metastatic lymphadenopathy would also be a consideration. 6. No evidence for metastatic disease in the thorax. 7. Aortic Atherosclerois (ICD10-170.0)  Electronically Signed   By: Misty Stanley M.D.   On: 09/08/2018 18:30   US Biopsy (liver)  Result Date: 09/09/2018 INDICATION: LIVER MASS, SUSPECT COLON PRIMARY EXAM: ULTRASOUND LEFT LIVER MASS BIOPSY MEDICATIONS: 1% lidocaine local ANESTHESIA/SEDATION: Moderate (conscious) sedation was employed during this procedure. A total of Versed 1.0 mg and Fentanyl 50 mcg was administered intravenously. Moderate Sedation Time: 10 minutes. The patient's level of consciousness and vital signs were monitored continuously by radiology nursing throughout the procedure under my direct supervision. FLUOROSCOPY TIME:  Fluoroscopy Time: None. COMPLICATIONS: None immediate. PROCEDURE: Informed written consent was obtained from the patient after a thorough discussion of the procedural risks, benefits and alternatives. All questions were addressed. Maximal Sterile Barrier Technique was utilized including caps, mask, sterile gowns, sterile gloves, sterile drape, hand hygiene and skin antiseptic. A timeout was performed prior to the initiation of the procedure. Previous imaging reviewed. Patient positioned supine. Preliminary ultrasound performed. A left lobe lesion is demonstrated in the epigastric region. Overlying skin marked. Under sterile conditions and local anesthesia, a 17 gauge 6.8 cm access needle was advanced from an anterior epigastric approach to the lesion. Needle position confirmed with ultrasound. 18 gauge core biopsies obtained under direct ultrasound.  Images obtained for documentation. Samples were placed in formalin. Needle removed. Patient tolerated the biopsy well. No immediate complication IMPRESSION: Successful ultrasound left liver mass 18 gauge core biopsy Electronically Signed   By: Jerilynn Mages.  Shick M.D.   On: 09/09/2018 14:40   Vas [place] Upper Extremity Venous Duplex  Result Date: 09/12/2018 UPPER VENOUS STUDY  Indications: Swelling Performing Technologist: Oliver Hum RVT  Examination Guidelines: A complete  evaluation includes B-mode imaging, spectral Doppler, color Doppler, and power Doppler as needed of all accessible portions of each vessel. Bilateral testing is considered an integral part of a complete examination. Limited examinations for reoccurring indications may be performed as noted.  Right Findings: +----------+------------+----------+---------+-----------+-------+ RIGHT     CompressiblePropertiesPhasicitySpontaneousSummary +----------+------------+----------+---------+-----------+-------+ IJV           Full                 Yes       Yes            +----------+------------+----------+---------+-----------+-------+ Subclavian    Full                 Yes       Yes            +----------+------------+----------+---------+-----------+-------+ Axillary      Full                 Yes       Yes            +----------+------------+----------+---------+-----------+-------+ Brachial      Full                 Yes       Yes            +----------+------------+----------+---------+-----------+-------+ Radial        Full                                          +----------+------------+----------+---------+-----------+-------+ Ulnar         Full                                          +----------+------------+----------+---------+-----------+-------+ Cephalic      Full                                          +----------+------------+----------+---------+-----------+-------+ Basilic       Full                                          +----------+------------+----------+---------+-----------+-------+  Left Findings: +----------+------------+----------+---------+-----------+-------+ LEFT      CompressiblePropertiesPhasicitySpontaneousSummary +----------+------------+----------+---------+-----------+-------+ Subclavian    Full                 Yes       Yes            +----------+------------+----------+---------+-----------+-------+  Summary:   Right: No evidence of deep vein thrombosis in the upper extremity. No evidence of superficial vein thrombosis in the upper extremity.  Left: No evidence of thrombosis in the subclavian.  *See table(s) above for measurements and observations.  Diagnosing physician: Juanda Crumble  Fields MD Electronically signed by Ruta Hinds MD on 09/12/2018 at 3:30:20 PM.    Final    US Abdomen Limited Ruq  Result Date: 09/08/2018 CLINICAL DATA:  78 year old female with abnormal LFTs.  Anasarca. EXAM: ULTRASOUND ABDOMEN LIMITED RIGHT UPPER QUADRANT COMPARISON:  None. FINDINGS: Gallbladder: No gallstones or wall thickening visualized. No sonographic Murphy sign noted by sonographer. Common bile duct: Diameter: 4 millimeters, normal. Liver: Abnormal, highly irregular and heterogeneous liver echogenicity suggesting multiple liver masses ranging from 2.5 centimeters to 10.5 centimeters diameter (images 18, 35, 43). The liver contour seems to remain relatively smooth (image 31). Portal vein is patent on color Doppler imaging with normal direction of blood flow towards the liver. Other findings: No ascites. IMPRESSION: Positive for multiple liver masses without obvious cirrhosis. Favor metastatic disease over primary liver tumor. CT chest abdomen and pelvis (oral and IV contrast preferred) recommended. Electronically Signed   By: Genevie Ann M.D.   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 - SEE COMMENT. Microscopic Comment There is focal staining with cytokeratin 7. CDX-2, cytokeratin 20, estrogen receptor, napsin-A, PAx-8, TTF-1, and WT-1 are negative. This immunohistochemical profile is non-specific. The negative staining for cytokeratin 20 and CDX-2 argues against a colorectal primary. Additional studies can be performed upon clinician request. Enid Cutter MD Pathologist, Electronic Signature (Case signed 09/13/2018)  Assessment and Plan:   This is a 78 year old female with  1.  Newly diagnosed adenocarcinoma, unknown primary. Immunohistochemical staining is against colorectal cancer as the primary.  Consideration could be given to cholangiocarcinoma.  She does not have a gastroenterologist.  May consider getting a GI consult for possible sigmoidoscopy.  Will discuss at tumor conference.  I have discussed the CT scan findings and biopsy results with the patient and her sister today.  We discussed that additional information is needed to confirm the diagnosis before we can discuss treatment.  We will follow this patient closely with you.  2.  Anasarca: Improving with diuresis.  Anasarca is likely related to her hypoalbuminemia and cancer diagnosis.  Thank you for this referral.   Carrie Bussing, DNP, AGPCNP-BC, AOCNP   Addendum  I have seen the patient, examined her. I agree with the assessment and and plan and have edited the notes.   78 yo AAF, with PMH of hypertension and hyperlipidemia and was progressive lower extremity edema and fatigue.  She was admitted for anasarca.  Work-up showed a large right lobe liver mass with multiple satellite lesions, probable peritoneal metastasis and nodal metastasis.  Biopsy showed adenocarcinoma, CK7(+), other markers (-).  I have reviewed her biopsy results, and the CT scan findings with patient and HER-2 sisters in details.  Unfortunately her cancer is not curable at this stage, and the primary tumor could be cholangiocarcinoma, or other upper GI primary.  The scan showed a 4 cm mass in the sigmoid colon.  I recommend GI work-up with colonoscopy and EGD. Will contact on call [facility] to see pt tomorrow. Pt is agreeable with the plan. We discussed the possible treatment options, including chemo and biological agents. I will order FO on her biopsy. Due to her advanced age, limited social support and poor PS, she may not be a good candidate for intensive chemotherapy.  I plan to see her back in a  few weeks in my clinic to finalize her treatment plan.  I will present her case in our GI tumor board next week.  Carrie Chaney  09/13/2018

## 2018-09-14 DIAGNOSIS — C799 Secondary malignant neoplasm of unspecified site: Secondary | ICD-10-CM

## 2018-09-14 LAB — SURGICAL PCR SCREEN
MRSA, PCR: NEGATIVE
Staphylococcus aureus: NEGATIVE

## 2018-09-14 LAB — BASIC METABOLIC PANEL
Anion gap: 11 (ref 5–15)
BUN: 43 mg/dL — ABNORMAL HIGH (ref 8–23)
CALCIUM: 8.1 mg/dL — AB (ref 8.9–10.3)
CO2: 28 mmol/L (ref 22–32)
CREATININE: 1.02 mg/dL — AB (ref 0.44–1.00)
Chloride: 107 mmol/L (ref 98–111)
GFR calc Af Amer: >60 mL/min (ref >60)
GFR calc non Af Amer: 53 mL/min — ABNORMAL LOW (ref >60)
Glucose, Bld: 162 mg/dL — ABNORMAL HIGH (ref 70–99)
Potassium: 2.8 mmol/L — ABNORMAL LOW (ref 3.5–5.1)
Sodium: 146 mmol/L — ABNORMAL HIGH (ref 135–145)

## 2018-09-14 MED ORDER — POTASSIUM CHLORIDE CRYS ER 20 MEQ PO TBCR
40.0000 meq | EXTENDED_RELEASE_TABLET | Freq: Two times a day (BID) | ORAL | Status: DC
  Administered 2018-09-14 – 2018-09-20 (×12): 40 meq via ORAL
  Filled 2018-09-14 (×12): qty 2

## 2018-09-14 MED ORDER — AMLODIPINE BESYLATE 5 MG PO TABS
5.0000 mg | ORAL_TABLET | Freq: Every day | ORAL | Status: DC
  Administered 2018-09-15 – 2018-09-20 (×6): 5 mg via ORAL
  Filled 2018-09-14 (×6): qty 1

## 2018-09-14 MED ORDER — SODIUM CHLORIDE 0.9 % IV SOLN
INTRAVENOUS | Status: DC
  Administered 2018-09-14: 17:00:00 via INTRAVENOUS

## 2018-09-14 NOTE — Progress Notes (Signed)
Call placed to [facility] for consult. Patient will be see later today.

## 2018-09-14 NOTE — Plan of Care (Signed)
  Problem: Activity: Goal: Risk for activity intolerance will decrease Outcome: Progressing   Problem: Nutrition: Goal: Adequate nutrition will be maintained 09/14/2018 1759 by Dorene Sorrow, RN Outcome: Progressing 09/14/2018 0807 by Dorene Sorrow, RN Outcome: Progressing   Problem: Elimination: Goal: Will not experience complications related to bowel motility 09/14/2018 1759 by Dorene Sorrow, RN Outcome: Progressing 09/14/2018 0807 by Dorene Sorrow, RN Outcome: Progressing

## 2018-09-14 NOTE — Consult Note (Signed)
Referring Provider:  Dr. Burr Medico Primary Care Physician:  Lucianne Lei, MD Primary Gastroenterologist: Althia Forts  Reason for Consultation: Evaluate for EGD and colonoscopy for adenocarcinoma of unknown primary.   HPI: Carrie Chaney is a 78 y.o. female with past medical history of diabetes initially admitted to the hospital on September 07, 2018 with worsening lower extremity edema and impaired mobility.  Initial work-up with ultrasound showed multiple liver lesion concerning for metastatic disease.  Follow-up CT abdomen pelvis 02/20  showed bulky metastatic disease as well as 4 cm soft tissue mass in the sigmoid colon.  Liver biopsy showed adenocarcinoma with extracellular mucin.  Staining pattern not consistent with colon primary.  Could be upper GI versus cholangiocarcinoma.  GI is consulted for further evaluation with EGD and colonoscopy.  Patient seen and examined at bedside.  Besides abdominal distention she is not complaining of any GI symptoms.  She is having regular bowel movements.  She is denying any blood in the stool or black stool.  She denies reflux, dysphagia and odynophagia.  Family history of colon or  esophageal cancer.  No previous colonoscopy.  She had negative Cologuard test in June 2019.    Past Medical History:  Diagnosis Date  . Diabetes mellitus without complication ([place])   . Hyperlipemia     Past Surgical History:  Procedure Laterality Date  . ABDOMINAL HYSTERECTOMY     She is unsure if her ovaries were removed.     Prior to Admission medications   Medication Sig Start Date End Date Taking? Authorizing Provider  amLODipine (NORVASC) 10 MG tablet Take 10 mg by mouth daily.   Yes [provider]  atorvastatin (LIPITOR) 40 MG tablet Take 40 mg by mouth daily.   Yes [provider]  Azilsartan Medoxomil (EDARBI) 80 MG TABS Take 80 mg by mouth every morning.   Yes [provider]  bumetanide (BUMEX) 0.5 MG tablet Take 1 mg by mouth  daily.   Yes [provider]    Scheduled Meds: . [START ON 09/15/2018] amLODipine  5 mg Oral Daily  . enoxaparin (LOVENOX) injection  40 mg Subcutaneous Daily  . feeding supplement (PRO-STAT SUGAR FREE 64)  30 mL Oral Daily  . furosemide  80 mg Intravenous BID  . hydrALAZINE  50 mg Oral Q8H  . multivitamin with minerals  1 tablet Oral Daily  . potassium chloride  40 mEq Oral BID  . simethicone  80 mg Oral QID  . sodium chloride flush  3 mL Intravenous Q12H  . spironolactone  25 mg Oral Daily   Continuous Infusions: . sodium chloride Stopped (09/09/18 1241)   PRN Meds:.sodium chloride, acetaminophen, ondansetron (ZOFRAN) IV, oxyCODONE, sodium chloride flush  Allergies as of 09/07/2018 - Review Complete 09/07/2018  Allergen Reaction Noted  . Tomato Other (See Comments) 08/14/2016    Family History  Problem Relation Age of Onset  . Hypertension Mother   . Diabetes Other   . Cancer Neg Hx     Social History   Socioeconomic History  . Marital status: Married    Spouse name: Not on file  . Number of children: Not on file  . Years of education: Not on file  . Highest education level: Not on file  Occupational History  . Not on file  Social Needs  . Financial resource strain: Not on file  . Food insecurity:    Worry: Not on file    Inability: Not on file  . Transportation needs:    Medical:  Not on file    Non-medical: Not on file  Tobacco Use  . Smoking status: Former Smoker    Packs/day: 0.25    Last attempt to quit: 08/22/2018    Years since quitting: 0.0  . Smokeless tobacco: Never Used  . Tobacco comment: one a day  Substance and Sexual Activity  . Alcohol use: No  . Drug use: No  . Sexual activity: Not on file  Lifestyle  . Physical activity:    Days per week: Not on file    Minutes per session: Not on file  . Stress: Not on file  Relationships  . Social connections:    Talks on phone: Not on file    Gets together: Not on file    Attends  religious service: Not on file    Active member of club or organization: Not on file    Attends meetings of clubs or organizations: Not on file    Relationship status: Not on file  . Intimate partner violence:    Fear of current or ex partner: Not on file    Emotionally abused: Not on file    Physically abused: Not on file    Forced sexual activity: Not on file  Other Topics Concern  . Not on file  Social History Narrative  . Not on file    Review of Systems: Review of Systems  Constitutional: Negative for chills and fever.  HENT: Negative for hearing loss and tinnitus.   Eyes: Negative for blurred vision and double vision.  Respiratory: Negative for cough and hemoptysis.   Cardiovascular: Negative for chest pain and palpitations.  Gastrointestinal: Negative for abdominal pain, blood in stool, heartburn, melena, nausea and vomiting.  Genitourinary: Negative for dysuria and urgency.  Musculoskeletal: Positive for falls and myalgias.  Skin: Negative for itching and rash.  Neurological: Positive for weakness.  Endo/Heme/Allergies: Does not bruise/bleed easily.  Psychiatric/Behavioral: Negative for hallucinations and suicidal ideas.    Physical Exam: Vital signs: Vitals:   09/14/18 0539 09/14/18 1048  BP: (!) 166/48 (!) 156/44  Pulse: 77 77  Resp: 16   Temp: 98.3 F (36.8 C)   SpO2: 90% 92%   Last BM Date: 09/09/18 Physical Exam  Constitutional: She is oriented to person, place, and time. She appears well-developed and well-nourished. No distress.  HENT:  Head: Normocephalic and atraumatic.  Mouth/Throat: Oropharynx is clear and moist. No oropharyngeal exudate.  Eyes: EOM are normal. No scleral icterus.  Neck: Normal range of motion. Neck supple.  Cardiovascular: Normal rate and regular rhythm.  Murmur heard. Pulmonary/Chest: Effort normal and breath sounds normal. No respiratory distress.  Abdominal: Soft. Bowel sounds are normal. She exhibits distension. There is no  abdominal tenderness. There is no rebound.  Musculoskeletal: Normal range of motion.        General: Edema present.  Neurological: She is alert and oriented to person, place, and time.  Skin: Skin is warm. No erythema.  Psychiatric: She has a normal mood and affect. Judgment and thought content normal.  Vitals reviewed.  GI:  Lab Results: Recent Labs    09/13/18 0648  WBC 12.3*  HGB 11.2*  HCT 37.3  PLT 346   BMET Recent Labs    09/12/18 0707 09/13/18 0648 09/14/18 0649  NA 144 144 146*  K 3.6 5.1 2.8*  CL 104 105 107  CO2 29 29 28   GLUCOSE 134* 142* 162*  BUN 36* 39* 43*  CREATININE 1.02* 1.01* 1.02*  CALCIUM 8.2* 8.1* 8.1*  LFT Recent Labs    09/12/18 0707  PROT 5.6*  ALBUMIN 2.1*  AST 43*  ALT 47*  ALKPHOS 238*  BILITOT 1.1   PT/INR No results for input(s): LABPROT, INR in the last 72 hours.   Studies/Results:  Impression/Plan: -Metastatic adenocarcinoma with CT scan showing 4 cm sigmoid mass but staining pattern does not support colon cancer as  primary.  Possible primary as cholangiocarcinoma versus upper GI. -Anasarca. - Abnormal LFts.   Recommendations ------------------------ -Discussed with Dr. Burr Medico.  She will need EGD and colonoscopy for further evaluation.  Discussed with patient and patient's family.  They verbalized understanding. -Start full liquid diet today.  Colonoscopy prep tomorrow with tentative plan for EGD and colonoscopy on Friday. -GI will follow.  Risks (bleeding, infection, bowel perforation that could require surgery, sedation-related changes in cardiopulmonary systems), benefits (identification and possible treatment of source of symptoms, exclusion of certain causes of symptoms), and alternatives (watchful waiting, radiographic imaging studies, empiric medical treatment)  were explained to patient/family in detail and patient wishes to proceed.    LOS: 6 days   Otis Brace  MD, FACP 09/14/2018, 1:06 PM  Contact #   [phone]

## 2018-09-14 NOTE — Progress Notes (Signed)
Pt. Refused TED hose by stating that my whole body is swollen why my leg only.She continue to refuse regardless of educating the advantage.

## 2018-09-14 NOTE — Progress Notes (Signed)
PROGRESS NOTE  Carrie Chaney  PIR:[id number] DOB: 09-Apr-1941 DOA: 09/07/2018 PCP: Lucianne Lei, MD   Brief Narrative: Carrie Chaney is a 78 y.o. female with a history of HTN, HLD who presented for 4 weeks of progressive lower extremity swelling which continued despite starting bumex per PCP. This has caused limited mobility. CXR showed cardiomegaly, pulmonary congestion, albumin 2.3 with mild LFT elevations. BNP 299, CK 86, WBC 14k, creatinine 0.9, BUN 26. She was admitted for anasarca, started on IV diuretic, and work up included RUQ U/S which demonstrated multiple masses in the liver. Subsequent CT chest, abdomen, and pelvis demonstrated bulky metastasis in right liver and right kidney with omental and nodal metastases in the abdomen. Liver biopsy revealed adenocarcinoma of unknown primary. Oncology consulted, recommended EGD, colonoscopy for which GI was consulted and plans these procedures 2/28. Cardiology was consulted for assistance in diuresis in setting of diastolic dysfunction and multifactorial anasarca.  Assessment & Plan: Principal Problem:   Metastatic adenocarcinoma ([place]) Active Problems:   Hypertension   Anasarca   Elevated liver enzymes   Proximal limb muscle weakness   Abnormal LFTs   Weakness  Anasarca, acute on chronic HFpEF: Grossly fluid overloaded based on CXR pulm edema and exam with significant pitting edema. Reported dry weight ~220lbs per patient and currently 255lbs. Suspect hypoalbuminemia and diastolic dysfunction on echo are primary contributors. Would expect prolonged PT/INR if hepatic synthetic capacity was truly decreased. No proteinuria noted. TSH wnl. Suspect BNP of 299 is suppressed due to obesity. - I/O, daily weights not felt to be entirely accurate. Creatinine stable, BUN rising, and edema overall improving but remains grossly overloaded. Continue IV lasix 80mg  BID and newly added spironolactone.  - Monitor BMP, I/O, weights closely - Dietitian  consulted, supplement protein with prostat  Metastatic adenocarcinoma of unknown primary: Bulky metastatic disease in liver, essentially replaced right inferior liver on CT, extending to right kidney superior pole, also involving perigastric, portahepatis, and omentum. CEA 10.1, AFP normal at 2.3, CA 19-9 normal at 1. No adnexal or pancreatic mass. - Oncology consulted, recommending EGD and colonoscopy which are planned 2/28. Will discuss at GI tumor board.   Sigmoid colon mass: 4cm on CT, per radiology, not typical appearance of colorectal primary. Reports cologuard screening "clear" 1 year ago.  - CEA nonspecifically elevated at 10.1. Biopsy states cell staining is not consistent with colon primary. Colonoscopy planned 2/28.  SVT: Short runs on telemetry. Personally reviewed telemetry today showing mostly NSR, short SVT up to 170bpm briefly. - Defer to cardiology. Will keep electrolytes wnl and consider starting beta blocker. - Continue telemetry.  AKI: Mild.  - Continue to monitor with diuresis. Stable  Leukocytosis: Unclear etiology. No cough, dysuria, wounds. Improved without antimicrobial therapy and no fever.  - Low threshold to check cultures if develops a fever or localizing signs/symptoms.   Right upper extremity swelling: Improved with elevation, no SVT or DVT on U/S 2/24.  Abdominal pain: Multifactorial, likely related to degree of distention with bulky metastatic disease and anasarca. Also in consideration with loose stools, and leukocytosis, is colitis, though this did not appear on recent CT abd/pelvis and resolved spontaneously. - Can continue oxyIR (tolerated and effective for prior pains)  HTN:  - Holding norvasc for now. Could theoretically contribute to edema. Replaced with hydralazine with improvement, wide pulse pressures, increased dose 2/24. Will need additional agent. With SVT, feel BB would be appropriate. Cardiology following.   Hypokalemia: - Will plan on 2mEq  BID supplement with ongoing lasix.  Monitor closely with spiro.  Obesity: BMI 38, though significant water weight. Though still will qualify for obesity. - Weight loss is recommended.  Hyperlipidemia:  - Holding statin  Proximal muscle weakness: CPK nl, TSH nl.  - PT/OT to continue, suspect would need SNF when ready.  DVT prophylaxis: Lovenox Code Status: Full Family Communication: Sister at bedside Disposition Plan: SNF once closer to euvolemia. Continue IV lasix, planning EGD, colonoscopy 2/28.  Consultants:   IR  Cardiology  Oncology  GI  Procedures:   Echocardiogram 09/08/2018:  1. The left ventricle has hyperdynamic systolic function, with an ejection fraction of >65%. The cavity size was normal. There is moderately increased left ventricular wall thickness. Left ventricular diastolic Doppler parameters are consistent with  impaired relaxation.  2. The right ventricle has normal systolic function. The cavity was mildly enlarged. There is no increase in right ventricular wall thickness.  3. The mitral valve is normal in structure.  4. The tricuspid valve is normal in structure.  5. The aortic valve is normal in structure.  6. The pulmonic valve was normal in structure.  09/09/2018 Dr. Annamaria Boots: S/p [place] LEFT LIVER MASS CORE BX  Antimicrobials:  None   Subjective: Swelling in right arm and both legs is improved today from yesterday. TED hose on, feel these are also helping. Has not gotten up but feels too weak to do so. Willing to work with PT, especially at her sister's insistence. Unable to say whether she has dyspnea, no chest pain palpitations or other complaints. Abdominal pain has subsided.  Objective: Vitals:   09/14/18 0500 09/14/18 0539 09/14/18 1048 09/14/18 1442  BP:  (!) 166/48 (!) 156/44 (!) 145/48  Pulse:  77 77 88  Resp:  16    Temp:  98.3 F (36.8 C)  99.1 F (37.3 C)  TempSrc:      SpO2:  90% 92% 95%  Weight: 115.3 kg     Height:         Intake/Output Summary (Last 24 hours) at 09/14/2018 1454 Last data filed at 09/14/2018 0805 Gross per 24 hour  Intake 3 ml  Output 1100 ml  Net -1097 ml   Filed Weights   09/12/18 0411 09/13/18 0500 09/14/18 0500  Weight: 112.7 kg 113.6 kg 115.3 kg  Gen: 78 y.o. female in no distress Pulm: Nonlabored breathing room air. Clear. CV: Regular rate and rhythm. No murmur, rub, or gallop. + JVD, 2+ pitting dependent edema. GI: Abdomen soft, non-tender, non-distended, with normoactive bowel sounds.  Ext: Warm, no deformities. Essentially resolved RUE edema, nontender. Skin: No new rashes, lesions or ulcers on visualized skin. Neuro: Alert and oriented. No focal neurological deficits. Psych: Judgement and insight appear fair. Mood euthymic & affect congruent. Behavior is appropriate.    Data Reviewed: I have personally reviewed following labs and imaging studies  CBC: Recent Labs  Lab 09/07/18 2203 09/09/18 0645 09/10/18 0543 09/11/18 0541 09/13/18 0648  WBC 14.0* 14.3* 16.7* 17.4* 12.3*  HGB 11.3* 10.9* 11.6* 11.4* 11.2*  HCT 37.2 36.0 37.8 37.6 37.3  MCV 93.5 94.2 93.1 93.5 94.4  PLT 317 293 315 329 149   Basic Metabolic Panel: Recent Labs  Lab 09/08/18 0652  09/10/18 0543 09/11/18 0541 09/12/18 0707 09/13/18 0648 09/14/18 0649  NA 145   < > 145 143 144 144 146*  K 3.0*   < > 3.9 4.6 3.6 5.1 2.8*  CL 105   < > 106 105 104 105 107  CO2 32   < >  31 30 29 29 28   GLUCOSE 131*   < > 152* 161* 134* 142* 162*  BUN 23   < > 29* 33* 36* 39* 43*  CREATININE 0.94   < > 1.15* 1.01* 1.02* 1.01* 1.02*  CALCIUM 8.1*   < > 8.1* 8.2* 8.2* 8.1* 8.1*  MG 2.0  --  2.1  --  2.0  --   --   PHOS 2.8  --   --   --   --   --   --    < > = values in this interval not displayed.   GFR: Estimated Creatinine Clearance: 60.6 mL/min (A) (by C-G formula based on SCr of 1.02 mg/dL (H)). Liver Function Tests: Recent Labs  Lab 09/07/18 2203 09/08/18 0652 09/12/18 0707  AST 59* 32 43*  ALT  45* 47* 47*  ALKPHOS 254* 239* 238*  BILITOT 2.2* 1.2 1.1  PROT 5.6* 5.5* 5.6*  ALBUMIN 2.3* 2.3* 2.1*   Coagulation Profile: Recent Labs  Lab 09/08/18 0652  INR 1.04   Cardiac Enzymes: Recent Labs  Lab 09/07/18 2308  CKTOTAL 86   Urine analysis:    Component Value Date/Time   COLORURINE YELLOW 09/08/2018 [place] 09/08/2018 0447   LABSPEC 1.008 09/08/2018 [place] 7.0 09/08/2018 [place] 09/08/2018 [place] NEGATIVE 09/08/2018 [place] 09/08/2018 [place] 09/08/2018 0447   PROTEINUR NEGATIVE 09/08/2018 0447   NITRITE NEGATIVE 09/08/2018 0447   LEUKOCYTESUR NEGATIVE 09/08/2018 0447   Radiology Studies: [place] Upper Extremity Venous Duplex  Result Date: 09/12/2018 UPPER VENOUS STUDY  Indications: Swelling Performing Technologist: Oliver Hum RVT  Examination Guidelines: A complete evaluation includes B-mode imaging, spectral Doppler, color Doppler, and power Doppler as needed of all accessible portions of each vessel. Bilateral testing is considered an integral part of a complete examination. Limited examinations for reoccurring indications may be performed as noted.  Right Findings: +----------+------------+----------+---------+-----------+-------+ RIGHT     CompressiblePropertiesPhasicitySpontaneousSummary +----------+------------+----------+---------+-----------+-------+ IJV           Full                 Yes       Yes            +----------+------------+----------+---------+-----------+-------+ Subclavian    Full                 Yes       Yes            +----------+------------+----------+---------+-----------+-------+ Axillary      Full                 Yes       Yes            +----------+------------+----------+---------+-----------+-------+ Brachial      Full                 Yes       Yes             +----------+------------+----------+---------+-----------+-------+ Radial        Full                                          +----------+------------+----------+---------+-----------+-------+ Ulnar         Full                                          +----------+------------+----------+---------+-----------+-------+  Cephalic      Full                                          +----------+------------+----------+---------+-----------+-------+ Basilic       Full                                          +----------+------------+----------+---------+-----------+-------+  Left Findings: +----------+------------+----------+---------+-----------+-------+ LEFT      CompressiblePropertiesPhasicitySpontaneousSummary +----------+------------+----------+---------+-----------+-------+ Subclavian    Full                 Yes       Yes            +----------+------------+----------+---------+-----------+-------+  Summary:  Right: No evidence of deep vein thrombosis in the upper extremity. No evidence of superficial vein thrombosis in the upper extremity.  Left: No evidence of thrombosis in the subclavian.  *See table(s) above for measurements and observations.  Diagnosing physician: Ruta Hinds MD Electronically signed by Ruta Hinds MD on 09/12/2018 at 3:30:20 PM.    Final     Scheduled Meds: . Derrill Memo ON 09/15/2018] amLODipine  5 mg Oral Daily  . enoxaparin (LOVENOX) injection  40 mg Subcutaneous Daily  . feeding supplement (PRO-STAT SUGAR FREE 64)  30 mL Oral Daily  . furosemide  80 mg Intravenous BID  . hydrALAZINE  50 mg Oral Q8H  . multivitamin with minerals  1 tablet Oral Daily  . potassium chloride  40 mEq Oral BID  . simethicone  80 mg Oral QID  . sodium chloride flush  3 mL Intravenous Q12H  . spironolactone  25 mg Oral Daily   Continuous Infusions: . sodium chloride Stopped (09/09/18 1241)     LOS: 6 days   Time spent: 35 minutes.  Patrecia Pour,  MD [facility] [email or web address] Password [facility] 09/14/2018, 2:54 PM

## 2018-09-14 NOTE — Progress Notes (Addendum)
Progress Note  Patient Name: Carrie Chaney Date of Encounter: 09/14/2018  Primary Cardiologist: Dorris Carnes, MD   Subjective   Feeling ok. Denies any CP or SOB.   Inpatient Medications    Scheduled Meds: . amLODipine  2.5 mg Oral Daily  . enoxaparin (LOVENOX) injection  40 mg Subcutaneous Daily  . feeding supplement (PRO-STAT SUGAR FREE 64)  30 mL Oral Daily  . furosemide  80 mg Intravenous BID  . hydrALAZINE  50 mg Oral Q8H  . multivitamin with minerals  1 tablet Oral Daily  . potassium chloride  40 mEq Oral BID  . simethicone  80 mg Oral QID  . sodium chloride flush  3 mL Intravenous Q12H  . spironolactone  25 mg Oral Daily   Continuous Infusions: . sodium chloride Stopped (09/09/18 1241)   PRN Meds: sodium chloride, acetaminophen, ondansetron (ZOFRAN) IV, oxyCODONE, sodium chloride flush   Vital Signs    Vitals:   09/13/18 2011 09/14/18 0500 09/14/18 0539 09/14/18 1048  BP: (!) 162/47  (!) 166/48 (!) 156/44  Pulse: 84  77 77  Resp: 20  16   Temp: 98.5 F (36.9 C)  98.3 F (36.8 C)   TempSrc:      SpO2: 92%  90% 92%  Weight:  115.3 kg    Height:        Intake/Output Summary (Last 24 hours) at 09/14/2018 1158 Last data filed at 09/14/2018 0805 Gross per 24 hour  Intake 123 ml  Output 1500 ml  Net -1377 ml   Last 3 Weights 09/14/2018 09/13/2018 09/12/2018  Weight (lbs) 254 lb 3.1 oz 250 lb 7.1 oz 248 lb 7.3 oz  Weight (kg) 115.3 kg 113.6 kg 112.7 kg      Telemetry    NSR without any recurrent PSVT - Personally Reviewed  ECG    Sinus rhythm with short PR interval - Personally Reviewed  Physical Exam   GEN: No acute distress.   Neck: No JVD Cardiac: RRR, no murmurs, rubs, or gallops.  Respiratory: Clear to auscultation bilaterally. GI: Soft, nontender, non-distended  MS: No deformity. 3+ pitting edema Neuro:  Nonfocal  Psych: Normal affect   Labs    Chemistry Recent Labs  Lab 09/07/18 2203 09/08/18 0652  09/12/18 0707 09/13/18 0648  09/14/18 0649  NA 143 145   < > 144 144 146*  K 4.8 3.0*   < > 3.6 5.1 2.8*  CL 104 105   < > 104 105 107  CO2 30 32   < > 29 29 28   GLUCOSE 135* 131*   < > 134* 142* 162*  BUN 26* 23   < > 36* 39* 43*  CREATININE 0.96 0.94   < > 1.02* 1.01* 1.02*  CALCIUM 8.1* 8.1*   < > 8.2* 8.1* 8.1*  PROT 5.6* 5.5*  --  5.6*  --   --   ALBUMIN 2.3* 2.3*  --  2.1*  --   --   AST 59* 32  --  43*  --   --   ALT 45* 47*  --  47*  --   --   ALKPHOS 254* 239*  --  238*  --   --   BILITOT 2.2* 1.2  --  1.1  --   --   GFRNONAA 57* 58*   < > 53* 53* 53*  GFRAA >60 >60   < > >60 >60 >60  ANIONGAP 9 8   < > 11 10 11    < > =  values in this interval not displayed.     Hematology Recent Labs  Lab 09/10/18 0543 09/11/18 0541 09/13/18 0648  WBC 16.7* 17.4* 12.3*  RBC 4.06 4.02 3.95  HGB 11.6* 11.4* 11.2*  HCT 37.8 37.6 37.3  MCV 93.1 93.5 94.4  MCH 28.6 28.4 28.4  MCHC 30.7 30.3 30.0  RDW 17.7* 17.3* 17.6*  PLT 315 329 346    Cardiac EnzymesNo results for input(s): TROPONINI in the last 168 hours. No results for input(s): TROPIPOC in the last 168 hours.   BNP Recent Labs  Lab 09/07/18 2203  BNP 299.9*     DDimer No results for input(s): DDIMER in the last 168 hours.   Radiology    Vas [place] Upper Extremity Venous Duplex  Result Date: 09/12/2018 UPPER VENOUS STUDY  Indications: Swelling Performing Technologist: Oliver Hum RVT  Examination Guidelines: A complete evaluation includes B-mode imaging, spectral Doppler, color Doppler, and power Doppler as needed of all accessible portions of each vessel. Bilateral testing is considered an integral part of a complete examination. Limited examinations for reoccurring indications may be performed as noted.  Right Findings: +----------+------------+----------+---------+-----------+-------+ RIGHT     CompressiblePropertiesPhasicitySpontaneousSummary +----------+------------+----------+---------+-----------+-------+ IJV           Full                  Yes       Yes            +----------+------------+----------+---------+-----------+-------+ Subclavian    Full                 Yes       Yes            +----------+------------+----------+---------+-----------+-------+ Axillary      Full                 Yes       Yes            +----------+------------+----------+---------+-----------+-------+ Brachial      Full                 Yes       Yes            +----------+------------+----------+---------+-----------+-------+ Radial        Full                                          +----------+------------+----------+---------+-----------+-------+ Ulnar         Full                                          +----------+------------+----------+---------+-----------+-------+ Cephalic      Full                                          +----------+------------+----------+---------+-----------+-------+ Basilic       Full                                          +----------+------------+----------+---------+-----------+-------+  Left Findings: +----------+------------+----------+---------+-----------+-------+ LEFT      CompressiblePropertiesPhasicitySpontaneousSummary +----------+------------+----------+---------+-----------+-------+ Subclavian    Full  Yes       Yes            +----------+------------+----------+---------+-----------+-------+  Summary:  Right: No evidence of deep vein thrombosis in the upper extremity. No evidence of superficial vein thrombosis in the upper extremity.  Left: No evidence of thrombosis in the subclavian.  *See table(s) above for measurements and observations.  Diagnosing physician: Ruta Hinds MD Electronically signed by Ruta Hinds MD on 09/12/2018 at 3:30:20 PM.    Final     Cardiac Studies   Echo 09/08/2018 IMPRESSIONS    1. The left ventricle has hyperdynamic systolic function, with an ejection fraction of >65%. The cavity size was  normal. There is moderately increased left ventricular wall thickness. Left ventricular diastolic Doppler parameters are consistent with  impaired relaxation.  2. The right ventricle has normal systolic function. The cavity was mildly enlarged. There is no increase in right ventricular wall thickness.  3. The mitral valve is normal in structure.  4. The tricuspid valve is normal in structure.  5. The aortic valve is normal in structure.  6. The pulmonic valve was normal in structure.  Patient Profile     78 y.o. female with PMH of metastatic CA/liver lesions, HTN, HLD and low albumin presented with generalized edema. Undergoing diuresis.   Assessment & Plan    1. Generalized edema: improving. Continue IV lasix. Supplement with KCl. Net -8.6L. Upper extremity edema resolved. Still has 2-3+ pitting edema in bilateral lower extremity  2. HTN: BP remain high. Increase amlodipine to 5mg  daily.   3. HLD: on lipitor at home  5. Metastatic CA: unknown primary origin  6. 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 Hypoalbuminemia: on feeding supplement  8. Hypokalemia: K 2.8.   40 meq KCl BID started by primary team  9. Heart murmur: no significant valve issue noted on echo despite persistent murmur on exam.   For questions or updates, please contact [place] Please consult [email or web address] for contact info under   Signed, Almyra Deforest, [place]  09/14/2018, 11:58 AM     The patient was seen, examined and discussed with Almyra Deforest, PA-C and I agree with the above.   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  Primary tumor is unknown so far, this is associated with anasarca, that is most probably related to cancer and hypoalbuminemia.  She has normal  biventricular systolic function and grade 1 diastolic dysfunction that is age-appropriate. She was started on Lasix IV twice daily with -8.6 L since admission, her creatinine is stable, I would continue the same regimen of Lasix and spironolactone 25 mg daily, we will follow potassium closely, Her baseline weight in September 2019 was 225 pounds, she was admitted with 260 pounds, Weights inaccurate, but improved upper extremity edema, persistent LE edema. Increase amlodipine to 5 mg daily for blood pressure management.  Apply TED hoses.   Ena Dawley, MD 09/14/2018

## 2018-09-14 NOTE — Progress Notes (Signed)
Physical Therapy Treatment Patient Details Name: Carrie Chaney MRN: [id number] DOB: 06/01/1941 Today's Date: 09/14/2018    History of Present Illness 78 yo female admitted with progressive weakness, falls, bil LE edema, anasarca, CHF. 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 Hx of Dm, CHF.     PT Comments    Assisted OOB to [facility] as bed was soaked from peri wick.  General bed mobility comments: required physical assist for B LE and trunk management; use of bed pad to complete pivot and scooting.  Assisted to [facility].  General transfer comment: required increased assist to transfer from elevated bed to [facility] and then even more assist to stand from lower level BSC.  100% VC's on proper hand placement to to increase self lift.  General Gait Details: unable to attempt to to low transfer ability.  General Comments: pt quick to state "I can't" and requires MAX encouragement to increase self participation  Follow Up Recommendations  SNF     Equipment Recommendations       Recommendations for Other Services       Precautions / Restrictions Precautions Precautions: Fall Restrictions Weight Bearing Restrictions: No    Mobility  Bed Mobility Overal bed mobility: Needs Assistance Bed Mobility: Supine to Sit     Supine to sit: HOB elevated;Mod assist;+2 for physical assistance;Max assist     General bed mobility comments: required physical assist for B LE and trunk management; use of bed pad to complete pivot and scooting  Transfers Overall transfer level: Needs assistance Equipment used: Rolling walker (2 wheeled) Transfers: Sit to/from [employer] Transfers Sit to Stand: Max assist;+2 physical assistance;+2 safety/equipment Stand pivot transfers: Max assist;+2 physical assistance;+2 safety/equipment       General transfer comment: required increased assist to transfer from elevated bed to [facility] and then even more assist to stand from lower level BSC.  100%  VC's on proper hand placement to to increase self lift.    Ambulation/Gait             General Gait Details: unable to attempt to to low transfer ability.     Stairs             Wheelchair Mobility    Modified Rankin (Stroke Patients Only)       Balance                                            Cognition Arousal/Alertness: Awake/alert Behavior During Therapy: WFL for tasks assessed/performed Overall Cognitive Status: Within Functional Limits for tasks assessed                                 General Comments: pt quick to state "I can't" and requires MAX encouragement to increase self participation      Exercises      General Comments        Pertinent Vitals/Pain Pain Assessment: No/denies pain    Home Living                      Prior Function            PT Goals (current goals can now be found in the care plan section) Progress towards PT goals: Progressing toward goals    Frequency    Min 3X/week  PT Plan Current plan remains appropriate    Co-evaluation              AM-PAC PT "6 Clicks" Mobility   Outcome Measure  Help needed turning from your back to your side while in a flat bed without using bedrails?: A Lot Help needed moving from lying on your back to sitting on the side of a flat bed without using bedrails?: A Lot Help needed moving to and from a bed to a chair (including a wheelchair)?: A Lot Help needed standing up from a chair using your arms (e.g., wheelchair or bedside chair)?: A Lot Help needed to walk in hospital room?: Total Help needed climbing 3-5 steps with a railing? : Total 6 Click Score: 10    End of Session Equipment Utilized During Treatment: Gait belt Activity Tolerance: Patient limited by fatigue(pt feels very tired and drained) Patient left: in chair;with call bell/phone within reach;with chair alarm set;with nursing/sitter in room Nurse Communication:  Mobility status PT Visit Diagnosis: Muscle weakness (generalized) (M62.81);History of falling (Z91.81);Unsteadiness on feet (R26.81);Difficulty in walking, not elsewhere classified (R26.2);Pain     Time: 8016-5537 PT Time Calculation (min) (ACUTE ONLY): 30 min  Charges:  $Therapeutic Activity: 23-37 mins                     Rica Koyanagi  PTA Acute  Rehabilitation Services Pager      [phone] Office      [phone]

## 2018-09-14 NOTE — Plan of Care (Signed)
  Problem: Nutrition: Goal: Adequate nutrition will be maintained Outcome: Progressing   Problem: Elimination: Goal: Will not experience complications related to bowel motility Outcome: Progressing   Problem: Pain Managment: Goal: General experience of comfort will improve Outcome: Progressing   

## 2018-09-15 ENCOUNTER — Telehealth: Payer: Self-pay | Admitting: Hematology

## 2018-09-15 LAB — BASIC METABOLIC PANEL
Anion gap: 10 (ref 5–15)
BUN: 55 mg/dL — ABNORMAL HIGH (ref 8–23)
CO2: 30 mmol/L (ref 22–32)
Calcium: 8 mg/dL — ABNORMAL LOW (ref 8.9–10.3)
Chloride: 106 mmol/L (ref 98–111)
Creatinine, Ser: 1.3 mg/dL — ABNORMAL HIGH (ref 0.44–1.00)
GFR calc Af Amer: 46 mL/min — ABNORMAL LOW (ref >60)
GFR calc non Af Amer: 39 mL/min — ABNORMAL LOW (ref >60)
Glucose, Bld: 167 mg/dL — ABNORMAL HIGH (ref 70–99)
Potassium: 3.4 mmol/L — ABNORMAL LOW (ref 3.5–5.1)
Sodium: 146 mmol/L — ABNORMAL HIGH (ref 135–145)

## 2018-09-15 MED ORDER — PEG 3350-KCL-NA BICARB-NACL 420 G PO SOLR
4000.0000 mL | Freq: Once | ORAL | Status: AC
  Administered 2018-09-15: 4000 mL via ORAL

## 2018-09-15 MED ORDER — FUROSEMIDE 40 MG PO TABS
80.0000 mg | ORAL_TABLET | Freq: Two times a day (BID) | ORAL | Status: DC
  Administered 2018-09-15 – 2018-09-18 (×6): 80 mg via ORAL
  Filled 2018-09-15 (×6): qty 2

## 2018-09-15 NOTE — Telephone Encounter (Signed)
A hospital follow up appt has been scheduled for the pt to see Dr. Burr Medico on 3/17 at 1:15pm. Pt is currently in the hospital.

## 2018-09-15 NOTE — Progress Notes (Addendum)
Progress Note  Patient Name: Carrie Chaney Date of Encounter: 09/15/2018  Primary Cardiologist: Dorris Carnes, MD   Subjective   Patient resting comfortably in bed, almost flat. She denies chest pain/pressure/tightness or shortness of breath. She now has a foley cath and is just now receiving am lasix dose. She says that her swelling is much improved.   Inpatient Medications    Scheduled Meds: . amLODipine  5 mg Oral Daily  . enoxaparin (LOVENOX) injection  40 mg Subcutaneous Daily  . feeding supplement (PRO-STAT SUGAR FREE 64)  30 mL Oral Daily  . furosemide  80 mg Intravenous BID  . hydrALAZINE  50 mg Oral Q8H  . multivitamin with minerals  1 tablet Oral Daily  . polyethylene glycol-electrolytes  4,000 mL Oral Once  . potassium chloride  40 mEq Oral BID  . simethicone  80 mg Oral QID  . sodium chloride flush  3 mL Intravenous Q12H  . spironolactone  25 mg Oral Daily   Continuous Infusions: . sodium chloride Stopped (09/09/18 1241)  . sodium chloride 20 mL/hr at 09/14/18 1805   PRN Meds: sodium chloride, acetaminophen, ondansetron (ZOFRAN) IV, oxyCODONE, sodium chloride flush   Vital Signs    Vitals:   09/14/18 1048 09/14/18 1442 09/14/18 2058 09/15/18 0601  BP: (!) 156/44 (!) 145/48 (!) 141/47 (!) 142/47  Pulse: 77 88 80 73  Resp:   17 18  Temp:  99.1 F (37.3 C) 98.4 F (36.9 C) 97.9 F (36.6 C)  TempSrc:   Oral   SpO2: 92% 95% 93% 94%  Weight:      Height:        Intake/Output Summary (Last 24 hours) at 09/15/2018 1056 Last data filed at 09/15/2018 0700 Gross per 24 hour  Intake 345.02 ml  Output 970 ml  Net -624.98 ml   Last 3 Weights 09/14/2018 09/13/2018 09/12/2018  Weight (lbs) 254 lb 3.1 oz 250 lb 7.1 oz 248 lb 7.3 oz  Weight (kg) 115.3 kg 113.6 kg 112.7 kg      Telemetry    Sinus rhythm in the 70's with occ increase to 120's-130's with activity.  - Personally Reviewed  ECG    No new tracings - Personally Reviewed  Physical Exam   GEN:  No acute distress.   Neck: No JVD Cardiac: RRR, no murmurs, rubs, or gallops. Systolic murmur left chest Respiratory: Clear to auscultation bilaterally. GI: Soft, nontender, non-distended  MS: 1+ lower leg edema; No deformity. Neuro:  Nonfocal  Psych: Normal affect   Labs    Chemistry Recent Labs  Lab 09/12/18 0707 09/13/18 0648 09/14/18 0649 09/15/18 0558  NA 144 144 146* 146*  K 3.6 5.1 2.8* 3.4*  CL 104 105 107 106  CO2 29 29 28 30   GLUCOSE 134* 142* 162* 167*  BUN 36* 39* 43* 55*  CREATININE 1.02* 1.01* 1.02* 1.30*  CALCIUM 8.2* 8.1* 8.1* 8.0*  PROT 5.6*  --   --   --   ALBUMIN 2.1*  --   --   --   AST 43*  --   --   --   ALT 47*  --   --   --   ALKPHOS 238*  --   --   --   BILITOT 1.1  --   --   --   GFRNONAA 53* 53* 53* 39*  GFRAA >60 >60 >60 46*  ANIONGAP 11 10 11 10      Hematology Recent Labs  Lab 09/10/18 [phone]  09/11/18 0541 09/13/18 0648  WBC 16.7* 17.4* 12.3*  RBC 4.06 4.02 3.95  HGB 11.6* 11.4* 11.2*  HCT 37.8 37.6 37.3  MCV 93.1 93.5 94.4  MCH 28.6 28.4 28.4  MCHC 30.7 30.3 30.0  RDW 17.7* 17.3* 17.6*  PLT 315 329 346    Cardiac EnzymesNo results for input(s): TROPONINI in the last 168 hours. No results for input(s): TROPIPOC in the last 168 hours.   BNPNo results for input(s): BNP, PROBNP in the last 168 hours.   DDimer No results for input(s): DDIMER in the last 168 hours.   Radiology    No results found.  Cardiac Studies   Echo 09/08/2018 IMPRESSIONS  1. The left ventricle has hyperdynamic systolic function, with an ejection fraction of >65%. The cavity size was normal. There is moderately increased left ventricular wall thickness. Left ventricular diastolic Doppler parameters are consistent with  impaired relaxation. 2. The right ventricle has normal systolic function. The cavity was mildly enlarged. There is no increase in right ventricular wall thickness. 3. The mitral valve is normal in structure. 4. The tricuspid valve  is normal in structure. 5. The aortic valve is normal in structure. 6. The pulmonic valve was normal in structure.  Patient Profile     78 y.o. female with PMH of metastatic CA/liver lesions, HTN, HLD and low albumin presented with generalized edema. Undergoing diuresis.   Assessment & Plan    Generalized edema/anasarca -Probably related to metastatic cancer and hypoalbuminemia -Diuresing with lasix 80 mg IV BID. Spironolactone 25 mg added on 2/25. Pt has urinary retention with I&O cath of 970 ml early this am. Now has a foley cath. This is the only output documented for yesterday. She is net -9.2L fluid balance since admission.  -SCr but up to 1.3 today from 1.02 yesterday. May have been related to urinary retention +- diuresis. Watch closely.  -Edema is much improved, still has some lower extremity edema.  -Continue current diuresis.   Hypokalemia -K+ was 2.8 yesterday, on potassium supplementation now and K+ 3.4 today.   Metastatic adenocarcinoma of unknown primary -With anasarca probably related to cancer and hypoalbuminemia.  -Oncology is following and GI is Planning for EGD/colonoscopy tomorrow.  Hypertension -Diuresing with IV lasix. Also on hydralazine 50 mg TID. Amlodipine increased from 2.5 mg to 5 daily starting today.  -BP mildly elevated today at 142/47. Watch with adjustment in amlodipine.   Hyperlipidemia -On lipitor at home.   PSVT -resolved.   For questions or updates, please contact [place] Please consult [email or web address] for contact info under   Signed, Daune Perch, NP  09/15/2018, 10:56 AM    The patient was seen, examined and discussed with Daune Perch, NP-C and I agree with the above.   78 year old female with history of hypertension, hyperlipidemia, who was admitted with generalized edema in her abdomen, CT showed multiple liver masses, renal mass, and masses on her omentum, she was diagnosed with adenocarcinoma with extracellular mucin  from the biopsy of the liver metastasis. Primary tumor is unknown so far, this is associated with anasarca, that is most probably related to cancer and hypoalbuminemia. She has normal biventricular systolic function and grade 1 diastolic dysfunction that is age-appropriate. She was started on Lasix IV twice daily with over 10 L since admission, edema has improved significantly, her creatinine is now trending up, we will switch to Lasix 80 mg PO BID and continue  spironolactone 25 mg daily, replace potassium. Her baseline weight in September 2019 was 225  pounds, she was admitted with 260 pounds, Weights inaccurate, but improved upper extremity edema, persistent LE edema. Increase amlodipine to 5 mg daily for blood pressure management. Continue applying TED hoses. She is scheduled for EGD and colonoscopy tomorrow.  Ena Dawley, MD 09/15/2018

## 2018-09-15 NOTE — Progress Notes (Signed)
PROGRESS NOTE    Carrie Chaney  EUM:[id number] DOB: 1941/05/23 DOA: 09/07/2018 PCP: Lucianne Lei, MD   Brief Narrative: Patient is a 78 year old female with history of hypertension, hyperlipidemia who presented with 4 weeks history of progressive lower extremity swelling which continues to worsen despite being started on Bumex.  Chest ray showed cardiomegaly, pulmonary congestion.  Albumin was found to be low.  She was admitted for anasarca and was started on IV diuretics.  She had mild elevated liver enzymes and work-up which included right upper quadrant ultrasound which showed multiple mass in the liver.  Subsequent chest CT/abdomen/pelvis demonstrated bulky metastasis in the right liver and right kidney with omental and nodal metastasis in the abdomen.  Liver biopsy revealed adenocarcinoma of unknown primary.  Oncology was consulted.  GI following and planning for EGD/colonoscopy tomorrow.  Cardiology is following.  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  Chest x-ray showed  pulmonary edema.  She had severe bilateral lower extremity edema.  Hypoalbuminemia and diastolic dysfunction are suspected to be primary contributors.  Lasix changed to oral 80 mg twice a day because of worsening kidney function.  Cardiology following.  We will continue monitor input and output.  Foley inserted today because of retention. Continue TED hose for bilateral lower extremity edema.  Metastatic adenocarcinoma of unknown primary: Bulky metastasis disease in the liver, extension to the right kidney superior pole, involvement of perigastric, porta hepatis and omentum.  Elevated CEA.  Oncology consulted.  Recommend EGD and colonoscopy  done tomorrow. CT also showed sigmoid colonic mass 4 cm.   Acute kidney injury: Foley inserted today.  Had 1000 cc on bladder residual check.  Leukocytosis: Improving.  Right upper extremity swelling: Improved with elevation.  No DVT or SVT on ultrasound.  Abdominal pain: Most likely associated with distention with bulky metastatic disease.  Continue supportive care.  Pain management  Hypertension: Continue Norvasc.  Continue to monitor blood pressure.  Hypokalemia: Being supplemented  Hyperlipidemia: Continue statin  Proximal muscle weakness: CPK, TSH normal.  PT/OT recommended skilled nursing facility.  Social worker consulted.   Nutrition Problem: Inadequate oral intake Etiology: acute illness      DVT prophylaxis: Lovenox Code Status: Full Family Communication: None present at the bedside Disposition Plan: Skilled nursing facility after full work-up   Consultants: Cardiology, GI, oncology  Procedures: None  Antimicrobials:  Anti-infectives (From admission, onward)   None      Subjective: Patient seen and examined the bedside this morning.  Remains comfortable.  Hemodynamically stable.  Complains of back pain and unable to lie on the bed.  Still has significant bilateral lower extremity edema  Objective: Vitals:   09/14/18 1048 09/14/18 1442 09/14/18 2058 09/15/18 0601  BP: (!) 156/44 (!) 145/48 (!) 141/47 (!) 142/47  Pulse: 77 88 80 73  Resp:   17 18  Temp:  99.1 F (37.3 C) 98.4 F (36.9 C) 97.9 F (36.6 C)  TempSrc:   Oral   SpO2: 92% 95% 93% 94%  Weight:      Height:        Intake/Output Summary (Last 24 hours) at 09/15/2018 1443 Last data filed at 09/15/2018 0700 Gross per 24 hour  Intake 345.02 ml  Output 970 ml  Net -624.98 ml   Filed Weights   09/12/18 0411 09/13/18 0500 09/14/18 0500  Weight: 112.7 kg 113.6 kg 115.3 kg    Examination:  General exam: Morbidly obese, generalized weakness HEENT:PERRL,Oral mucosa moist, Ear/Nose normal  on gross exam Respiratory system: Bilateral equal air entry, normal vesicular breath sounds, no wheezes or crackles  Cardiovascular system: S1 & S2 heard, RRR. No JVD, murmurs, rubs, gallops or clicks.  3-4+ edema on bilateral lower extremities Gastrointestinal system: Abdomen is nondistended, soft and nontender. No organomegaly or masses felt. Normal bowel sounds heard. Central nervous system: Alert and oriented. No focal neurological deficits. Extremities: 3-4+ bilateral pitting lower extremity edema, no clubbing ,no cyanosis, distal peripheral pulses palpable. Skin: No rashes, lesions or ulcers,no icterus ,no pallor    Data Reviewed: I have personally reviewed following labs and imaging studies  CBC: Recent Labs  Lab 09/09/18 0645 09/10/18 0543 09/11/18 0541 09/13/18 0648  WBC 14.3* 16.7* 17.4* 12.3*  HGB 10.9* 11.6* 11.4* 11.2*  HCT 36.0 37.8 37.6 37.3  MCV 94.2 93.1 93.5 94.4  PLT 293 315 329 678   Basic Metabolic Panel: Recent Labs  Lab 09/10/18 0543 09/11/18 0541 09/12/18 0707 09/13/18 0648 09/14/18 0649 09/15/18 0558  NA 145 143 144 144 146* 146*  K 3.9 4.6 3.6 5.1 2.8* 3.4*  CL 106 105 104 105 107 106  CO2 31 30 29 29 28 30   GLUCOSE 152* 161* 134* 142* 162* 167*  BUN 29* 33* 36* 39* 43* 55*  CREATININE 1.15* 1.01* 1.02* 1.01* 1.02* 1.30*  CALCIUM 8.1* 8.2* 8.2* 8.1* 8.1* 8.0*  MG 2.1  --  2.0  --   --   --    GFR: Estimated Creatinine Clearance: 47.6 mL/min (A) (by C-G formula based on SCr of 1.3 mg/dL (H)). Liver Function Tests: Recent Labs  Lab 09/12/18 0707  AST 43*  ALT 47*  ALKPHOS 238*  BILITOT 1.1  PROT 5.6*  ALBUMIN 2.1*   No results for input(s): LIPASE, AMYLASE in the last 168 hours. No results for input(s): AMMONIA in the last 168 hours. Coagulation Profile: No results for input(s): INR, PROTIME in the last 168 hours. Cardiac Enzymes: No results for input(s): CKTOTAL, CKMB, CKMBINDEX, TROPONINI in the last 168 hours. BNP (last 3  results) No results for input(s): PROBNP in the last 8760 hours. HbA1C: No results for input(s): HGBA1C in the last 72 hours. CBG: No results for input(s): GLUCAP in the last 168 hours. Lipid Profile: No results for input(s): CHOL, HDL, LDLCALC, TRIG, CHOLHDL, LDLDIRECT in the last 72 hours. Thyroid Function Tests: No results for input(s): TSH, T4TOTAL, FREET4, T3FREE, THYROIDAB in the last 72 hours. Anemia Panel: No results for input(s): VITAMINB12, FOLATE, FERRITIN, TIBC, IRON, RETICCTPCT in the last 72 hours. Sepsis Labs: No results for input(s): PROCALCITON, LATICACIDVEN in the last 168 hours.  Recent Results (from the past 240 hour(s))  Surgical PCR screen     Status: None   Collection Time: 09/14/18  4:46 PM  Result Value Ref Range Status   MRSA, PCR NEGATIVE NEGATIVE Final   Staphylococcus aureus NEGATIVE NEGATIVE Final    Comment: (NOTE) The Xpert SA Assay (FDA approved for NASAL specimens in patients 20 years of age and older), is one component of a comprehensive surveillance program. It is not intended to diagnose infection nor to guide or monitor treatment. Performed at [facility], [place] [address], [place], [place] [address]          Radiology Studies:  No results found.      Scheduled Meds: . amLODipine  5 mg Oral Daily  . enoxaparin (LOVENOX) injection  40 mg Subcutaneous Daily  . feeding supplement (PRO-STAT SUGAR FREE 64)  30 mL Oral Daily  . furosemide  80 mg Oral BID  . hydrALAZINE  50 mg Oral Q8H  . multivitamin with minerals  1 tablet Oral Daily  . polyethylene glycol-electrolytes  4,000 mL Oral Once  . potassium chloride  40 mEq Oral BID  . simethicone  80 mg Oral QID  . sodium chloride flush  3 mL Intravenous Q12H  . spironolactone  25 mg Oral Daily   Continuous Infusions: . sodium chloride Stopped (09/09/18 1241)  . sodium chloride 20 mL/hr at 09/14/18 1805     LOS: 7 days    Time spent: 35 mins.More than 50% of  that time was spent in counseling and/or coordination of care.      Shelly Coss, MD [facility] Pager [phone]  If 7PM-7AM, please contact night-coverage [email or web address] Password [facility] 09/15/2018, 2:43 PM

## 2018-09-15 NOTE — Progress Notes (Signed)
Occupational Therapy Treatment Patient Details Name: Carrie Chaney MRN: [id number] DOB: 24-Feb-1941 Today's Date: 09/15/2018    History of present illness 78 yo female admitted with progressive weakness, falls, bil LE edema, anasarca, CHF. 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 Hx of Dm, CHF.    OT comments  Co treat with PT.  Pt needed MAX encouragement but did participate with therapy  Follow Up Recommendations  SNF          Precautions / Restrictions Precautions Precautions: Fall Restrictions Weight Bearing Restrictions: No       Mobility Bed Mobility   Bed Mobility: Rolling Rolling: Mod assist            Transfers Overall transfer level: Needs assistance Equipment used: Rolling walker (2 wheeled) Transfers: Sit to/from Stand Sit to Stand: +2 physical assistance;+2 safety/equipment;Max assist         General transfer comment: Pt had been in recliner since previous day and was very fatigued. Pt chose to stay in recliner and nursing had attempted a few times with other lift equipment that did not work with pt . Pt reports being very fearful of falling with attempts to be on her feet. She needs reassurance and comfort when attemting to mobilize. We stood with plus 2 help for about 15 seconds with encouragement before sitting back in recliner. Pt with decreased control from BLEs for stand to sit portion. For safety decided to use [facility] lift for back to bed transition.     Balance Overall balance assessment: Needs assistance;History of Falls Sitting-balance support: Bilateral upper extremity supported;Feet supported Sitting balance-Leahy Scale: Fair     Standing balance support: Bilateral upper extremity supported;During functional activity Standing balance-Leahy Scale: Poor Standing balance comment: reliance on RW                           ADL either performed or assessed with clinical judgement   ADL Overall ADL's :  Needs assistance/impaired     Grooming: Wash/dry face;Set up;Sitting                                       Vision Baseline Vision/History: No visual deficits            Cognition Arousal/Alertness: Awake/alert Behavior During Therapy: WFL for tasks assessed/performed Overall Cognitive Status: Within Functional Limits for tasks assessed                                                     Pertinent Vitals/ Pain       Pain Assessment: No/denies pain         Frequency  Min 2X/week        Progress Toward Goals  OT Goals(current goals can now be found in the care plan section)  Progress towards OT goals: OT to reassess next treatment  Acute Rehab OT Goals Patient Stated Goal: I want to feel better   Plan Discharge plan remains appropriate    Co-evaluation      Reason for Co-Treatment: Complexity of the patient's impairments (multi-system involvement);For patient/therapist safety;To address functional/ADL transfers PT goals addressed during session: Mobility/safety with mobility        AM-PAC OT "6 Clicks"  Daily Activity     Outcome Measure   Help from another person eating meals?: None Help from another person taking care of personal grooming?: A Little Help from another person toileting, which includes using toliet, bedpan, or urinal?: Total Help from another person bathing (including washing, rinsing, drying)?: A Lot Help from another person to put on and taking off regular upper body clothing?: A Little Help from another person to put on and taking off regular lower body clothing?: Total 6 Click Score: 14    End of Session Equipment Utilized During Treatment: Rolling walker;Other (comment)(maxi move)  OT Visit Diagnosis: Unsteadiness on feet (R26.81);Muscle weakness (generalized) (M62.81)   Activity Tolerance Patient tolerated treatment well   Patient Left in bed;with bed alarm set   Nurse Communication Mobility  status;Precautions        Time: 2542-7062 OT Time Calculation (min): 38 min  Charges: OT General Charges $OT Visit: 1 Visit OT Treatments $Self Care/Home Management : 8-22 mins  Kari Baars, [place] [phone] Office- [phone]      Kyarra Vancamp, Edwena Felty D 09/15/2018, 1:46 PM

## 2018-09-15 NOTE — Progress Notes (Signed)
[facility] Progress Note  Jennipher Weatherholtz 78 y.o. 1940/10/19  CC: Metastatic adenocarcinoma, abnormal CT scan showing sigmoid colon mass   Subjective: Yesterday's events noted.  Patient had urinary retention as well as difficulty with ambulation.  Continues to deny any GI symptoms.     Objective: Vital signs in last 24 hours: Vitals:   09/14/18 2058 09/15/18 0601  BP: (!) 141/47 (!) 142/47  Pulse: 80 73  Resp: 17 18  Temp: 98.4 F (36.9 C) 97.9 F (36.6 C)  SpO2: 93% 94%    Physical Exam:  General.  Alert/oriented x3.  Not in acute distress.  Resting in the recliner. Abdomen.  Distended, nontender, bowel sounds present.  No peritoneal signs  Lab Results: Recent Labs    09/14/18 0649 09/15/18 0558  NA 146* 146*  K 2.8* 3.4*  CL 107 106  CO2 28 30  GLUCOSE 162* 167*  BUN 43* 55*  CREATININE 1.02* 1.30*  CALCIUM 8.1* 8.0*   No results for input(s): AST, ALT, ALKPHOS, BILITOT, PROT, ALBUMIN in the last 72 hours. Recent Labs    09/13/18 0648  WBC 12.3*  HGB 11.2*  HCT 37.3  MCV 94.4  PLT 346   No results for input(s): LABPROT, INR in the last 72 hours.    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  Possible primary as cholangiocarcinoma versus upper GI. -Anasarca. - Abnormal LFts.   Recommendations ------------------------ -  EGD and colonoscopy tomorrow. -Change diet to clear liquids for today.  N.p.o. past midnight.  Risks (bleeding, infection, bowel perforation that could require surgery, sedation-related changes in cardiopulmonary systems), benefits (identification and possible treatment of source of symptoms, exclusion of certain causes of symptoms), and alternatives (watchful waiting, radiographic imaging studies, empiric medical treatment)  were explained to patient/family in detail and patient wishes to proceed.   Otis Brace MD, [place] 09/15/2018, 10:24 AM  Contact #  [phone]

## 2018-09-15 NOTE — Progress Notes (Signed)
Physical Therapy Treatment Patient Details Name: Carrie Chaney MRN: [id number] DOB: 1941/07/12 Today's Date: 09/15/2018    History of Present Illness 78 yo female admitted with progressive weakness, falls, bil LE edema, anasarca, CHF. 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 Hx of Dm, CHF.     PT Comments    Pt was tired and expressed fear of mobilizing , transfers and attempting to walk. Pt had been up in recliner since yesterday. Worked on static standing with great encouragement , then helped with [facility] for lift pt back to bed. Encouraged pt to continue to work on multiple sit to stand and attempting to continue to mobilize with PT an OT and nursing staff to [place] the lift for OOB transfers. Will continue to follow from PT standpoint.      Follow Up Recommendations  SNF     Equipment Recommendations       Recommendations for Other Services       Precautions / Restrictions Precautions Precautions: Fall Restrictions Weight Bearing Restrictions: No    Mobility  Bed Mobility                  Transfers Overall transfer level: Needs assistance Equipment used: Rolling walker (2 wheeled) Transfers: Sit to/from Stand Sit to Stand: +2 physical assistance;+2 safety/equipment;Max assist         General transfer comment: Pt had been in recliner since previous day and was very fatigued. Pt chose to stay in recliner and nursing had attempted a few times with other lift equipment that did not work with pt . Pt reports being very fearful of falling with attempts to be on her feet. She needs reassurance and comfort when attemting to mobilize. We stood with plus 2 help for about 15 seconds with encouragement before sitting back in recliner. Pt with decreased control from BLEs for stand to sit portion. For safety decided to use [facility] lift for back to bed transition.   Ambulation/Gait                 Stairs             Wheelchair  Mobility    Modified Rankin (Stroke Patients Only)       Balance Overall balance assessment: Needs assistance;History of Falls Sitting-balance support: Bilateral upper extremity supported;Feet supported Sitting balance-Leahy Scale: Fair     Standing balance support: Bilateral upper extremity supported;During functional activity Standing balance-Leahy Scale: Poor Standing balance comment: reliance on RW                            Cognition Arousal/Alertness: Awake/alert Behavior During Therapy: WFL for tasks assessed/performed Overall Cognitive Status: Within Functional Limits for tasks assessed                                        Exercises Total Joint Exercises Long Arc Quad: AROM;5 reps;Both;Seated(with static hold at number 5)    General Comments        Pertinent Vitals/Pain Pain Assessment: No/denies pain    Home Living                      Prior Function            PT Goals (current goals can now be found in the care plan section) Acute Rehab PT Goals  Patient Stated Goal: I want to feel better  PT Goal Formulation: With patient Time For Goal Achievement: 10/07/18 Potential to Achieve Goals: Good Progress towards PT goals: Progressing toward goals(slow progression and has become more fearful limiting her mobility at this time )    Frequency    Min 3X/week      PT Plan Current plan remains appropriate    Co-evaluation PT/OT/SLP Co-Evaluation/Treatment: Yes Reason for Co-Treatment: For patient/therapist safety;Complexity of the patient's impairments (multi-system involvement) PT goals addressed during session: Mobility/safety with mobility        AM-PAC PT "6 Clicks" Mobility   Outcome Measure  Help needed turning from your back to your side while in a flat bed without using bedrails?: A Lot Help needed moving from lying on your back to sitting on the side of a flat bed without using bedrails?: A Lot Help  needed moving to and from a bed to a chair (including a wheelchair)?: A Lot Help needed standing up from a chair using your arms (e.g., wheelchair or bedside chair)?: A Lot Help needed to walk in hospital room?: Total Help needed climbing 3-5 steps with a railing? : Total 6 Click Score: 10    End of Session Equipment Utilized During Treatment: Gait belt Activity Tolerance: Patient limited by fatigue(pt stated taht she is very tired " haven't had much sleep" but when asked sounds like she sleeps until 4:00 whn lab arrives. ) Patient left: with nursing/sitter in room;in bed Nurse Communication: Mobility status;Need for lift equipment(use [employer] for OOB until progress more with PT/OT ) PT Visit Diagnosis: Muscle weakness (generalized) (M62.81);History of falling (Z91.81);Unsteadiness on feet (R26.81);Difficulty in walking, not elsewhere classified (R26.2);Pain     Time: 0388-8280 PT Time Calculation (min) (ACUTE ONLY): 37 min  Charges:  $Therapeutic Activity: 8-22 mins                     Clide Dales, PT Acute Rehabilitation Services Pager: [phone] Office: [phone] 09/15/2018    Clide Dales 09/15/2018, 11:51 AM

## 2018-09-15 NOTE — Clinical Social Work Note (Signed)
Clinical Social Work Assessment  Patient Details  Name: Carrie Chaney MRN: [id number] Date of Birth: Jun 29, 1941  Date of referral:  09/15/18               Reason for consult:  Facility Placement                Permission sought to share information with:  Family Supports Permission granted to share information::  Yes, Verbal Permission Granted  Name::     Carrie Chaney  Agency::     Relationship::  Sisters  Contact Information:     Housing/Transportation Living arrangements for the past 2 months:  [place] of Information:  Patient, Siblings Patient Interpreter Needed:  None Criminal Activity/Legal Involvement Pertinent to Current Situation/Hospitalization:  No - Comment as needed Significant Relationships:  Warehouse manager, Siblings Lives with:  Self Do you feel safe going back to the place where you live?  Yes Need for family participation in patient care:  Yes (Comment)  Care giving concerns:  Carrie Chaney is a 78 y.o. female with medical history significant of HLD, HTN.  Patient has had progressively worsening BLE edema for the past 1 month.  Legs are weeping clear fluid.  She has been seeing her PCP since onset and started on medications including Bumex, but symptoms have been progressively getting worse to the point that they are now interfering with mobility.  Patient states in the last week she has had difficulty even getting up because her legs are so heavy. She is unable to stand or walk properly.She has had to call 911 for assistance.  No care giving concerns at the time of assessment.   Social Worker assessment / plan:  LCSW consulted for SNF placement.   Original plan was home with [facility]. PT recommending SNF.   LCSW met with patient at bedside. Patients sister is present. LCSW explained role and reason for visit.   At base line patient reports that she is independent in all ADLs and is still driving. Patient reports that she is active in  her community and volunteers often. Patient lives alone but her sister lives a block away and they help each other.   Patient is apprehensive to make a decision at the time of assessment. Patient would like to wait until her procedure tomorrow and would like to discuss with her sisters before deciding SNF vs HH.   PLAN: TBD  Employment status:  Retired Nurse, adult PT Recommendations:  [place] / Referral to community resources:     Patient/Family's Response to care:  Patient and family re thankful for LCSW visit and information.   Patient/Family's Understanding of and Emotional Response to Diagnosis, Current Treatment, and Prognosis:  Patient expressed concerns regarding making a decision too quickly. Patient asked for time to discuss options with family and receive results from procedure.   Emotional Assessment Appearance:  Appears stated age Attitude/Demeanor/Rapport:  Apprehensive Affect (typically observed):  Overwhelmed, Pleasant Orientation:  Oriented to Self, Oriented to Place, Oriented to  Time, Oriented to Situation Alcohol / Substance use:  Not Applicable Psych involvement (Current and /or in the community):  No (Comment)  Discharge Needs  Concerns to be addressed:  No discharge needs identified Readmission within the last 30 days:  No Current discharge risk:  None Barriers to Discharge:  Continued Medical Work up   [employer], LCSW 09/15/2018, 2:01 PM

## 2018-09-15 NOTE — Care Management Important Message (Signed)
Important Message  Patient Details  Name: Carrie Chaney MRN: [id number] Date of Birth: 07-Mar-1941   Medicare Important Message Given:  Yes    Kerin Salen 09/15/2018, 11:51 AMImportant Message  Patient Details  Name: Carrie Chaney MRN: [id number] Date of Birth: 16-May-1941   Medicare Important Message Given:  Yes    Kerin Salen 09/15/2018, 11:51 AM

## 2018-09-15 NOTE — Progress Notes (Signed)
Pt.wanted to sleep on the recliner for the night.Pt. retained urine for the whole night..This nurse decided to do bladder scan  due to  urine  retention.As per order in and out catheter was ordered to drain 1000 ml urine.Pt. has to be moved from the recliner to the bed in order to perform the order.Pt. couldn't  Tolerate to get her moved from the recliner to the bed. This nurse and another nurse tried the best of our ability  to move her to her bed ,pt. is unable to participate lift her weight.The in and out cath was performed as pt. was on the recliner.The Clarise Cruz lift was putting a lot of pressure on her back as to the pt.Pt. stated that she prefers to be moved by PT.

## 2018-09-15 NOTE — Progress Notes (Signed)
Nutrition Follow-up  DOCUMENTATION CODES:   Obesity unspecified  INTERVENTION:   Continue Prostat liquid protein PO 30 ml daily, each supplement provides 100 kcal, 15 grams protein.  NUTRITION DIAGNOSIS:   Inadequate oral intake related to acute illness as evidenced by per patient/family report.  Ongoing.  GOAL:   Patient will meet greater than or equal to 90% of their needs  Progressing.  MONITOR:   PO intake, Supplement acceptance, Weight trends, Labs, I & O's  ASSESSMENT:   78 y.o. female with a history of HTN and HLD. She presented to the ED via EMS 4 weeks of progressive lower extremity swelling which continued despite starting bumex per PCP. This has caused limited mobility. CXR showed cardiomegaly, pulmonary congestion, albumin 2.3 with mild LFT elevations. She was admitted for anasarca, started on IV diuretic, and work up included RUQ U/S which demonstrated multiple masses in the liver. Further work up is underway.  Pt currently on clear liquid diet today and will be NPO after midnight for planned EGD/colonoscopy tomorrow. Pt has been taking Prostat without issue. Was on a heart healthy diet until 2/26, consumed 100% of meals at that time.   Weight has been fluctuating this admission d/t fluid. Pt has been ordered Lasix for diuresis.  Medications: Lasix tablet BID, Multivitamin with minerals daily, K-DUR tablet BID  Labs reviewed: Elevated Na Low K  Diet Order:   Diet Order            Diet NPO time specified  Diet effective midnight        Diet clear liquid Room service appropriate? Yes; Fluid consistency: Thin  Diet effective now              EDUCATION NEEDS:   Education needs have been addressed  Skin:  Skin Assessment: Reviewed RN Assessment  Last BM:  2/24  Height:   Ht Readings from Last 1 Encounters:  09/08/18 5\' 8"  (1.727 m)    Weight:   Wt Readings from Last 1 Encounters:  09/14/18 115.3 kg    Ideal Body Weight:  63.64 kg  BMI:   Body mass index is 38.65 kg/m.  Estimated Nutritional Needs:   Kcal:  1500-1700 kcal  Protein:  70-80 grams  Fluid:  per MD  Clayton Bibles, MS, RD, LDN [place] Dietitian Pager: [phone] After Hours Pager: [phone]

## 2018-09-16 ENCOUNTER — Encounter (HOSPITAL_COMMUNITY)
Admission: EM | Disposition: A | Discharge status: Skilled Nursing Facility | Payer: Self-pay | Source: Home / Self Care | Attending: Family Medicine

## 2018-09-16 ENCOUNTER — Inpatient Hospital Stay (HOSPITAL_COMMUNITY): Payer: Medicare Other | Admitting: Certified Registered Nurse Anesthetist

## 2018-09-16 ENCOUNTER — Encounter (HOSPITAL_COMMUNITY): Payer: Self-pay | Admitting: *Deleted

## 2018-09-16 HISTORY — PX: BIOPSY: SHX5522

## 2018-09-16 HISTORY — PX: COLONOSCOPY WITH PROPOFOL: SHX5780

## 2018-09-16 HISTORY — PX: ESOPHAGOGASTRODUODENOSCOPY (EGD) WITH PROPOFOL: SHX5813

## 2018-09-16 LAB — BASIC METABOLIC PANEL
Anion gap: 9 (ref 5–15)
BUN: 57 mg/dL — ABNORMAL HIGH (ref 8–23)
CO2: 28 mmol/L (ref 22–32)
Calcium: 7.9 mg/dL — ABNORMAL LOW (ref 8.9–10.3)
Chloride: 107 mmol/L (ref 98–111)
Creatinine, Ser: 1.3 mg/dL — ABNORMAL HIGH (ref 0.44–1.00)
GFR calc Af Amer: 46 mL/min — ABNORMAL LOW (ref >60)
GFR calc non Af Amer: 39 mL/min — ABNORMAL LOW (ref >60)
Glucose, Bld: 145 mg/dL — ABNORMAL HIGH (ref 70–99)
Potassium: 4.4 mmol/L (ref 3.5–5.1)
Sodium: 144 mmol/L (ref 135–145)

## 2018-09-16 SURGERY — ESOPHAGOGASTRODUODENOSCOPY (EGD) WITH PROPOFOL
Anesthesia: Monitor Anesthesia Care

## 2018-09-16 MED ORDER — PROPOFOL 10 MG/ML IV BOLUS
INTRAVENOUS | Status: DC | PRN
  Administered 2018-09-16 (×4): 20 mg via INTRAVENOUS
  Administered 2018-09-16: 10 mg via INTRAVENOUS
  Administered 2018-09-16: 20 mg via INTRAVENOUS
  Administered 2018-09-16: 10 mg via INTRAVENOUS

## 2018-09-16 MED ORDER — PROPOFOL 500 MG/50ML IV EMUL
INTRAVENOUS | Status: DC | PRN
  Administered 2018-09-16: 50 ug/kg/min via INTRAVENOUS

## 2018-09-16 MED ORDER — LACTATED RINGERS IV SOLN
INTRAVENOUS | Status: DC | PRN
  Administered 2018-09-16: 11:00:00 via INTRAVENOUS

## 2018-09-16 MED ORDER — PROPOFOL 10 MG/ML IV BOLUS
INTRAVENOUS | Status: AC
  Filled 2018-09-16: qty 60

## 2018-09-16 MED ORDER — LIDOCAINE 2% (20 MG/ML) 5 ML SYRINGE
INTRAMUSCULAR | Status: DC | PRN
  Administered 2018-09-16: 80 mg via INTRAVENOUS

## 2018-09-16 MED ORDER — NYSTATIN 100000 UNIT/ML MT SUSP
5.0000 mL | Freq: Four times a day (QID) | OROMUCOSAL | Status: DC
  Administered 2018-09-16 – 2018-09-20 (×15): 500000 [IU] via ORAL
  Filled 2018-09-16 (×15): qty 5

## 2018-09-16 MED ORDER — PHENYLEPHRINE 40 MCG/ML (10ML) SYRINGE FOR IV PUSH (FOR BLOOD PRESSURE SUPPORT)
PREFILLED_SYRINGE | INTRAVENOUS | Status: DC | PRN
  Administered 2018-09-16 (×2): 120 ug via INTRAVENOUS
  Administered 2018-09-16: 80 ug via INTRAVENOUS
  Administered 2018-09-16 (×3): 120 ug via INTRAVENOUS

## 2018-09-16 SURGICAL SUPPLY — 24 items

## 2018-09-16 NOTE — Transfer of Care (Signed)
Immediate Anesthesia Transfer of Care Note  Patient: Carrie Chaney  Procedure(s) Performed: ESOPHAGOGASTRODUODENOSCOPY (EGD) WITH PROPOFOL (N/A ) COLONOSCOPY WITH PROPOFOL (N/A ) BIOPSY  Patient Location: Endoscopy Unit  Anesthesia Type:MAC  Level of Consciousness: drowsy and patient cooperative  Airway & Oxygen Therapy: Patient Spontanous Breathing and Patient connected to nasal cannula oxygen  Post-op Assessment: Report given to RN and Post -op Vital signs reviewed and stable  Post vital signs: Reviewed and stable  Last Vitals:  Vitals Value Taken Time  BP 129/24 09/16/2018 11:55 AM  Temp    Pulse 77 09/16/2018 11:57 AM  Resp 26 09/16/2018 11:57 AM  SpO2 98 % 09/16/2018 11:57 AM  Vitals shown include unvalidated device data.  Last Pain:  Vitals:   09/16/18 0954  TempSrc: Oral  PainSc: 0-No pain      Patients Stated Pain Goal: 0 (98/92/11 9417)  Complications: No apparent anesthesia complications

## 2018-09-16 NOTE — Progress Notes (Signed)
LCSW following for SNF placement.   Patient hs chosen bed at [facility]. Facility has started [place].   Patient awaiting insurance auth.  LCSW will continue to follow.  Carolin Coy Raymond Long [place]

## 2018-09-16 NOTE — NC FL2 (Signed)
[place] MEDICAID FL2 LEVEL OF CARE SCREENING TOOL     IDENTIFICATION  Patient Name: Carrie Chaney Birthdate: 1941/05/11 Sex: female Admission Date (Current Location): 09/07/2018  [facility] and [place] Number:  Herbalist and Address:  [facility],  [place] [address] [place]      Provider Number: [id number]  Attending Physician Name and Address:  Shelly Coss, MD  Relative Name and Phone Number:       Current Level of Care: Hospital Recommended Level of Care: [place] Prior Approval Number:    Date Approved/Denied:   PASRR Number: [id number]  Discharge Plan: SNF    Current Diagnoses: Patient Active Problem List   Diagnosis Date Noted  . 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 Abnormal LFTs   . Weakness   . Anasarca 09/07/2018  . Elevated liver enzymes 09/07/2018  . Proximal limb muscle weakness 09/07/2018  . Dehydration 08/15/2016  . Acute renal failure (ARF) ([place]) 08/15/2016  . Leukocytosis 08/15/2016  . Hypertension 08/15/2016  . Postural dizziness with presyncope 08/15/2016    Orientation RESPIRATION BLADDER Height & Weight     Self, Time, Situation, Place  Normal Continent Weight: 248 lb 3.8 oz (112.6 kg) Height:  5\' 8"  (172.7 cm)  BEHAVIORAL SYMPTOMS/MOOD NEUROLOGICAL BOWEL NUTRITION STATUS      Continent Diet(See dc summary)  AMBULATORY STATUS COMMUNICATION OF NEEDS Skin   Extensive Assist Verbally Normal                       Personal Care Assistance Level of Assistance  Bathing, Feeding, Dressing Bathing Assistance: Limited assistance Feeding assistance: Independent Dressing Assistance: Limited assistance     Functional Limitations Info  Sight, Speech, Hearing Sight Info: Adequate Hearing Info: Adequate Speech Info: Adequate    SPECIAL CARE FACTORS FREQUENCY  PT (By licensed PT), OT (By licensed OT)     PT Frequency: 5x/week OT Frequency: 5x/week             Contractures Contractures Info: Not present    Additional Factors Info  Code Status, Allergies Code Status Info: Full Allergies Info: Tomato           Current Medications (09/16/2018):  This is the current hospital active medication list Current Facility-Administered Medications  Medication Dose Route Frequency Provider Last Rate Last Dose  . 0.9 %  sodium chloride infusion  250 mL Intravenous PRN Etta Quill, DO   Stopped at 09/09/18 1241  . acetaminophen (TYLENOL) tablet 650 mg  650 mg Oral Q4H PRN Etta Quill, DO      . amLODipine (NORVASC) tablet 5 mg  5 mg Oral Daily [place], [place], [place]   5 mg at 09/16/18 1231  . enoxaparin (LOVENOX) injection 40 mg  40 mg Subcutaneous Daily Allred, Darrell K, PA-C   40 mg at 09/15/18 1157  . feeding supplement (PRO-STAT SUGAR FREE 64) liquid 30 mL  30 mL Oral Daily Patrecia Pour, MD   30 mL at 09/15/18 1158  . furosemide (LASIX) tablet 80 mg  80 mg Oral BID Dorothy Spark, MD   80 mg at 09/16/18 1233  . hydrALAZINE (APRESOLINE) tablet 50 mg  50 mg Oral Q8H Fay Records, MD   50 mg at 09/16/18 1410  . multivitamin with minerals tablet 1 tablet  1 tablet Oral Daily Patrecia Pour, MD   1 tablet at 09/16/18 1232  . nystatin (MYCOSTATIN) 100000 UNIT/ML suspension 500,000 Units  5  mL Oral QID Otis Brace, MD   500,000 Units at 09/16/18 1410  . ondansetron (ZOFRAN) injection 4 mg  4 mg Intravenous Q6H PRN Etta Quill, DO      . oxyCODONE (Oxy IR/ROXICODONE) immediate release tablet 5 mg  5 mg Oral Q4H PRN Patrecia Pour, MD   5 mg at 09/11/18 0533  . potassium chloride SA (K-DUR,KLOR-CON) CR tablet 40 mEq  40 mEq Oral BID Patrecia Pour, MD   40 mEq at 09/16/18 1232  . simethicone (MYLICON) chewable tablet 80 mg  80 mg Oral QID Patrecia Pour, MD   80 mg at 09/16/18 1230  . sodium chloride flush (NS) 0.9 % injection 3 mL  3 mL Intravenous Q12H Jennette Kettle M, DO   3 mL at 09/16/18 1232  . sodium chloride flush (NS) 0.9 % injection 3  mL  3 mL Intravenous PRN Etta Quill, DO      . spironolactone (ALDACTONE) tablet 25 mg  25 mg Oral Daily Dorothy Spark, MD   25 mg at 09/16/18 1230     Discharge Medications: Please see discharge summary for a list of discharge medications.  Relevant Imaging Results:  Relevant Lab Results:   Additional Information SSN: [id number]  Servando Snare, LCSW

## 2018-09-16 NOTE — Progress Notes (Addendum)
[place] INPATIENT PROGRESS NOTE  SUBJECTIVE: Carrie Chaney 78 y.o. female seen for follow-up of adenocarcinoma, unknown primary. She had a colonoscopy and EGD performed this morning.  Procedure records reviewed.  I also discussed with Dr. Alessandra Bevels. Results of the colonoscopy showed hemorrhagic, inflamed, nodular and thickened folds of the mucosa in the sigmoid colon, diverticulosis in the sigmoid colon and in the descending colon, thickened folds of the mucosa in the distal rectum. EGD showed esophageal plaques were found, consistent with candidiasis, non-bleeding erosive gastropathy, nodular mucosa in the duodenal bulb, and normal second portion of the duodenum.  When seen today, the patient is sleeping quietly.  Has ongoing significant bilateral lower extremity edema.  No additional complaints.  ALLERGIES:  is allergic to tomato.  MEDICATIONS:  Current Facility-Administered Medications  Medication Dose Route Frequency Provider Last Rate Last Dose  . 0.9 %  sodium chloride infusion  250 mL Intravenous PRN Etta Quill, DO   Stopped at 09/09/18 1241  . acetaminophen (TYLENOL) tablet 650 mg  650 mg Oral Q4H PRN Etta Quill, DO      . amLODipine (NORVASC) tablet 5 mg  5 mg Oral Daily [place], [place], [place]   5 mg at 09/16/18 1231  . enoxaparin (LOVENOX) injection 40 mg  40 mg Subcutaneous Daily Allred, Darrell K, PA-C   40 mg at 09/15/18 1157  . feeding supplement (PRO-STAT SUGAR FREE 64) liquid 30 mL  30 mL Oral Daily Patrecia Pour, MD   30 mL at 09/15/18 1158  . furosemide (LASIX) tablet 80 mg  80 mg Oral BID Dorothy Spark, MD   80 mg at 09/16/18 1233  . hydrALAZINE (APRESOLINE) tablet 50 mg  50 mg Oral Q8H Fay Records, MD   50 mg at 09/16/18 8916  . multivitamin with minerals tablet 1 tablet  1 tablet Oral Daily Patrecia Pour, MD   1 tablet at 09/16/18 1232  . nystatin (MYCOSTATIN) 100000 UNIT/ML suspension 500,000 Units  5 mL Oral QID Brahmbhatt, Parag, MD       . ondansetron (ZOFRAN) injection 4 mg  4 mg Intravenous Q6H PRN Etta Quill, DO      . oxyCODONE (Oxy IR/ROXICODONE) immediate release tablet 5 mg  5 mg Oral Q4H PRN Patrecia Pour, MD   5 mg at 09/11/18 0533  . potassium chloride SA (K-DUR,KLOR-CON) CR tablet 40 mEq  40 mEq Oral BID Patrecia Pour, MD   40 mEq at 09/16/18 1232  . simethicone (MYLICON) chewable tablet 80 mg  80 mg Oral QID Patrecia Pour, MD   80 mg at 09/16/18 1230  . sodium chloride flush (NS) 0.9 % injection 3 mL  3 mL Intravenous Q12H Jennette Kettle M, DO   3 mL at 09/16/18 1232  . sodium chloride flush (NS) 0.9 % injection 3 mL  3 mL Intravenous PRN Etta Quill, DO      . spironolactone (ALDACTONE) tablet 25 mg  25 mg Oral Daily Dorothy Spark, MD   25 mg at 09/16/18 1230    REVIEW OF SYSTEMS:   Review of Systems  Constitutional: Positive for malaise/fatigue. Negative for chills and fever.  HENT: Negative.   Eyes: Negative.   Respiratory: Negative.   Cardiovascular: Positive for leg swelling. Negative for chest pain.  Gastrointestinal: Negative.   Genitourinary: Negative.   Musculoskeletal: Negative.   Skin: Negative.   Neurological: Negative.   Endo/Heme/Allergies: Negative.   Psychiatric/Behavioral: Negative.  PHYSICAL EXAMINATION:  Vital signs in last 24 hours: Temp:  [98 F (36.7 C)-98.7 F (37.1 C)] 98 F (36.7 C) (02/28 1155) Pulse Rate:  [73-81] 73 (02/28 1206) Resp:  [17-28] 26 (02/28 1206) BP: (127-150)/(25-48) 129/48 (02/28 1206) SpO2:  [91 %-97 %] 92 % (02/28 1215) Weight:  [248 lb 3.8 oz (112.6 kg)] 248 lb 3.8 oz (112.6 kg) (02/28 0954) Weight change:  Last BM Date: 09/12/18  ECOG PERFORMANCE STATUS: 2 - Symptomatic, <50% confined to bed  Intake/Output from previous day: 02/27 0701 - 02/28 0700 In: -  Out: [place]: Alert, awake without distress. Head: Normocephalic atraumatic. Mouth: mucous membranes moist Eyes: No scleral icterus.  Pupils are equal  and round reactive to light. Resp: clear to auscultation bilaterally without rhonchi or wheezes or dullness to percussion. Cardio: regular rate and rhythm, S1, S2 normal, no murmur, click, rub or gallop. 2-3+ BLE edema.  GI: soft, non-tender; bowel sounds normal; no masses,  no organomegaly Musculoskeletal: No joint deformity or effusion. Neurological: No motor, sensory deficits.  Intact deep tendon reflexes. Skin: No rashes or lesions.  LABORATORY DATA: Lab Results  Component Value Date   WBC 12.3 (H) 09/13/2018   HGB 11.2 (L) 09/13/2018   HCT 37.3 09/13/2018   MCV 94.4 09/13/2018   PLT 346 09/13/2018   CMP Latest Ref Rng & Units 09/16/2018 09/15/2018 09/14/2018  Glucose 70 - 99 mg/dL 145(H) 167(H) 162(H)  BUN 8 - 23 mg/dL 57(H) 55(H) 43(H)  Creatinine 0.44 - 1.00 mg/dL 1.30(H) 1.30(H) 1.02(H)  Sodium 135 - 145 mmol/L 144 146(H) 146(H)  Potassium 3.5 - 5.1 mmol/L 4.4 3.4(L) 2.8(L)  Chloride 98 - 111 mmol/L 107 106 107  CO2 22 - 32 mmol/L 28 30 28   Calcium 8.9 - 10.3 mg/dL 7.9(L) 8.0(L) 8.1(L)  Total Protein 6.5 - 8.1 g/dL - - -  Total Bilirubin 0.3 - 1.2 mg/dL - - -  Alkaline Phos 38 - 126 U/L - - -  AST 15 - 41 U/L - - -  ALT 0 - 44 U/L - - -   RADIOGRAPHIC STUDIES:  Dg Chest 2 View  Result Date: 09/07/2018 CLINICAL DATA:  Swelling of the left lower extremity for a month. Dyspnea. EXAM: CHEST - 2 VIEW COMPARISON:  None. FINDINGS: Mild cardiomegaly with aortic atherosclerosis. Central vascular congestion consistent with mild CHF is noted. Atelectasis is seen at the right lung base. IMPRESSION: Cardiomegaly with aortic atherosclerosis. Central vascular congestion consistent with mild CHF. Electronically Signed   By: [name] M.D.   On: 09/07/2018 20:01   Ct Chest W Contrast  Result Date: 09/08/2018 CLINICAL DATA:  Liver masses on ultrasound earlier today.  Anasarca. EXAM: CT CHEST, ABDOMEN, AND PELVIS WITH CONTRAST TECHNIQUE: Multidetector CT imaging of the chest, abdomen and  pelvis was performed following the standard protocol during bolus administration of intravenous contrast. CONTRAST:  171mL OMNIPAQUE IOHEXOL 300 MG/ML SOLN, 67mL OMNIPAQUE IOHEXOL 300 MG/ML SOLN COMPARISON:  Ultrasound exam 09/08/2018 FINDINGS: CT CHEST FINDINGS Cardiovascular: The heart is enlarged. Coronary artery calcification is evident. Atherosclerotic calcification is noted in the wall of the thoracic aorta. Mediastinum/Nodes: No mediastinal lymphadenopathy. There is no hilar lymphadenopathy. There is no axillary lymphadenopathy. The esophagus has normal imaging features. Lungs/Pleura: The central tracheobronchial airways are patent. Subsegmental atelectasis noted in the dependent lower lungs bilaterally. No suspicious pulmonary nodule or mass. No substantial pleural effusion. Musculoskeletal: No worrisome lytic or sclerotic osseous abnormality. CT ABDOMEN PELVIS FINDINGS Hepatobiliary: As seen on the recent  ultrasound exam, there are multiple liver masses, bulky year in the right lobe and in the left. Index lesion in the medial right liver measures 6.9 x 5.0 cm on 54/2. Index lesion in the medial segment left liver measures 2.5 cm. A large exophytic lesion seen from the inferior right liver extends into Morison's pouch and generates substantial mass-effect on the right kidney. There is no evidence for gallstones, gallbladder wall thickening, or pericholecystic fluid. No intrahepatic or extrahepatic biliary dilation. Pancreas: No focal mass lesion. No dilatation of the main duct. No intraparenchymal cyst. No peripancreatic edema. Spleen: No splenomegaly. No focal mass lesion. Adrenals/Urinary Tract: Right adrenal gland not visualized. Left adrenal gland unremarkable. 4.1 cm cyst noted in the upper pole left kidney. The mass in the inferior right liver extending down to the upper pole the right kidney does generate mass-effect on the right kidney but in some regions, appears to extend into the upper pole  parenchyma of the right kidney (see coronal image 96 of series 5). 7 clear whether this is an exophytic mass arising from the upper right kidney or potentially hepatic origin invading the upper pole the right kidney. No evidence for hydroureter. The urinary bladder appears normal for the degree of distention. Stomach/Bowel: Stomach is unremarkable. No gastric wall thickening. No evidence of outlet obstruction. Duodenum is normally positioned as is the ligament of Treitz. No small bowel wall thickening. No small bowel dilatation. The terminal ileum is normal. The appendix is not visualized, but there is no edema or inflammation in the region of the cecum. Right colon and transverse colon are unremarkable. Diverticular changes are noted in the left colon. 3.4 x 3.8 cm soft tissue mass is identified along the sigmoid colon (99/2). Vascular/Lymphatic: There is abdominal aortic atherosclerosis without aneurysm. IVC is displaced to the left by the inferior right hepatic disease. 1.6 cm short axis hepato duodenal ligament lymph node evident on 58/2. 1.9 cm lymph node in the porta hepatis visible on 62/2. 10 mm short axis aortocaval lymph node visible on 72/2. No pelvic sidewall lymphadenopathy. Reproductive: Uterus surgically absent.  There is no adnexal mass. Other: Small volume free fluid seen adjacent to the liver and spleen as well as in the cul-de-sac. 16 mm perigastric nodule noted on 67/2. Similar adjacent nodule measures up to 2.0 cm. Multiple omental nodules are evident including 2.0 x 1.4 cm left omental nodule on 88/2. Musculoskeletal: No worrisome lytic or sclerotic osseous abnormality. IMPRESSION: 1. 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 2. Bulky metastatic disease is identified between the right liver in the upper pole the right kidney. This appears to involve both the liver and the kidney and is unclear whether this is primarily from the  liver invading the upper pole the right kidney or potentially a right upper pole renal neoplasm invading the liver. 3. Perigastric, porta hepatis, and omental metastatic disease. Borderline enlarged retroperitoneal lymph nodes also suggest metastatic involvement. 4. 4 cm soft tissue mass associated with the sigmoid colon. While this could certainly be a primary colonic neoplasm, it has a severe coal configuration and does not have typical colorectal cancer CT imaging appearance. Given the metastatic disease elsewhere in the abdomen/pelvis, this could also represent metastatic disease in the pelvis. 5. Given the findings immediately above, no obvious primary source for the diffuse metastatic disease is evident. If the lesion along the sigmoid colon ends up being colorectal cancer, than the other findings in the abdomen and  pelvis are certainly consistent with metastatic disease. Right adrenal or right renal cancer with direct liver invasion along with liver metastases, peritoneal/omental disease and metastatic lymphadenopathy would also be a consideration. 6. No evidence for metastatic disease in the thorax. 7. Aortic Atherosclerois (ICD10-170.0) Electronically Signed   By: Misty Stanley M.D.   On: 09/08/2018 18:30   Ct Abdomen Pelvis W Contrast  Result Date: 09/08/2018 CLINICAL DATA:  Liver masses on ultrasound earlier today.  Anasarca. EXAM: CT CHEST, ABDOMEN, AND PELVIS WITH CONTRAST TECHNIQUE: Multidetector CT imaging of the chest, abdomen and pelvis was performed following the standard protocol during bolus administration of intravenous contrast. CONTRAST:  146mL OMNIPAQUE IOHEXOL 300 MG/ML SOLN, 63mL OMNIPAQUE IOHEXOL 300 MG/ML SOLN COMPARISON:  Ultrasound exam 09/08/2018 FINDINGS: CT CHEST FINDINGS Cardiovascular: The heart is enlarged. Coronary artery calcification is evident. Atherosclerotic calcification is noted in the wall of the thoracic aorta. Mediastinum/Nodes: No mediastinal lymphadenopathy. There  is no hilar lymphadenopathy. There is no axillary lymphadenopathy. The esophagus has normal imaging features. Lungs/Pleura: The central tracheobronchial airways are patent. Subsegmental atelectasis noted in the dependent lower lungs bilaterally. No suspicious pulmonary nodule or mass. No substantial pleural effusion. Musculoskeletal: No worrisome lytic or sclerotic osseous abnormality. CT ABDOMEN PELVIS FINDINGS Hepatobiliary: As seen on the recent ultrasound exam, there are multiple liver masses, bulky year in the right lobe and in the left. Index lesion in the medial right liver measures 6.9 x 5.0 cm on 54/2. Index lesion in the medial segment left liver measures 2.5 cm. A large exophytic lesion seen from the inferior right liver extends into Morison's pouch and generates substantial mass-effect on the right kidney. There is no evidence for gallstones, gallbladder wall thickening, or pericholecystic fluid. No intrahepatic or extrahepatic biliary dilation. Pancreas: No focal mass lesion. No dilatation of the main duct. No intraparenchymal cyst. No peripancreatic edema. Spleen: No splenomegaly. No focal mass lesion. Adrenals/Urinary Tract: Right adrenal gland not visualized. Left adrenal gland unremarkable. 4.1 cm cyst noted in the upper pole left kidney. The mass in the inferior right liver extending down to the upper pole the right kidney does generate mass-effect on the right kidney but in some regions, appears to extend into the upper pole parenchyma of the right kidney (see coronal image 96 of series 5). 7 clear whether this is an exophytic mass arising from the upper right kidney or potentially hepatic origin invading the upper pole the right kidney. No evidence for hydroureter. The urinary bladder appears normal for the degree of distention. Stomach/Bowel: Stomach is unremarkable. No gastric wall thickening. No evidence of outlet obstruction. Duodenum is normally positioned as is the ligament of Treitz. No  small bowel wall thickening. No small bowel dilatation. The terminal ileum is normal. The appendix is not visualized, but there is no edema or inflammation in the region of the cecum. Right colon and transverse colon are unremarkable. Diverticular changes are noted in the left colon. 3.4 x 3.8 cm soft tissue mass is identified along the sigmoid colon (99/2). Vascular/Lymphatic: There is abdominal aortic atherosclerosis without aneurysm. IVC is displaced to the left by the inferior right hepatic disease. 1.6 cm short axis hepato duodenal ligament lymph node evident on 58/2. 1.9 cm lymph node in the porta hepatis visible on 62/2. 10 mm short axis aortocaval lymph node visible on 72/2. No pelvic sidewall lymphadenopathy. Reproductive: Uterus surgically absent.  There is no adnexal mass. Other: Small volume free fluid seen adjacent to the liver and spleen as well as in the  cul-de-sac. 16 mm perigastric nodule noted on 67/2. Similar adjacent nodule measures up to 2.0 cm. Multiple omental nodules are evident including 2.0 x 1.4 cm left omental nodule on 88/2. Musculoskeletal: No worrisome lytic or sclerotic osseous abnormality. IMPRESSION: 1. 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 2. Bulky metastatic disease is identified between the right liver in the upper pole the right kidney. This appears to involve both the liver and the kidney and is unclear whether this is primarily from the liver invading the upper pole the right kidney or potentially a right upper pole renal neoplasm invading the liver. 3. Perigastric, porta hepatis, and omental metastatic disease. Borderline enlarged retroperitoneal lymph nodes also suggest metastatic involvement. 4. 4 cm soft tissue mass associated with the sigmoid colon. While this could certainly be a primary colonic neoplasm, it has a severe coal configuration and does not have typical colorectal cancer CT imaging  appearance. Given the metastatic disease elsewhere in the abdomen/pelvis, this could also represent metastatic disease in the pelvis. 5. Given the findings immediately above, no obvious primary source for the diffuse metastatic disease is evident. If the lesion along the sigmoid colon ends up being colorectal cancer, than the other findings in the abdomen and pelvis are certainly consistent with metastatic disease. Right adrenal or right renal cancer with direct liver invasion along with liver metastases, peritoneal/omental disease and metastatic lymphadenopathy would also be a consideration. 6. No evidence for metastatic disease in the thorax. 7. Aortic Atherosclerois (ICD10-170.0) Electronically Signed   By: Misty Stanley M.D.   On: 09/08/2018 18:30   US Biopsy (liver)  Result Date: 09/09/2018 INDICATION: LIVER MASS, SUSPECT COLON PRIMARY EXAM: ULTRASOUND LEFT LIVER MASS BIOPSY MEDICATIONS: 1% lidocaine local ANESTHESIA/SEDATION: Moderate (conscious) sedation was employed during this procedure. A total of Versed 1.0 mg and Fentanyl 50 mcg was administered intravenously. Moderate Sedation Time: 10 minutes. The patient's level of consciousness and vital signs were monitored continuously by radiology nursing throughout the procedure under my direct supervision. FLUOROSCOPY TIME:  Fluoroscopy Time: None. COMPLICATIONS: None immediate. PROCEDURE: Informed written consent was obtained from the patient after a thorough discussion of the procedural risks, benefits and alternatives. All questions were addressed. Maximal Sterile Barrier Technique was utilized including caps, mask, sterile gowns, sterile gloves, sterile drape, hand hygiene and skin antiseptic. A timeout was performed prior to the initiation of the procedure. Previous imaging reviewed. Patient positioned supine. Preliminary ultrasound performed. A left lobe lesion is demonstrated in the epigastric region. Overlying skin marked. Under sterile conditions  and local anesthesia, a 17 gauge 6.8 cm access needle was advanced from an anterior epigastric approach to the lesion. Needle position confirmed with ultrasound. 18 gauge core biopsies obtained under direct ultrasound. Images obtained for documentation. Samples were placed in formalin. Needle removed. Patient tolerated the biopsy well. No immediate complication IMPRESSION: Successful ultrasound left liver mass 18 gauge core biopsy Electronically Signed   By: Jerilynn Mages.  Shick M.D.   On: 09/09/2018 14:40   Vas [place] Upper Extremity Venous Duplex  Result Date: 09/12/2018 UPPER VENOUS STUDY  Indications: Swelling Performing Technologist: Oliver Hum RVT  Examination Guidelines: A complete evaluation includes B-mode imaging, spectral Doppler, color Doppler, and power Doppler as needed of all accessible portions of each vessel. Bilateral testing is considered an integral part of a complete examination. Limited examinations for reoccurring indications may be performed as noted.  Right Findings: +----------+------------+----------+---------+-----------+-------+ RIGHT     CompressiblePropertiesPhasicitySpontaneousSummary +----------+------------+----------+---------+-----------+-------+ IJV  Full                 Yes       Yes            +----------+------------+----------+---------+-----------+-------+ Subclavian    Full                 Yes       Yes            +----------+------------+----------+---------+-----------+-------+ Axillary      Full                 Yes       Yes            +----------+------------+----------+---------+-----------+-------+ Brachial      Full                 Yes       Yes            +----------+------------+----------+---------+-----------+-------+ Radial        Full                                          +----------+------------+----------+---------+-----------+-------+ Ulnar         Full                                           +----------+------------+----------+---------+-----------+-------+ Cephalic      Full                                          +----------+------------+----------+---------+-----------+-------+ Basilic       Full                                          +----------+------------+----------+---------+-----------+-------+  Left Findings: +----------+------------+----------+---------+-----------+-------+ LEFT      CompressiblePropertiesPhasicitySpontaneousSummary +----------+------------+----------+---------+-----------+-------+ Subclavian    Full                 Yes       Yes            +----------+------------+----------+---------+-----------+-------+  Summary:  Right: No evidence of deep vein thrombosis in the upper extremity. No evidence of superficial vein thrombosis in the upper extremity.  Left: No evidence of thrombosis in the subclavian.  *See table(s) above for measurements and observations.  Diagnosing physician: Ruta Hinds MD Electronically signed by Ruta Hinds MD on 09/12/2018 at 3:30:20 PM.    Final    US Abdomen Limited Ruq  Result Date: 09/08/2018 CLINICAL DATA:  78 year old female with abnormal LFTs.  Anasarca. EXAM: ULTRASOUND ABDOMEN LIMITED RIGHT UPPER QUADRANT COMPARISON:  None. FINDINGS: Gallbladder: No gallstones or wall thickening visualized. No sonographic Murphy sign noted by sonographer. Common bile duct: Diameter: 4 millimeters, normal. Liver: Abnormal, highly irregular and heterogeneous liver echogenicity suggesting multiple liver masses ranging from 2.5 centimeters to 10.5 centimeters diameter (images 18, 35, 43). The liver contour seems to remain relatively smooth (image 31). Portal vein is patent on color Doppler imaging with normal direction of blood flow towards the liver. Other findings: No ascites. IMPRESSION: Positive for multiple liver masses without obvious cirrhosis. Favor  metastatic disease over primary liver tumor. CT chest abdomen and  pelvis (oral and IV contrast preferred) recommended. Electronically Signed   By: Genevie Ann M.D.   On: 09/08/2018 02:20     ASSESSMENT/PLAN:  This is a 78 year old female with:  1.  Newly diagnosed adenocarcinoma, unknown primary. Immunohistochemical staining is against colorectal cancer as the primary.  Consideration could be given to cholangiocarcinoma or upper GI malignancy. EGD and colonoscopy performed this morning. Biopsies are pending. Await biopsy results. [facility] has been requested. Case will be presented next week at the GI tumor board. Treatment plan will be finalized pending biopsy results, [facility] results, and discussion at tumor board.  The patient may be discharged from our perspective with outpatient follow-up.  2.  Anasarca: Improving with diuresis. Remains on Lasix and Spironolactone. Anasarca is likely related to her hypoalbuminemia and cancer diagnosis.   LOS: 8 days   Mikey Bussing, DNP, AGPCNP-BC, AOCNP 09/16/18   Addendum  I have seen the patient, examined her. I agree with the assessment and and plan and have edited the notes.   Discussed her endoscopy findings, pending biopsy results, but no high suspicion for upper GI primary. I will call her next week with the biopsy results.  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  Patient is awaiting for SNF placement.  She does not seem to be motivated to participate exercise.  I strongly encouraged her to do physical therapy, gaining strength back, otherwise she would not be a candidate for chemotherapy.  I have sent her biopsy for [facility] One, results will be back in about 3 weeks. She is scheduled to see me on 3/17.  She knows to call me before her next visit.  Truitt Merle  09/16/2018

## 2018-09-16 NOTE — Op Note (Addendum)
[facility] [place] Patient Name: Carrie Chaney Procedure Date: 09/16/2018 MRN: [id number] Attending MD: Otis Brace , MD Date of Birth: 05/30/1941 CSN: [id number] Age: 78 Admit Type: Inpatient Procedure:                Colonoscopy Indications:              Personal history of malignant neoplasm, Abnormal CT                            of the GI tract Providers:                Otis Brace, MD, Jeanella Cara, RN,                            Cherylynn Ridges, Technician, Cathe Mons, CRNA Referring MD:              Medicines:                Sedation Administered by an Anesthesia Professional Complications:            No immediate complications. Estimated Blood Loss:     Estimated blood loss was minimal. Procedure:                Pre-Anesthesia Assessment:                           - Prior to the procedure, a History and Physical                            was performed, and patient medications and                            allergies were reviewed. The patient's tolerance of                            previous anesthesia was also reviewed. The risks                            and benefits of the procedure and the sedation                            options and risks were discussed with the patient.                            All questions were answered, and informed consent                            was obtained. Prior Anticoagulants: The patient has                            taken no previous anticoagulant or antiplatelet                            agents. ASA Grade Assessment: II - A patient with  mild systemic disease. After reviewing the risks                            and benefits, the patient was deemed in                            satisfactory condition to undergo the procedure.                           After obtaining informed consent, the colonoscope                            was passed under direct vision. Throughout  the                            procedure, the patient's blood pressure, pulse, and                            oxygen saturations were monitored continuously. The                            PCF-H190DL ([id number]) Olympus pediatric colonscope                            was introduced through the anus and advanced to the                            the ileocecal valve. The PCF-H190DL ([id number])                            Olympus pediatric colonscope was introduced through                            the anus and advanced to the. The colonoscopy was                            technically difficult and complex due to inadequate                            bowel prep. The patient tolerated the procedure                            well. The quality of the bowel preparation was                            inadequate. Scope in 1116. Cecum time 1128. Scope                            out 1144. Scope In: 11:16:59 AM Scope Out: 11:44:01 AM Total Procedure Duration: 0 hours 27 minutes 2 seconds  Findings:      The perianal and digital rectal examinations were normal.      A large amount of liquid semi-liquid stool was found in the entire  colon, interfering with visualization. Lavage of the area was performed,       resulting in incomplete clearance with fair visualization.      Cecum was not well visualized. 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      A diffuse area of moderately hemorrhagic, inflamed, nodular and       thickened folds of the mucosa was found in the sigmoid colon with one       localized area of inflamed polypoid appearing mucosa. Biopsies were       taken with a cold forceps for histology. No discrete mass seen in the       sigmoid colon allowing for limited visualization.      Multiple large-mouthed diverticula were found in the sigmoid colon and       descending colon.      A localized area of severely thickened folds and  polypoid mucosa with       white nodular discoloration was seen in the distal rectum ,near anal       canal. Biopsies were taken with a cold forceps for histology. Impression:               - Preparation of the colon was inadequate.                           - Stool in the entire examined colon.                           - Hemorrhagic, inflamed, nodular and thickened                            folds of the mucosa in the sigmoid colon. Biopsied.                           - Diverticulosis in the sigmoid colon and in the                            descending colon.                           - Thickened folds of the mucosa in the distal                            rectum. Biopsied. Moderate Sedation:      Moderate (conscious) sedation was personally administered by an       anesthesia professional. The following parameters were monitored: oxygen       saturation, heart rate, blood pressure, and response to care. Recommendation:           - Return patient to hospital ward for ongoing care.                           - Await pathology results.                           - Resume previous diet.                           -  Continue present medications. Procedure Code(s):        --- Professional ---                           [phone], Colonoscopy, flexible; with biopsy, single                            or multiple Diagnosis Code(s):        --- Professional ---                           K92.2, Gastrointestinal hemorrhage, unspecified                           K52.9, Noninfective gastroenteritis and colitis,                            unspecified                           K63.89, Other specified diseases of intestine                           K62.89, Other specified diseases of anus and rectum                           Z85.9, Personal history of malignant neoplasm,                            unspecified                           K57.30, Diverticulosis of large intestine without                             perforation or abscess without bleeding                           R93.3, Abnormal findings on diagnostic imaging of                            other parts of digestive tract CPT copyright 2018 [employer]. All rights reserved. The codes documented in this report are preliminary and upon coder review may  be revised to meet current compliance requirements. Otis Brace, MD Otis Brace, MD 09/16/2018 12:02:28 PM Number of Addenda: 0

## 2018-09-16 NOTE — Anesthesia Postprocedure Evaluation (Signed)
Anesthesia Post Note  Patient: Ramani Gainey  Procedure(s) Performed: ESOPHAGOGASTRODUODENOSCOPY (EGD) WITH PROPOFOL (N/A ) COLONOSCOPY WITH PROPOFOL (N/A ) BIOPSY     Patient location during evaluation: PACU Anesthesia Type: MAC Level of consciousness: awake and alert Pain management: pain level controlled Vital Signs Assessment: post-procedure vital signs reviewed and stable Respiratory status: spontaneous breathing, nonlabored ventilation, respiratory function stable and patient connected to nasal cannula oxygen Cardiovascular status: stable and blood pressure returned to baseline Postop Assessment: no apparent nausea or vomiting Anesthetic complications: no    Last Vitals:  Vitals:   09/16/18 1206 09/16/18 1215  BP: (!) 129/48   Pulse: 73   Resp: (!) 26   Temp:    SpO2: 96% 92%    Last Pain:  Vitals:   09/16/18 1155  TempSrc: Oral  PainSc: 0-No pain                 Effie Berkshire

## 2018-09-16 NOTE — Op Note (Signed)
[facility] Patient Name: Carrie Chaney Procedure Date: 09/16/2018 MRN: [id number] Attending MD: Otis Brace , MD Date of Birth: [birth year] CSN: [id number] Age: 78 Admit Type: Inpatient Procedure:                Upper GI endoscopy Indications:              Personal history of malignant neoplasm Providers:                Otis Brace, MD, Jeanella Cara, RN,                            Cherylynn Ridges, Technician, Cathe Mons, CRNA Referring MD:              Medicines:                Sedation Administered by an Anesthesia Professional Complications:            No immediate complications. Estimated Blood Loss:     Estimated blood loss was minimal. Procedure:                Pre-Anesthesia Assessment:                           - Prior to the procedure, a History and Physical                            was performed, and patient medications and                            allergies were reviewed. The patient's tolerance of                            previous anesthesia was also reviewed. The risks                            and benefits of the procedure and the sedation                            options and risks were discussed with the patient.                            All questions were answered, and informed consent                            was obtained. Prior Anticoagulants: The patient has                            taken no previous anticoagulant or antiplatelet                            agents. ASA Grade Assessment: II - A patient with                            mild systemic disease. After reviewing the risks  and benefits, the patient was deemed in                            satisfactory condition to undergo the procedure.                           After obtaining informed consent, the endoscope was                            passed under direct vision. Throughout the                            procedure, the  patient's blood pressure, pulse, and                            oxygen saturations were monitored continuously. The                            GIF-H190 ([id number]) Olympus gastroscope was                            introduced through the mouth, and advanced to the                            second part of duodenum. The upper GI endoscopy was                            accomplished without difficulty. The patient                            tolerated the procedure well. Scope In: Scope Out: Findings:      Diffuse, white plaques were found in the entire esophagus. Biopsies were       taken with a cold forceps for histology.      The Z-line was regular and was found 38 cm from the incisors.      Multiple dispersed, small non-bleeding erosions were found in the       gastric body, in the gastric antrum and in the prepyloric region of the       stomach. There were stigmata of recent bleeding. Biopsies were taken       with a cold forceps for histology.      The cardia and gastric fundus were normal on retroflexion.      Localized nodular mucosa was found in the duodenal bulb. Biopsies were       taken with a cold forceps for histology.      The second portion of the duodenum was normal. Impression:               - Esophageal plaques were found, consistent with                            candidiasis. Biopsied.                           - Z-line regular, 38 cm from the incisors.                           -  Non-bleeding erosive gastropathy. Biopsied.                           - Nodular mucosa in the duodenal bulb. Biopsied.                           - Normal second portion of the duodenum. Moderate Sedation:      Moderate (conscious) sedation was personally administered by an       anesthesia professional. The following parameters were monitored: oxygen       saturation, heart rate, blood pressure, and response to care. Recommendation:           - Perform a colonoscopy today. Procedure Code(s):         --- Professional ---                           [id number], Esophagogastroduodenoscopy, flexible,                            transoral; with biopsy, single or multiple Diagnosis Code(s):        --- Professional ---                           K22.9, Disease of esophagus, unspecified                           K31.89, Other diseases of stomach and duodenum                           Z85.9, Personal history of malignant neoplasm,                            unspecified CPT copyright 2018 [employer]. All rights reserved. The codes documented in this report are preliminary and upon coder review may  be revised to meet current compliance requirements. Otis Brace, MD Otis Brace, MD 09/16/2018 11:50:15 AM Number of Addenda: 0

## 2018-09-16 NOTE — Progress Notes (Signed)
Foley catheter was taken off as MD ordered at 1449.

## 2018-09-16 NOTE — Progress Notes (Signed)
Pt asked RN to go over PM meds. After going over the list for this evening, pt stated she will only take her hydralazine and she does not need other meds. RN discussed the importance of the K-Dur and the new order for nystatin ask well as the mylicon chew, but the pt stated that "you don't understand why I don't need those-- these spots in my throat are from the powder of the potassium pills and mylicon chews" and proceeded to refuse other PM meds.

## 2018-09-16 NOTE — Anesthesia Preprocedure Evaluation (Signed)
Anesthesia Evaluation  Patient identified by MRN, date of birth, ID band Patient awake    Reviewed: Allergy & Precautions, NPO status , Patient's Chart, lab work & pertinent test results  Airway Mallampati: II  TM Distance: >3 FB Neck ROM: Full    Dental  (+) Upper Dentures, Partial Lower   Pulmonary former smoker,    breath sounds clear to auscultation       Cardiovascular hypertension,  Rhythm:Regular Rate:Normal     Neuro/Psych negative neurological ROS     GI/Hepatic negative GI ROS, Neg liver ROS,   Endo/Other  diabetes, Type 2  Renal/GU Renal InsufficiencyRenal disease     Musculoskeletal negative musculoskeletal ROS (+)   Abdominal (+) + obese,   Peds  Hematology negative hematology ROS (+)   Anesthesia Other Findings   Reproductive/Obstetrics                             Echo: 1. The left ventricle has hyperdynamic systolic function, with an ejection fraction of >65%. The cavity size was normal. There is moderately increased left ventricular wall thickness. Left ventricular diastolic Doppler parameters are consistent with  impaired relaxation.  2. The right ventricle has normal systolic function. The cavity was mildly enlarged. There is no increase in right ventricular wall thickness.  3. The mitral valve is normal in structure.  4. The tricuspid valve is normal in structure.  5. The aortic valve is normal in structure.  6. The pulmonic valve was normal in structure.  Anesthesia Physical Anesthesia Plan  ASA: II  Anesthesia Plan: MAC   Post-op Pain Management:    Induction:   PONV Risk Score and Plan: 0 and Propofol infusion  Airway Management Planned: Simple Face Mask and Nasal Cannula  Additional Equipment: None  Intra-op Plan:   Post-operative Plan:   Informed Consent: I have reviewed the patients History and Physical, chart, labs and discussed the procedure  including the risks, benefits and alternatives for the proposed anesthesia with the patient or authorized representative who has indicated his/her understanding and acceptance.       Plan Discussed with:   Anesthesia Plan Comments:         Anesthesia Quick Evaluation

## 2018-09-16 NOTE — Progress Notes (Signed)
[facility] Progress Note  Carrie Chaney 78 y.o. 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  Complaining of abdominal bloating.  Claiming to have clear bowel movements.     Objective: Vital signs in last 24 hours: Vitals:   09/15/18 1950 09/16/18 0608  BP: (!) 133/44 (!) 136/46  Pulse: 81 74  Resp: 19 17  Temp: 98.7 F (37.1 C) 98 F (36.7 C)  SpO2: 93% 94%    Physical Exam:  General.  Alert/oriented x3.  Not in acute distress.  Resting in the recliner. Abdomen.  Distended, nontender, bowel sounds present.  No peritoneal signs  Lab Results: Recent Labs    09/15/18 0558 09/16/18 0558  NA 146* 144  K 3.4* 4.4  CL 106 107  CO2 30 28  GLUCOSE 167* 145*  BUN 55* 57*  CREATININE 1.30* 1.30*  CALCIUM 8.0* 7.9*   No results for input(s): AST, ALT, ALKPHOS, BILITOT, PROT, ALBUMIN in the last 72 hours. No results for input(s): WBC, NEUTROABS, HGB, HCT, MCV, PLT in the last 72 hours. No results for input(s): LABPROT, INR in the last 72 hours.    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  Possible primary as cholangiocarcinoma versus upper GI. -Anasarca. - Abnormal LFts.   Recommendations ------------------------ -  EGD and colonoscopy today. Patient only drank one third of her Pap   Risks (bleeding, infection, bowel perforation that could require surgery, sedation-related changes in cardiopulmonary systems), benefits (identification and possible treatment of source of symptoms, exclusion of certain causes of symptoms), and alternatives (watchful waiting, radiographic imaging studies, empiric medical treatment)  were explained to patient/family in detail and patient wishes to proceed.   Otis Brace MD, [place] 09/16/2018, 9:15 AM  Contact #   [phone]

## 2018-09-16 NOTE — Progress Notes (Signed)
PROGRESS NOTE    Carrie Chaney  XQJ:[id number] DOB: 1940/10/17 DOA: 09/07/2018 PCP: Lucianne Lei, MD   Brief Narrative: Patient is a 78 year old female with history of hypertension, hyperlipidemia who presented with 4 weeks history of progressive lower extremity swelling which continues to worsen despite being started on Bumex.  Chest ray showed cardiomegaly, pulmonary congestion.  Albumin was found to be low.  She was admitted for anasarca and was started on IV diuretics.  She had mild elevated liver enzymes and work-up which included right upper quadrant ultrasound which showed multiple mass in the liver.  Subsequent chest CT/abdomen/pelvis demonstrated bulky metastasis in the right liver and right kidney with omental and nodal metastasis in the abdomen.  Liver biopsy revealed adenocarcinoma of unknown primary.  Oncology was consulted.  She underwent EGD/colonoscopy on 09/16/18. Cardiology was following.  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  Chest x-ray showed  pulmonary edema.  She had severe bilateral lower extremity edema.  Hypoalbuminemia and diastolic dysfunction are suspected to be primary contributors.  Lasix changed to oral 80 mg twice a day.  Cardiology was following.  We will continue monitor input and output.  Foley inserted today because of retention.We will remove foley today. Continue TED hose for bilateral lower extremity edema.  Metastatic adenocarcinoma of unknown primary: Bulky metastasis disease in the liver, extension to the right kidney superior pole, involvement of perigastric, porta hepatis and omentum.  Elevated CEA.  Oncology was following.  CT also showed sigmoid colonic mass 4  cm. Underwent  EGD and colonoscopy today.  Findings include esophageal plaques consistent with candidiasis, nonbleeding erosive gastropathy. Colonoscopy revealed inadequate bowel preparation. Biopsy of the hemorrhagic, inflamed, nodular and thickened folds of mucosa in the sigmoid colon was done.  Will follow biopsy report.   Acute kidney injury: Foley inserted yesterday with 1000 cc on bladder residual check.Will try to remove Foley today.  Leukocytosis: Improving.  Right upper extremity swelling: Improved with elevation.  No DVT or SVT on ultrasound.  Abdominal pain: Most likely associated with distention with bulky metastatic disease.  Continue supportive care.  Pain management  Hypertension: Continue Norvasc.  Continue to monitor blood pressure.  Hypokalemia: Supplemented  Hyperlipidemia: Continue statin  Proximal muscle weakness: CPK, TSH normal.  PT/OT recommended skilled nursing facility.  Social worker consulted and working for [employer].  Patient is hemodynamically stable for discharge to skilled nursing facility soon as the bed is available.   Nutrition Problem: Inadequate oral intake Etiology: acute illness      DVT prophylaxis: Lovenox Code Status: Full Family Communication: None present at the bedside Disposition Plan: Skilled nursing facility as soon as the bed is available   Consultants: Cardiology, GI, oncology  Procedures: None  Antimicrobials:  Anti-infectives (From admission, onward)   None      Subjective: Patient seen and examined the bedside this morning.  Remains comfortable.  Hemodynamically stable.  Still has significant bilateral lower extremity edema.  She has no specific complaints.  Objective: Vitals:   09/16/18 1155 09/16/18 1201 09/16/18 1206 09/16/18 1215  BP: (!) 131/30 (!) 127/25 (!) 129/48   Pulse: 76 75 73   Resp: (!) 22 (!) 28 (!) 26   Temp: 98 F (36.7 C)  TempSrc: Oral     SpO2: 97% 97% 96% 92%  Weight:      Height:         Intake/Output Summary (Last 24 hours) at 09/16/2018 1224 Last data filed at 09/16/2018 1148 Gross per 24 hour  Intake 799.96 ml  Output 950 ml  Net -150.04 ml   Filed Weights   09/14/18 0500 09/16/18 0608 09/16/18 0954  Weight: 115.3 kg 112.6 kg 112.6 kg    Examination:  General exam: Morbid obesity, generalized weakness  HEENT:PERRL,Oral mucosa moist, Ear/Nose normal on gross exam Respiratory system: Bilateral equal air entry, normal vesicular breath sounds, no wheezes or crackles  Cardiovascular system: S1 & S2 heard, RRR. No JVD, murmurs, rubs, gallops or clicks. Gastrointestinal system: Abdomen is nondistended, soft and nontender. No organomegaly or masses felt. Normal bowel sounds heard. Central nervous system: Alert and oriented. No focal neurological deficits. Extremities: 2-3+ bilateral lower extremity edema, no clubbing ,no cyanosis, distal peripheral pulses palpable. Skin: No rashes, lesions or ulcers,no icterus ,no pallor   Data Reviewed: I have personally reviewed following labs and imaging studies  CBC: Recent Labs  Lab 09/10/18 0543 09/11/18 0541 09/13/18 0648  WBC 16.7* 17.4* 12.3*  HGB 11.6* 11.4* 11.2*  HCT 37.8 37.6 37.3  MCV 93.1 93.5 94.4  PLT 315 329 409   Basic Metabolic Panel: Recent Labs  Lab 09/10/18 0543  09/12/18 0707 09/13/18 0648 09/14/18 0649 09/15/18 0558 09/16/18 0558  NA 145   < > 144 144 146* 146* 144  K 3.9   < > 3.6 5.1 2.8* 3.4* 4.4  CL 106   < > 104 105 107 106 107  CO2 31   < > 29 29 28 30 28   GLUCOSE 152*   < > 134* 142* 162* 167* 145*  BUN 29*   < > 36* 39* 43* 55* 57*  CREATININE 1.15*   < > 1.02* 1.01* 1.02* 1.30* 1.30*  CALCIUM 8.1*   < > 8.2* 8.1* 8.1* 8.0* 7.9*  MG 2.1  --  2.0  --   --   --   --    < > = values in this interval not displayed.   GFR: Estimated Creatinine Clearance: 47 mL/min (A) (by C-G formula based on SCr of 1.3 mg/dL (H)). Liver Function Tests: Recent Labs  Lab 09/12/18 0707  AST 43*   ALT 47*  ALKPHOS 238*  BILITOT 1.1  PROT 5.6*  ALBUMIN 2.1*   No results for input(s): LIPASE, AMYLASE in the last 168 hours. No results for input(s): AMMONIA in the last 168 hours. Coagulation Profile: No results for input(s): INR, PROTIME in the last 168 hours. Cardiac Enzymes: No results for input(s): CKTOTAL, CKMB, CKMBINDEX, TROPONINI in the last 168 hours. BNP (last 3 results) No results for input(s): PROBNP in the last 8760 hours. HbA1C: No results for input(s): HGBA1C in the last 72 hours. CBG: No results for input(s): GLUCAP in the last 168 hours. Lipid Profile: No results for input(s): CHOL, HDL, LDLCALC, TRIG, CHOLHDL, LDLDIRECT in the last 72 hours. Thyroid Function Tests: No results for input(s): TSH, T4TOTAL, FREET4, T3FREE, THYROIDAB in the last 72 hours. Anemia Panel: No results for input(s): VITAMINB12, FOLATE, FERRITIN, TIBC, IRON, RETICCTPCT in the last 72 hours. Sepsis Labs: No results for input(s): PROCALCITON, LATICACIDVEN in the last 168 hours.  Recent Results (from the past 240 hour(s))  Surgical PCR screen     Status: None   Collection Time: 09/14/18  4:46 PM  Result  Value Ref Range Status   MRSA, PCR NEGATIVE NEGATIVE Final   Staphylococcus aureus NEGATIVE NEGATIVE Final    Comment: (NOTE) The Xpert SA Assay (FDA approved for NASAL specimens in patients 97 years of age and older), is one component of a comprehensive surveillance program. It is not intended to diagnose infection nor to guide or monitor treatment. Performed at [facility], [place] [address], [place], [place] [address]          Radiology Studies: No results found.      Scheduled Meds: . amLODipine  5 mg Oral Daily  . enoxaparin (LOVENOX) injection  40 mg Subcutaneous Daily  . feeding supplement (PRO-STAT SUGAR FREE 64)  30 mL Oral Daily  . furosemide  80 mg Oral BID  . hydrALAZINE  50 mg Oral Q8H  . multivitamin with minerals  1 tablet Oral Daily   . nystatin  5 mL Oral QID  . potassium chloride  40 mEq Oral BID  . simethicone  80 mg Oral QID  . sodium chloride flush  3 mL Intravenous Q12H  . spironolactone  25 mg Oral Daily   Continuous Infusions: . sodium chloride Stopped (09/09/18 1241)     LOS: 8 days    Time spent: 35 mins.More than 50% of that time was spent in counseling and/or coordination of care.      Shelly Coss, MD [facility] Pager [phone]  If 7PM-7AM, please contact night-coverage [email or web address] Password [facility] 09/16/2018, 12:24 PM

## 2018-09-17 LAB — CBC WITH DIFFERENTIAL/PLATELET
Abs Immature Granulocytes: 0.11 10*3/uL — ABNORMAL HIGH (ref 0.00–0.07)
Basophils Absolute: 0 10*3/uL (ref 0.0–0.1)
Basophils Relative: 0 %
EOS PCT: 0 %
Eosinophils Absolute: 0 10*3/uL (ref 0.0–0.5)
HEMATOCRIT: 37.2 % (ref 36.0–46.0)
Hemoglobin: 10.9 g/dL — ABNORMAL LOW (ref 12.0–15.0)
Immature Granulocytes: 1 %
Lymphocytes Relative: 4 %
Lymphs Abs: 0.6 10*3/uL — ABNORMAL LOW (ref 0.7–4.0)
MCH: 27.8 pg (ref 26.0–34.0)
MCHC: 29.3 g/dL — ABNORMAL LOW (ref 30.0–36.0)
MCV: 94.9 fL (ref 80.0–100.0)
Monocytes Absolute: 0.6 10*3/uL (ref 0.1–1.0)
Monocytes Relative: 4 %
Neutro Abs: 12.7 10*3/uL — ABNORMAL HIGH (ref 1.7–7.7)
Neutrophils Relative %: 91 %
Platelets: 371 10*3/uL (ref 150–400)
RBC: 3.92 MIL/uL (ref 3.87–5.11)
RDW: 18.1 % — AB (ref 11.5–15.5)
WBC: 13.9 10*3/uL — ABNORMAL HIGH (ref 4.0–10.5)
nRBC: 2.7 % — ABNORMAL HIGH (ref 0.0–0.2)

## 2018-09-17 LAB — BASIC METABOLIC PANEL
Anion gap: 11 (ref 5–15)
BUN: 52 mg/dL — ABNORMAL HIGH (ref 8–23)
CO2: 26 mmol/L (ref 22–32)
Calcium: 8 mg/dL — ABNORMAL LOW (ref 8.9–10.3)
Chloride: 109 mmol/L (ref 98–111)
Creatinine, Ser: 1.27 mg/dL — ABNORMAL HIGH (ref 0.44–1.00)
GFR calc Af Amer: 47 mL/min — ABNORMAL LOW (ref >60)
GFR calc non Af Amer: 40 mL/min — ABNORMAL LOW (ref >60)
Glucose, Bld: 131 mg/dL — ABNORMAL HIGH (ref 70–99)
Potassium: 3.5 mmol/L (ref 3.5–5.1)
Sodium: 146 mmol/L — ABNORMAL HIGH (ref 135–145)

## 2018-09-17 NOTE — Progress Notes (Addendum)
Progress Note  Patient Name: Carrie Chaney Date of Encounter: 09/17/2018  Primary Cardiologist: Dorris Carnes, MD   Subjective    Denies any chest pain or SOB.  She continues to diurese well on PO Lasix.  She put out 1L yesterday and is net neg 10.4L.    Inpatient Medications    Scheduled Meds: . amLODipine  5 mg Oral Daily  . enoxaparin (LOVENOX) injection  40 mg Subcutaneous Daily  . feeding supplement (PRO-STAT SUGAR FREE 64)  30 mL Oral Daily  . furosemide  80 mg Oral BID  . hydrALAZINE  50 mg Oral Q8H  . multivitamin with minerals  1 tablet Oral Daily  . nystatin  5 mL Oral QID  . potassium chloride  40 mEq Oral BID  . simethicone  80 mg Oral QID  . sodium chloride flush  3 mL Intravenous Q12H  . spironolactone  25 mg Oral Daily   Continuous Infusions: . sodium chloride Stopped (09/09/18 1241)   PRN Meds: sodium chloride, acetaminophen, ondansetron (ZOFRAN) IV, oxyCODONE, sodium chloride flush   Vital Signs    Vitals:   09/16/18 1215 09/16/18 2011 09/17/18 0500 09/17/18 0544  BP:  (!) 135/49  (!) 140/51  Pulse:  80  74  Resp:  16  16  Temp:  98.9 F (37.2 C)  98 F (36.7 C)  TempSrc:    Oral  SpO2: 92% 93%  93%  Weight:   114.3 kg   Height:        Intake/Output Summary (Last 24 hours) at 09/17/2018 0454 Last data filed at 09/17/2018 0500 Gross per 24 hour  Intake 503 ml  Output 1050 ml  Net -547 ml   Last 3 Weights 09/17/2018 09/16/2018 09/16/2018  Weight (lbs) 251 lb 15.8 oz 248 lb 3.8 oz 248 lb 3.8 oz  Weight (kg) 114.3 kg 112.6 kg 112.6 kg      Telemetry    NSR  - Personally Reviewed  ECG    No new EKG to review - Personally Reviewed  Physical Exam   GEN: Well nourished, well developed in no acute distress HEENT: Normal NECK: No JVD; No carotid bruits LYMPHATICS: No lymphadenopathy CARDIAC:RRR, no murmurs, rubs, gallops RESPIRATORY:  Clear to auscultation without rales, wheezing or rhonchi  ABDOMEN: Soft, non-tender,  non-distended MUSCULOSKELETAL:  1+ edema; No deformity  SKIN: Warm and dry NEUROLOGIC:  Alert and oriented x 3 PSYCHIATRIC:  Normal affect    Labs    Chemistry Recent Labs  Lab 09/12/18 0707  09/15/18 0558 09/16/18 0558 09/17/18 0540  NA 144   < > 146* 144 146*  K 3.6   < > 3.4* 4.4 3.5  CL 104   < > 106 107 109  CO2 29   < > 30 28 26   GLUCOSE 134*   < > 167* 145* 131*  BUN 36*   < > 55* 57* 52*  CREATININE 1.02*   < > 1.30* 1.30* 1.27*  CALCIUM 8.2*   < > 8.0* 7.9* 8.0*  PROT 5.6*  --   --   --   --   ALBUMIN 2.1*  --   --   --   --   AST 43*  --   --   --   --   ALT 47*  --   --   --   --   ALKPHOS 238*  --   --   --   --   BILITOT 1.1  --   --   --   --  GFRNONAA 53*   < > 39* 39* 40*  GFRAA >60   < > 46* 46* 47*  ANIONGAP 11   < > 10 9 11    < > = values in this interval not displayed.     Hematology Recent Labs  Lab 09/11/18 0541 09/13/18 0648 09/17/18 0540  WBC 17.4* 12.3* 13.9*  RBC 4.02 3.95 3.92  HGB 11.4* 11.2* 10.9*  HCT 37.6 37.3 37.2  MCV 93.5 94.4 94.9  MCH 28.4 28.4 27.8  MCHC 30.3 30.0 29.3*  RDW 17.3* 17.6* 18.1*  PLT 329 346 371    Cardiac EnzymesNo results for input(s): TROPONINI in the last 168 hours. No results for input(s): TROPIPOC in the last 168 hours.   BNPNo results for input(s): BNP, PROBNP in the last 168 hours.   DDimer No results for input(s): DDIMER in the last 168 hours.   Radiology    No results found.  Cardiac Studies   Echo 09/08/2018 IMPRESSIONS  1. The left ventricle has hyperdynamic systolic function, with an ejection fraction of >65%. The cavity size was normal. There is moderately increased left ventricular wall thickness. Left ventricular diastolic Doppler parameters are consistent with  impaired relaxation. 2. The right ventricle has normal systolic function. The cavity was mildly enlarged. There is no increase in right ventricular wall thickness. 3. The mitral valve is normal in structure. 4. The  tricuspid valve is normal in structure. 5. The aortic valve is normal in structure. 6. The pulmonic valve was normal in structure.  Patient Profile     78 y.o. female with PMH of metastatic CA/liver lesions, HTN, HLD and low albumin presented with generalized edema. Undergoing diuresis.   Assessment & Plan    Generalized edema/anasarca -Probably related to metastatic cancer and hypoalbuminemia -Edema is much improved, still has some lower extremity edema. -she put out 1L yesterday on PO Lasix and is net neg 10.5L.  -weight up 1.5 lbs from yesterday   -creatinine stable at 1.27 (1.3 yesterday) -Continue current diuresis.  -compression hose for LE edema  Hypokalemia -K+ was 3.5 yesterday, on potassium supplementation  -per [facility]  Metastatic adenocarcinoma of unknown primary -With anasarca probably related to cancer and hypoalbuminemia.  -Oncology is following   Hypertension -BP controlled at 140/50mmHg  -continue  hydralazine 50 mg TID, Amlodipine 5mg  daily  Hyperlipidemia -On lipitor at home.   PSVT -resolved.   [facility] will sign off.   Medication Recommendations:  Hydralazine 50mg  TID, amlodipine 5mg  daily, Lasix 80mg  BID, spiro 25mg  daily and daily K+ suppl Other recommendations (labs, testing, etc):  none Follow up as an outpatient:  Dr. Harrington Challenger in 2-3 weeks  For questions or updates, please contact [place] Please consult [email or web address] for contact info under   Signed, Fransico Him, MD  09/17/2018, 8:12 AM

## 2018-09-17 NOTE — Progress Notes (Signed)
Carrie Chaney 11:47 AM  Subjective: Patient seen and examined and her case discussed with my partner Dr. B and her hospital computer chart was reviewed and she is eating fine and moves her bowels with some medicine and is looking forward to physical therapy main complaint is she cannot walk and she had no problems with her procedures yesterday  Objective: Vital signs stable afebrile no acute distress abdomen is soft nontender normal bowel sounds labs stable  Assessment: Metastatic cancer questionable primary  Plan: Await colon biopsies oncology to follow-up please call me this weekend if I can be of any further assistance  [facility]  Pager [phone] After 5PM or if no answer call [phone]

## 2018-09-17 NOTE — Progress Notes (Signed)
PROGRESS NOTE    Carrie Chaney  URK:[id number] DOB: August 26, 1940 DOA: 09/07/2018 PCP: Lucianne Lei, MD   Brief Narrative: Patient is a 78 year old female with history of hypertension, hyperlipidemia who presented with 4 weeks history of progressive lower extremity swelling which continues to worsen despite being started on Bumex.  Chest ray showed cardiomegaly, pulmonary congestion.  Albumin was found to be low.  She was admitted for anasarca and was started on IV diuretics.  She had mild elevated liver enzymes and work-up which included right upper quadrant ultrasound which showed multiple mass in the liver.  Subsequent chest CT/abdomen/pelvis demonstrated bulky metastasis in the right liver and right kidney with omental and nodal metastasis in the abdomen.  Liver biopsy revealed adenocarcinoma of unknown primary.  Oncology was consulted.  She underwent EGD/colonoscopy on 09/16/18. Cardiology was following. Patient is medically stable for discharge to skilled nursing facility as soon as the bed is available.  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  Chest x-ray showed  pulmonary edema.  She had severe bilateral lower extremity edema.  Hypoalbuminemia and diastolic dysfunction are suspected to be primary contributors.  Lasix changed to oral 80 mg twice a day.  Also on spironolactone.  Cardiology was following.  Continue TED hose for bilateral lower extremity edema.  Metastatic adenocarcinoma of unknown primary: Bulky metastasis disease in the liver, extension to the right kidney superior pole, involvement of perigastric, porta hepatis and omentum.  Elevated CEA. Liver biopsy confirmed adenocarcinoma.  Oncology was  following.  CT also showed sigmoid colonic mass 4 cm. Underwent  EGD and colonoscopy today.  Findings include esophageal plaques consistent with candidiasis, nonbleeding erosive gastropathy. Colonoscopy revealed inadequate bowel preparation. Biopsy of the hemorrhagic, inflamed, nodular and thickened folds of mucosa in the sigmoid colon was done.  Will follow biopsy report. She already has an appointment with oncology.  Acute kidney injury: Kidney function is stable at present.  Leukocytosis: Improving.  Right upper extremity swelling: Improved with elevation.  No DVT or SVT on ultrasound.  Abdominal pain: Improved.  Most likely associated with distention with bulky metastatic disease.  Continue supportive care.    Hypertension: Continue Norvasc.  Continue to monitor blood pressure.  Hypokalemia: Being supplemented  Hyperlipidemia: Continue statin  Proximal muscle weakness/deconditioning/debility: CPK, TSH normal.  PT/OT recommended skilled nursing facility.  Social worker consulted and working for [employer].  Patient is hemodynamically stable for discharge to skilled nursing facility as soon as the bed is available.   Nutrition Problem: Inadequate oral intake Etiology: acute illness      DVT prophylaxis: Lovenox Code Status: Full Family Communication: None present at the bedside Disposition Plan: Skilled nursing facility as soon as the bed is available   Consultants: Cardiology, GI, oncology  Procedures: None  Antimicrobials:  Anti-infectives (From admission, onward)   None      Subjective: Patient seen and examined the bedside this morning.  Remains comfortable.  Hemodynamically stable.  No new complaints.  Objective: Vitals:   09/16/18 1215 09/16/18 2011 09/17/18 0500 09/17/18 0544  BP:  (!) 135/49  (!) 140/51  Pulse:  80  74  Resp:  16  16  Temp:  98.9 F (37.2 C)  98 F (36.7 C)  TempSrc:  Oral  SpO2: 92% 93%  93%  Weight:   114.3 kg   Height:         Intake/Output Summary (Last 24 hours) at 09/17/2018 1106 Last data filed at 09/17/2018 0500 Gross per 24 hour  Intake 503 ml  Output 1050 ml  Net -547 ml   Filed Weights   09/16/18 0608 09/16/18 0954 09/17/18 0500  Weight: 112.6 kg 112.6 kg 114.3 kg    Examination:  General exam: Morbid obesity, looks comfortable.  Not in distress  HEENT:PERRL,Oral mucosa moist, Ear/Nose normal on gross exam Respiratory system: Bilateral equal air entry, normal vesicular breath sounds, no wheezes or crackles  Cardiovascular system: S1 & S2 heard, RRR. No JVD, murmurs, rubs, gallops or clicks. Gastrointestinal system: Abdomen is mildly distended, soft and nontender. No organomegaly or masses felt. Normal bowel sounds heard. Central nervous system: Alert and oriented. No focal neurological deficits. Extremities: 1-2+ lower extremity edema, no clubbing ,no cyanosis, distal peripheral pulses palpable. Skin: No rashes, lesions or ulcers,no icterus ,no pallor    Data Reviewed: I have personally reviewed following labs and imaging studies  CBC: Recent Labs  Lab 09/11/18 0541 09/13/18 0648 09/17/18 0540  WBC 17.4* 12.3* 13.9*  NEUTROABS  --   --  12.7*  HGB 11.4* 11.2* 10.9*  HCT 37.6 37.3 37.2  MCV 93.5 94.4 94.9  PLT 329 346 235   Basic Metabolic Panel: Recent Labs  Lab 09/12/18 0707 09/13/18 0648 09/14/18 0649 09/15/18 0558 09/16/18 0558 09/17/18 0540  NA 144 144 146* 146* 144 146*  K 3.6 5.1 2.8* 3.4* 4.4 3.5  CL 104 105 107 106 107 109  CO2 29 29 28 30 28 26   GLUCOSE 134* 142* 162* 167* 145* 131*  BUN 36* 39* 43* 55* 57* 52*  CREATININE 1.02* 1.01* 1.02* 1.30* 1.30* 1.27*  CALCIUM 8.2* 8.1* 8.1* 8.0* 7.9* 8.0*  MG 2.0  --   --   --   --   --    GFR: Estimated Creatinine Clearance: 48.5 mL/min (A) (by C-G formula based on SCr of 1.27 mg/dL (H)). Liver Function Tests: Recent Labs  Lab 09/12/18 0707  AST 43*  ALT 47*  ALKPHOS 238*  BILITOT 1.1  PROT 5.6*  ALBUMIN  2.1*   No results for input(s): LIPASE, AMYLASE in the last 168 hours. No results for input(s): AMMONIA in the last 168 hours. Coagulation Profile: No results for input(s): INR, PROTIME in the last 168 hours. Cardiac Enzymes: No results for input(s): CKTOTAL, CKMB, CKMBINDEX, TROPONINI in the last 168 hours. BNP (last 3 results) No results for input(s): PROBNP in the last 8760 hours. HbA1C: No results for input(s): HGBA1C in the last 72 hours. CBG: No results for input(s): GLUCAP in the last 168 hours. Lipid Profile: No results for input(s): CHOL, HDL, LDLCALC, TRIG, CHOLHDL, LDLDIRECT in the last 72 hours. Thyroid Function Tests: No results for input(s): TSH, T4TOTAL, FREET4, T3FREE, THYROIDAB in the last 72 hours. Anemia Panel: No results for input(s): VITAMINB12, FOLATE, FERRITIN, TIBC, IRON, RETICCTPCT in the last 72 hours. Sepsis Labs: No results for input(s): PROCALCITON, LATICACIDVEN in the last 168 hours.  Recent Results (from the past 240 hour(s))  Surgical PCR screen     Status: None   Collection Time: 09/14/18  4:46 PM  Result Value Ref Range Status   MRSA, PCR NEGATIVE NEGATIVE Final   Staphylococcus aureus NEGATIVE NEGATIVE Final    Comment: (NOTE) The Xpert SA Assay (FDA approved for NASAL specimens in patients  52 years of age and older), is one component of a comprehensive surveillance program. It is not intended to diagnose infection nor to guide or monitor treatment. Performed at [facility], [place] [address], [place], [place] [address]          Radiology Studies: No results found.      Scheduled Meds: . amLODipine  5 mg Oral Daily  . enoxaparin (LOVENOX) injection  40 mg Subcutaneous Daily  . feeding supplement (PRO-STAT SUGAR FREE 64)  30 mL Oral Daily  . furosemide  80 mg Oral BID  . hydrALAZINE  50 mg Oral Q8H  . multivitamin with minerals  1 tablet Oral Daily  . nystatin  5 mL Oral QID  . potassium chloride  40 mEq Oral  BID  . simethicone  80 mg Oral QID  . sodium chloride flush  3 mL Intravenous Q12H  . spironolactone  25 mg Oral Daily   Continuous Infusions: . sodium chloride Stopped (09/09/18 1241)     LOS: 9 days    Time spent: 35 mins.More than 50% of that time was spent in counseling and/or coordination of care.      Shelly Coss, MD [facility] Pager [phone]  If 7PM-7AM, please contact night-coverage [email or web address] Password [email or web address] 09/17/2018, 11:06 AM

## 2018-09-18 LAB — BASIC METABOLIC PANEL
Anion gap: 8 (ref 5–15)
BUN: 56 mg/dL — AB (ref 8–23)
CO2: 28 mmol/L (ref 22–32)
Calcium: 7.9 mg/dL — ABNORMAL LOW (ref 8.9–10.3)
Chloride: 108 mmol/L (ref 98–111)
Creatinine, Ser: 1.31 mg/dL — ABNORMAL HIGH (ref 0.44–1.00)
GFR calc Af Amer: 45 mL/min — ABNORMAL LOW (ref >60)
GFR calc non Af Amer: 39 mL/min — ABNORMAL LOW (ref >60)
Glucose, Bld: 149 mg/dL — ABNORMAL HIGH (ref 70–99)
Potassium: 3.8 mmol/L (ref 3.5–5.1)
Sodium: 144 mmol/L (ref 135–145)

## 2018-09-18 MED ORDER — FUROSEMIDE 40 MG PO TABS
60.0000 mg | ORAL_TABLET | Freq: Two times a day (BID) | ORAL | Status: DC
  Administered 2018-09-18 – 2018-09-20 (×5): 60 mg via ORAL
  Filled 2018-09-18 (×5): qty 1

## 2018-09-18 NOTE — Progress Notes (Addendum)
PROGRESS NOTE    Carrie Chaney  CWC:[id number] DOB: [birth year] DOA: 09/07/2018 PCP: Lucianne Lei, MD   Brief Narrative: Patient is a 78 year old female with history of hypertension, hyperlipidemia who presented with 4 weeks history of progressive lower extremity swelling which continues to worsen despite being started on Bumex.  Chest ray showed cardiomegaly, pulmonary congestion.  Albumin was found to be low.  She was admitted for anasarca and was started on IV diuretics.  She had mild elevated liver enzymes and work-up which included right upper quadrant ultrasound which showed multiple mass in the liver.  Subsequent chest CT/abdomen/pelvis demonstrated bulky metastasis in the right liver and right kidney with omental and nodal metastasis in the abdomen.  Liver biopsy revealed adenocarcinoma of unknown primary.  Oncology was consulted.  She underwent EGD/colonoscopy on 09/16/18. Cardiology was following. Patient is medically stable for discharge to skilled nursing facility as soon as the bed is available.  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  Chest x-ray showed  pulmonary edema.  She had severe bilateral lower extremity edema.  Hypoalbuminemia and diastolic dysfunction are suspected to be primary contributors.  Lasix changed to oral 80 mg twice a day.  Also on spironolactone.  Cardiology was following.  Continue TED hose for bilateral lower extremity edema.  Will decrease Lasix to 60 mg twice a day because of acute kidney injury.   Metastatic adenocarcinoma of unknown primary: Bulky metastasis disease in the liver, extension to the right kidney superior pole, involvement of perigastric, porta hepatis and  omentum.  Elevated CEA. Liver biopsy confirmed adenocarcinoma.  Oncology was following.  CT also showed sigmoid colonic mass 4 cm. Underwent  EGD and colonoscopy today.  Findings include esophageal plaques consistent with candidiasis, nonbleeding erosive gastropathy. Colonoscopy revealed inadequate bowel preparation. Biopsy of the hemorrhagic, inflamed, nodular and thickened folds of mucosa in the sigmoid colon was done.  Will follow biopsy report. She already has an appointment with oncology.  Acute kidney injury: Kidney function slightly worsened today.  We will continue to monitor  Leukocytosis: Improving.  Right upper extremity swelling: Improved with elevation.  No DVT or SVT on ultrasound.  Abdominal pain: Improved.  Most likely associated with distention with bulky metastatic disease.  Continue supportive care.    Hypertension: Continue Norvasc,hydralazine.  Continue to monitor blood pressure.  Hypokalemia: Being supplemented  Hyperlipidemia: Continue statin  Proximal muscle weakness/deconditioning/debility: CPK, TSH normal.  PT/OT recommended skilled nursing facility.  Social worker consulted and working for [employer].  Patient is hemodynamically stable for discharge to skilled nursing facility as soon as the bed is available.   Nutrition Problem: Inadequate oral intake Etiology: acute illness      DVT prophylaxis: Lovenox Code Status: Full Family Communication: None present at the bedside Disposition Plan: Skilled nursing facility as soon as the bed is available   Consultants: Cardiology, GI, oncology  Procedures: None  Antimicrobials:  Anti-infectives (From admission, onward)   None      Subjective: Patient seen and examined the bedside this morning.  Events comfortable.  Hemodynamically stable.  Does not want to speak that much.  When asked if she has any questions she says "NO" Objective: Vitals:   09/17/18 7616 09/17/18 2108 09/18/18 0540 09/18/18 0737  BP:  (!) 140/51 (!) 132/47 (!) 150/49   Pulse: 74 88 73   Resp: 16 20 (!) 23 20  Temp: 98 F (36.7 C) 98.5 F (36.9 C) 98.4 F (36.9 C)   TempSrc: Oral Oral Oral   SpO2: 93% 90% 93%   Weight:    111.9 kg  Height:        Intake/Output Summary (Last 24 hours) at 09/18/2018 1024 Last data filed at 09/18/2018 0300 Gross per 24 hour  Intake -  Output 1650 ml  Net -1650 ml   Filed Weights   09/16/18 0954 09/17/18 0500 09/18/18 0552  Weight: 112.6 kg 114.3 kg 111.9 kg    Examination:  General exam: Appears calm and comfortable ,Not in distress,obese HEENT:PERRL,Oral mucosa dry, Ear/Nose normal on gross exam Respiratory system: Bilateral equal air entry, normal vesicular breath sounds, no wheezes or crackles  Cardiovascular system: S1 & S2 heard, RRR. No JVD, murmurs, rubs, gallops or clicks. Gastrointestinal system: Abdomen is nondistended, soft and nontender. No organomegaly or masses felt. Normal bowel sounds heard. Central nervous system: Alert and oriented. No focal neurological deficits. Extremities: 1-2 + lower extremity edema, TED hose,no clubbing ,no cyanosis, distal peripheral pulses palpable. Skin: No rashes, lesions or ulcers,no icterus ,no pallor    Data Reviewed: I have personally reviewed following labs and imaging studies  CBC: Recent Labs  Lab 09/13/18 0648 09/17/18 0540  WBC 12.3* 13.9*  NEUTROABS  --  12.7*  HGB 11.2* 10.9*  HCT 37.3 37.2  MCV 94.4 94.9  PLT 346 315   Basic Metabolic Panel: Recent Labs  Lab 09/12/18 0707  09/14/18 0649 09/15/18 0558 09/16/18 0558 09/17/18 0540 09/18/18 0548  NA 144   < > 146* 146* 144 146* 144  K 3.6   < > 2.8* 3.4* 4.4 3.5 3.8  CL 104   < > 107 106 107 109 108  CO2 29   < > 28 30 28 26 28   GLUCOSE 134*   < > 162* 167* 145* 131* 149*  BUN 36*   < > 43* 55* 57* 52* 56*  CREATININE 1.02*   < > 1.02* 1.30* 1.30* 1.27* 1.31*  CALCIUM 8.2*   < > 8.1* 8.0* 7.9* 8.0* 7.9*  MG 2.0  --   --   --   --   --   --    < > =  values in this interval not displayed.   GFR: Estimated Creatinine Clearance: 46.4 mL/min (A) (by C-G formula based on SCr of 1.31 mg/dL (H)). Liver Function Tests: Recent Labs  Lab 09/12/18 0707  AST 43*  ALT 47*  ALKPHOS 238*  BILITOT 1.1  PROT 5.6*  ALBUMIN 2.1*   No results for input(s): LIPASE, AMYLASE in the last 168 hours. No results for input(s): AMMONIA in the last 168 hours. Coagulation Profile: No results for input(s): INR, PROTIME in the last 168 hours. Cardiac Enzymes: No results for input(s): CKTOTAL, CKMB, CKMBINDEX, TROPONINI in the last 168 hours. BNP (last 3 results) No results for input(s): PROBNP in the last 8760 hours. HbA1C: No results for input(s): HGBA1C in the last 72 hours. CBG: No results for input(s): GLUCAP in the last 168 hours. Lipid Profile: No results for input(s): CHOL, HDL, LDLCALC, TRIG, CHOLHDL, LDLDIRECT in the last 72 hours. Thyroid Function Tests: No results for input(s): TSH, T4TOTAL, FREET4, T3FREE, THYROIDAB in the last 72 hours. Anemia Panel: No results for input(s): VITAMINB12, FOLATE, FERRITIN, TIBC, IRON, RETICCTPCT in the last 72  hours. Sepsis Labs: No results for input(s): PROCALCITON, LATICACIDVEN in the last 168 hours.  Recent Results (from the past 240 hour(s))  Surgical PCR screen     Status: None   Collection Time: 09/14/18  4:46 PM  Result Value Ref Range Status   MRSA, PCR NEGATIVE NEGATIVE Final   Staphylococcus aureus NEGATIVE NEGATIVE Final    Comment: (NOTE) The Xpert SA Assay (FDA approved for NASAL specimens in patients 81 years of age and older), is one component of a comprehensive surveillance program. It is not intended to diagnose infection nor to guide or monitor treatment. Performed at [facility], [place] [address] [place], [place] [address]          Radiology Studies: No results found.      Scheduled Meds: . amLODipine  5 mg Oral Daily  . enoxaparin (LOVENOX)  injection  40 mg Subcutaneous Daily  . feeding supplement (PRO-STAT SUGAR FREE 64)  30 mL Oral Daily  . furosemide  80 mg Oral BID  . hydrALAZINE  50 mg Oral Q8H  . multivitamin with minerals  1 tablet Oral Daily  . nystatin  5 mL Oral QID  . potassium chloride  40 mEq Oral BID  . simethicone  80 mg Oral QID  . sodium chloride flush  3 mL Intravenous Q12H  . spironolactone  25 mg Oral Daily   Continuous Infusions: . sodium chloride Stopped (09/09/18 1241)     LOS: 10 days    Time spent: 35 mins.More than 50% of that time was spent in counseling and/or coordination of care.      Shelly Coss, MD [facility] Pager [phone]  If 7PM-7AM, please contact night-coverage [email or web address] Password [facility] 09/18/2018, 10:24 AM

## 2018-09-19 ENCOUNTER — Encounter: Payer: Self-pay | Admitting: Hematology

## 2018-09-19 ENCOUNTER — Encounter (HOSPITAL_COMMUNITY): Payer: Self-pay | Admitting: Gastroenterology

## 2018-09-19 LAB — CBC WITH DIFFERENTIAL/PLATELET
Abs Immature Granulocytes: 0.12 10*3/uL — ABNORMAL HIGH (ref 0.00–0.07)
BASOS PCT: 0 %
Basophils Absolute: 0 10*3/uL (ref 0.0–0.1)
Eosinophils Absolute: 0 10*3/uL (ref 0.0–0.5)
Eosinophils Relative: 0 %
HCT: 36.1 % (ref 36.0–46.0)
Hemoglobin: 10.9 g/dL — ABNORMAL LOW (ref 12.0–15.0)
Immature Granulocytes: 1 %
LYMPHS ABS: 0.5 10*3/uL — AB (ref 0.7–4.0)
Lymphocytes Relative: 4 %
MCH: 28.5 pg (ref 26.0–34.0)
MCHC: 30.2 g/dL (ref 30.0–36.0)
MCV: 94.5 fL (ref 80.0–100.0)
Monocytes Absolute: 0.6 10*3/uL (ref 0.1–1.0)
Monocytes Relative: 5 %
Neutro Abs: 11.2 10*3/uL — ABNORMAL HIGH (ref 1.7–7.7)
Neutrophils Relative %: 90 %
Platelets: 353 10*3/uL (ref 150–400)
RBC: 3.82 MIL/uL — ABNORMAL LOW (ref 3.87–5.11)
RDW: 18.8 % — ABNORMAL HIGH (ref 11.5–15.5)
WBC: 12.4 10*3/uL — ABNORMAL HIGH (ref 4.0–10.5)
nRBC: 2.3 % — ABNORMAL HIGH (ref 0.0–0.2)

## 2018-09-19 LAB — BASIC METABOLIC PANEL
Anion gap: 11 (ref 5–15)
BUN: 58 mg/dL — ABNORMAL HIGH (ref 8–23)
CO2: 24 mmol/L (ref 22–32)
Calcium: 8 mg/dL — ABNORMAL LOW (ref 8.9–10.3)
Chloride: 110 mmol/L (ref 98–111)
Creatinine, Ser: 1.33 mg/dL — ABNORMAL HIGH (ref 0.44–1.00)
GFR calc Af Amer: 44 mL/min — ABNORMAL LOW (ref >60)
GFR, EST NON AFRICAN AMERICAN: 38 mL/min — AB (ref >60)
GLUCOSE: 144 mg/dL — AB (ref 70–99)
POTASSIUM: 3.9 mmol/L (ref 3.5–5.1)
Sodium: 145 mmol/L (ref 135–145)

## 2018-09-19 NOTE — Progress Notes (Signed)
Physical Therapy Treatment Patient Details Name: Carrie Chaney MRN: [id number] DOB: 1940/09/27 Today's Date: 09/19/2018    History of Present Illness 78 yo female admitted with progressive weakness, falls, bil LE edema, anasarca, CHF. 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 Hx of Dm, CHF.     PT Comments    Max encouragement for pt to initiate and self assist as much as possible. She is under the impression that she should only be moved using the lift equipment. Therapist and pt's sister explained importance of her participating with therapy (bed mobility, standing, transfers, etc). Pt will need ST rehab at [facility] for continued therapy.    Follow Up Recommendations  SNF     Equipment Recommendations       Recommendations for Other Services       Precautions / Restrictions Precautions Precautions: Fall Restrictions Weight Bearing Restrictions: No    Mobility  Bed Mobility Overal bed mobility: Needs Assistance Bed Mobility: Supine to Sit;Sit to Supine     Supine to sit: Max assist;+2 for physical assistance;+2 for safety/equipment Sit to supine: Max assist;+2 for physical assistance;+2 for safety/equipment   General bed mobility comments: Max encouragement for pt to initiate and self assist as much as possible. Multimodal cueing required. Increased time. Assist for trunk and bil LEs. Utilized bedpad to aid with scooting, positioning.   Transfers Overall transfer level: Needs assistance Equipment used: Rolling walker (2 wheeled) Transfers: Sit to/from Stand Sit to Stand: Total assist;+2 physical assistance;+2 safety/equipment         General transfer comment: x2 attempts to stand at bedside. Max encouragement for pt to initiate and self assist as much as possible. Limited effort from pt to get to standing-high risk for falls 2* this (pt sliding forward instead of rising from surface). May need to try STEDY next session.   Ambulation/Gait                  Stairs             Wheelchair Mobility    Modified Rankin (Stroke Patients Only)       Balance Overall balance assessment: Needs assistance Sitting-balance support: Bilateral upper extremity supported;Feet supported Sitting balance-Leahy Scale: Fair     Standing balance support: Bilateral upper extremity supported;During functional activity Standing balance-Leahy Scale: Poor                              Cognition Arousal/Alertness: Awake/alert Behavior During Therapy: WFL for tasks assessed/performed   Area of Impairment: Problem solving                             Problem Solving: Decreased initiation;Difficulty sequencing;Requires verbal cues;Requires tactile cues General Comments: pt quick to state "I can't" and requires MAX encouragement to increase self participation. Pt seems to be acting out a bit at times-possbily due to loss of control of situation      Exercises      General Comments        Pertinent Vitals/Pain Pain Assessment: No/denies pain    Home Living                      Prior Function            PT Goals (current goals can now be found in the care plan section) Progress towards PT goals: Progressing toward goals  Frequency    Min 3X/week(for insurance purposes)      PT Plan Current plan remains appropriate    Co-evaluation              AM-PAC PT "6 Clicks" Mobility   Outcome Measure  Help needed turning from your back to your side while in a flat bed without using bedrails?: A Lot Help needed moving from lying on your back to sitting on the side of a flat bed without using bedrails?: Total Help needed moving to and from a bed to a chair (including a wheelchair)?: Total Help needed standing up from a chair using your arms (e.g., wheelchair or bedside chair)?: Total Help needed to walk in hospital room?: Total Help needed climbing 3-5 steps with a railing? : Total 6  Click Score: 7    End of Session Equipment Utilized During Treatment: Gait belt Activity Tolerance: Patient tolerated treatment well Patient left: in bed;with call bell/phone within reach;with bed alarm set   PT Visit Diagnosis: Muscle weakness (generalized) (M62.81);Difficulty in walking, not elsewhere classified (R26.2);History of falling (Z91.81);Unsteadiness on feet (R26.81)     Time: 1100-1131 PT Time Calculation (min) (ACUTE ONLY): 31 min  Charges:  $Therapeutic Activity: 23-37 mins                        Weston Anna, PT Acute Rehabilitation Services Pager: [phone] Office: [phone]

## 2018-09-19 NOTE — Care Management Important Message (Signed)
Important Message  Patient Details  Name: Carrie Chaney MRN: [id number] Date of Birth: 1940-10-25   Medicare Important Message Given:  Yes    Kerin Salen 09/19/2018, 11:11 AMImportant Message  Patient Details  Name: Carrie Chaney MRN: [id number] Date of Birth: 07/07/1941   Medicare Important Message Given:  Yes    Kerin Salen 09/19/2018, 11:11 AM

## 2018-09-19 NOTE — Progress Notes (Addendum)
2:58 PM Patient needs updated OT note for insurance auth.   LCSW paged OT. Awaiting response.   12:54PM LCSW following for SNF placement.   Patient had to chose new facility due to insurance.   Patient made facility choice. Insurance auth started.   Patient can dc to facility when auth is received.  LCSW will continue to follow.   Carrie Chaney [place]

## 2018-09-19 NOTE — Progress Notes (Signed)
PROGRESS NOTE    Carrie Chaney  YKD:[id number] DOB: [birth year] DOA: 09/07/2018 PCP: Lucianne Lei, MD   Brief Narrative: Patient is a 78 year old female with history of hypertension, hyperlipidemia who presented with 4 weeks history of progressive lower extremity swelling which continues to worsen despite being started on Bumex.  Chest ray showed cardiomegaly, pulmonary congestion.  Albumin was found to be low.  She was admitted for anasarca and was started on IV diuretics.  She had mild elevated liver enzymes and work-up which included right upper quadrant ultrasound which showed multiple mass in the liver.  Subsequent chest CT/abdomen/pelvis demonstrated bulky metastasis in the right liver and right kidney with omental and nodal metastasis in the abdomen.  Liver biopsy revealed adenocarcinoma of unknown primary.  Oncology was consulted.  She underwent EGD/colonoscopy on 09/16/18. Cardiology was following. Patient is medically stable for discharge to skilled nursing facility as soon as the bed is available.  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  Chest x-ray showed  pulmonary edema.  She had severe bilateral lower extremity edema.  Hypoalbuminemia and diastolic dysfunction are suspected to be primary contributors.  Lasix changed to oral 80 mg twice a day.  Also on spironolactone.  Cardiology was following.  Continue TED hose for bilateral lower extremity edema.  Deccreased Lasix to 60 mg twice a day because of acute kidney injury.   Metastatic adenocarcinoma of unknown primary: Bulky metastasis disease in the liver, extension to the right kidney superior pole, involvement of perigastric, porta hepatis and omentum.   Elevated CEA. Liver biopsy confirmed adenocarcinoma.  Oncology was following.  CT also showed sigmoid colonic mass 4 cm. Underwent  EGD and colonoscopy today.  Findings include esophageal plaques consistent with candidiasis, nonbleeding erosive gastropathy. Colonoscopy revealed inadequate bowel preparation. Biopsy of the hemorrhagic, inflamed, nodular and thickened folds of mucosa in the sigmoid colon was done.  Will follow biopsy report. She already has an appointment with oncology.  Acute kidney injury: Creatinine plateaued around 1.3.  We will continue to monitor  Leukocytosis: Improving.  Right upper extremity swelling: Improved with elevation.  No DVT or SVT on ultrasound.  Abdominal pain: Improved.  Most likely associated with distention with bulky metastatic disease.  Continue supportive care.    Hypertension: Continue Norvasc,hydralazine.  Continue to monitor blood pressure.  Hyperlipidemia: Continue statin  Proximal muscle weakness/deconditioning/debility: CPK, TSH normal.  PT/OT recommended skilled nursing facility.  Social worker consulted and working for [employer].  Patient is hemodynamically stable for discharge to skilled nursing facility as soon as the bed is available.   Nutrition Problem: Inadequate oral intake Etiology: acute illness      DVT prophylaxis: Lovenox Code Status: Full Family Communication: None present at the bedside Disposition Plan: Skilled nursing facility as soon as the bed is available   Consultants: Cardiology, GI, oncology  Procedures: None  Antimicrobials:  Anti-infectives (From admission, onward)   None      Subjective:  Patient seen and examined the bedside this morning.  Remains comfortable.  Hemodynamically stable.  No active issues.  Objective: Vitals:   09/18/18 0552 09/18/18 1433 09/18/18 2138 09/19/18 0446  BP:  (!) 147/48 (!) 134/51 (!) 139/45  Pulse:  74 77 73  Resp: 20 13  15 18  Temp:  98 F (36.7 C) 98.3 F (36.8 C)  98.1 F (36.7 C)  TempSrc:   Oral Oral  SpO2:  94% 96% 95%  Weight: 111.9 kg   112.2 kg  Height:        Intake/Output Summary (Last 24 hours) at 09/19/2018 1242 Last data filed at 09/19/2018 0935 Gross per 24 hour  Intake 60 ml  Output 1450 ml  Net -1390 ml   Filed Weights   09/17/18 0500 09/18/18 0552 09/19/18 0446  Weight: 114.3 kg 111.9 kg 112.2 kg    Examination:  General exam: Appears calm and comfortable ,Not in distress,obese HEENT:PERRL,Oral mucosa moist, Ear/Nose normal on gross exam Respiratory system: Bilateral equal air entry, normal vesicular breath sounds, no wheezes or crackles  Cardiovascular system: S1 & S2 heard, RRR. No JVD, murmurs, rubs, gallops or clicks. Gastrointestinal system: Abdomen is nondistended, soft and nontender. No organomegaly or masses felt. Normal bowel sounds heard. Central nervous system: Alert and oriented. No focal neurological deficits. Extremities:1-2 + bilateral lower extremity edema, TED hose , no clubbing ,no cyanosis, distal peripheral pulses palpable. Skin: No rashes, lesions or ulcers,no icterus ,no pallor   Data Reviewed: I have personally reviewed following labs and imaging studies  CBC: Recent Labs  Lab 09/13/18 0648 09/17/18 0540 09/19/18 0601  WBC 12.3* 13.9* 12.4*  NEUTROABS  --  12.7* 11.2*  HGB 11.2* 10.9* 10.9*  HCT 37.3 37.2 36.1  MCV 94.4 94.9 94.5  PLT 346 371 284   Basic Metabolic Panel: Recent Labs  Lab 09/15/18 0558 09/16/18 0558 09/17/18 0540 09/18/18 0548 09/19/18 0601  NA 146* 144 146* 144 145  K 3.4* 4.4 3.5 3.8 3.9  CL 106 107 109 108 110  CO2 30 28 26 28 24   GLUCOSE 167* 145* 131* 149* 144*  BUN 55* 57* 52* 56* 58*  CREATININE 1.30* 1.30* 1.27* 1.31* 1.33*  CALCIUM 8.0* 7.9* 8.0* 7.9* 8.0*   GFR: Estimated Creatinine Clearance: 45.8 mL/min (A) (by C-G formula based on SCr of 1.33 mg/dL (H)). Liver Function Tests: No results for input(s): AST, ALT, ALKPHOS, BILITOT, PROT, ALBUMIN in the  last 168 hours. No results for input(s): LIPASE, AMYLASE in the last 168 hours. No results for input(s): AMMONIA in the last 168 hours. Coagulation Profile: No results for input(s): INR, PROTIME in the last 168 hours. Cardiac Enzymes: No results for input(s): CKTOTAL, CKMB, CKMBINDEX, TROPONINI in the last 168 hours. BNP (last 3 results) No results for input(s): PROBNP in the last 8760 hours. HbA1C: No results for input(s): HGBA1C in the last 72 hours. CBG: No results for input(s): GLUCAP in the last 168 hours. Lipid Profile: No results for input(s): CHOL, HDL, LDLCALC, TRIG, CHOLHDL, LDLDIRECT in the last 72 hours. Thyroid Function Tests: No results for input(s): TSH, T4TOTAL, FREET4, T3FREE, THYROIDAB in the last 72 hours. Anemia Panel: No results for input(s): VITAMINB12, FOLATE, FERRITIN, TIBC, IRON, RETICCTPCT in the last 72 hours. Sepsis Labs: No results for input(s): PROCALCITON, LATICACIDVEN in the last 168 hours.  Recent Results (from the past 240 hour(s))  Surgical PCR screen     Status: None   Collection Time: 09/14/18  4:46 PM  Result Value Ref Range Status   MRSA, PCR NEGATIVE NEGATIVE Final   Staphylococcus aureus NEGATIVE NEGATIVE Final    Comment: (NOTE) The Xpert SA Assay (FDA approved for NASAL specimens in patients 56 years of age and older), is one component of a comprehensive surveillance program. It is not intended  to diagnose infection nor to guide or monitor treatment. Performed at [facility], [place] [address] [place], [place] [address]          Radiology Studies: No results found.      Scheduled Meds: . amLODipine  5 mg Oral Daily  . enoxaparin (LOVENOX) injection  40 mg Subcutaneous Daily  . feeding supplement (PRO-STAT SUGAR FREE 64)  30 mL Oral Daily  . furosemide  60 mg Oral BID  . hydrALAZINE  50 mg Oral Q8H  . multivitamin with minerals  1 tablet Oral Daily  . nystatin  5 mL Oral QID  . potassium chloride  40  mEq Oral BID  . simethicone  80 mg Oral QID  . sodium chloride flush  3 mL Intravenous Q12H  . spironolactone  25 mg Oral Daily   Continuous Infusions: . sodium chloride Stopped (09/09/18 1241)     LOS: 11 days    Time spent: 15 mins.More than 50% of that time was spent in counseling and/or coordination of care.      Shelly Coss, MD [facility] Pager [phone]  If 7PM-7AM, please contact night-coverage [email or web address] Password [facility] 09/19/2018, 12:42 PM

## 2018-09-20 MED ORDER — NYSTATIN 100000 UNIT/ML MT SUSP: 5.0000 mL | Freq: Four times a day (QID) | OROMUCOSAL | 0 refills | Status: AC

## 2018-09-20 MED ORDER — HYDRALAZINE HCL 50 MG PO TABS: 50.0000 mg | ORAL_TABLET | Freq: Three times a day (TID) | ORAL | Status: AC

## 2018-09-20 MED ORDER — ADULT MULTIVITAMIN W/MINERALS CH: 1.0000 | ORAL_TABLET | Freq: Every day | ORAL | Status: AC

## 2018-09-20 MED ORDER — AMLODIPINE BESYLATE 5 MG PO TABS: 5.0000 mg | ORAL_TABLET | Freq: Every day | ORAL | Status: AC

## 2018-09-20 MED ORDER — SPIRONOLACTONE 25 MG PO TABS: 25.0000 mg | ORAL_TABLET | Freq: Every day | ORAL | Status: AC

## 2018-09-20 MED ORDER — POTASSIUM CHLORIDE CRYS ER 20 MEQ PO TBCR: 40.0000 meq | EXTENDED_RELEASE_TABLET | Freq: Two times a day (BID) | ORAL | Status: AC

## 2018-09-20 MED ORDER — FUROSEMIDE 80 MG PO TABS: 80.0000 mg | ORAL_TABLET | Freq: Two times a day (BID) | ORAL | Status: AC

## 2018-09-20 NOTE — Clinical Social Work Placement (Signed)
    Patient has bed at [facility].   LCSW confirmed bed with facility. Room 140  LCSW faxed dc docs.   Patient to transport by [facility].  RN report # [id number]  BKJ   CLINICAL SOCIAL WORK PLACEMENT  NOTE  Date:  09/20/2018  Patient Details  Name: Carrie Chaney MRN: [id number] Date of Birth: 1941/01/02  Clinical Social Work is seeking post-discharge placement for this patient at the [place] level of care (*CSW will initial, date and re-position this form in  chart as items are completed):  Yes   Patient/family provided with [facility] Department's list of facilities offering this level of care within the geographic area requested by the patient (or if unable, by the patient's family).  Yes   Patient/family informed of their freedom to choose among providers that offer the needed level of care, that participate in Medicare, Medicaid or managed care program needed by the patient, have an available bed and are willing to accept the patient.  Yes   Patient/family informed of [facility]'s ownership interest in [facility] and [facility], as well as of the fact that they are under no obligation to receive care at these facilities.  PASRR submitted to EDS on       PASRR number received on 09/16/18     Existing PASRR number confirmed on       FL2 transmitted to all facilities in geographic area requested by pt/family on 09/16/18     FL2 transmitted to all facilities within larger geographic area on       Patient informed that his/her managed care company has contracts with or will negotiate with certain facilities, including the following:        Yes   Patient/family informed of bed offers received.  Patient chooses bed at [facility])     Physician recommends and patient chooses bed at      Patient to be transferred to [facility]) on 09/20/18.  Patient to be transferred to facility by EMS       Patient family notified on 09/20/18 of transfer.  Name of family member notified:  Ricci Barker, Sister     PHYSICIAN       Additional Comment:    _______________________________________________ Servando Snare, LCSW 09/20/2018, 12:12 PM

## 2018-09-20 NOTE — Progress Notes (Signed)
Occupational Therapy Treatment Patient Details Name: Carrie Chaney MRN: [id number] DOB: Jun 23, 1941 Today's Date: 09/20/2018    History of present illness 78 yo female admitted with progressive weakness, falls, bil LE edema, anasarca, CHF. 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 Hx of Dm, CHF.    OT comments  Pt agreed to sit EOB with OT.  Pt needed MAX encouragement from OT and sister.    Follow Up Recommendations  SNF    Equipment Recommendations  None recommended by OT    Recommendations for Other Services      Precautions / Restrictions Precautions Precautions: Fall       Mobility Bed Mobility Overal bed mobility: Needs Assistance Bed Mobility: Supine to Sit;Sit to Supine Rolling: Max assist   Supine to sit: Max assist;HOB elevated Sit to supine: Max assist;+2 for physical assistance;+2 for safety/equipment   General bed mobility comments: Max encouragement for pt to initiate and self assist as much as possible. Multimodal cueing required. Increased time. Assist for trunk and bil LEs. Utilized bedpad to aid with scooting, positioning.   Transfers      NT                Balance Overall balance assessment: Needs assistance Sitting-balance support: Feet supported;Single extremity supported Sitting balance-Leahy Scale: Fair                                     ADL either performed or assessed with clinical judgement   ADL Overall ADL's : Needs assistance/impaired     Grooming: Wash/dry face;Set up;Sitting Grooming Details (indicate cue type and reason): sitting EOB                                     Vision Patient Visual Report: No change from baseline            Cognition Arousal/Alertness: Awake/alert Behavior During Therapy: WFL for tasks assessed/performed Overall Cognitive Status: Within Functional Limits for tasks assessed Area of Impairment: Safety/judgement;Awareness;Problem  solving                         Safety/Judgement: Decreased awareness of deficits   Problem Solving: Decreased initiation;Difficulty sequencing;Requires verbal cues;Requires tactile cues                     Pertinent Vitals/ Pain       Faces Pain Scale: Hurts a little bit Pain Location: abdomen with sitting Pain Descriptors / Indicators: Grimacing Pain Intervention(s): Limited activity within patient's tolerance;Repositioned;Monitored during session         Frequency  Min 2X/week        Progress Toward Goals  OT Goals(current goals can now be found in the care plan section)  Progress towards OT goals: Progressing toward goals     Plan Discharge plan remains appropriate       AM-PAC OT "6 Clicks" Daily Activity     Outcome Measure   Help from another person eating meals?: None Help from another person taking care of personal grooming?: A Little Help from another person toileting, which includes using toliet, bedpan, or urinal?: Total Help from another person bathing (including washing, rinsing, drying)?: A Lot Help from another person to put on and taking off regular upper body clothing?: A Little Help  from another person to put on and taking off regular lower body clothing?: Total 6 Click Score: 14    End of Session Equipment Utilized During Treatment: (maxi move)  OT Visit Diagnosis: Unsteadiness on feet (R26.81);Muscle weakness (generalized) (M62.81)   Activity Tolerance Patient tolerated treatment well   Patient Left in bed;with bed alarm set   Nurse Communication Mobility status;Precautions        Time: 7017-7939 OT Time Calculation (min): 22 min  Charges: OT General Charges $OT Visit: 1 Visit OT Treatments $Self Care/Home Management : 8-22 mins  Kari Baars, [place] [phone] Office- Hand D 09/20/2018, 10:12 AM

## 2018-09-20 NOTE — Discharge Summary (Addendum)
Physician Discharge Summary  Carrie Chaney DGL:[id number] DOB: August 27, 1940 DOA: 09/07/2018  PCP: Lucianne Lei, MD  Admit date: 09/07/2018 Discharge date: 09/20/2018  Admitted From: Home Disposition:  Home  Discharge Condition:Stable CODE STATUS:FULL Diet recommendation: Heart Healthy  Brief/Interim Summary:  Patient is a 78 year old female with history of hypertension, hyperlipidemia who presented with 4 weeks history of progressive lower extremity swelling which continues to worsen despite being started on Bumex.  Chest ray showed cardiomegaly, pulmonary congestion.  Albumin was found to be low.  She was admitted for anasarca and was started on IV diuretics.  She had mild elevated liver enzymes and work-up which included right upper quadrant ultrasound which showed multiple mass in the liver.  Subsequent chest CT/abdomen/pelvis demonstrated bulky metastasis in the right liver and right kidney with omental and nodal metastasis in the abdomen.  Liver biopsy revealed adenocarcinoma of unknown primary.  Oncology was consulted.  She underwent EGD/colonoscopy on 09/16/18. Cardiology was following. Patient is medically stable for discharge to skilled nursing facility today.  Following problems were addressed during her  hospitalization:  Anasarca/acute on chronic heart failure with preserved ejection fraction: Grossly fluid overloaded on presentation.  Chest x-ray showed  pulmonary edema.  She had severe bilateral lower extremity edema.  Hypoalbuminemia and diastolic dysfunction are suspected to be primary contributors. Started on oral 80 mg twice a day.  Also on spironolactone.  Cardiology was following.  Continue TED hose for bilateral lower extremity edema.    Metastatic adenocarcinoma of unknown primary: Bulky metastasis disease in the liver, extension to the right kidney superior pole, involvement of perigastric, porta hepatis and omentum.  Elevated CEA. Liver biopsy confirmed adenocarcinoma.   Oncology was following.  CT also showed sigmoid colonic mass 4 cm. Underwent  EGD and colonoscopy here.  Findings include esophageal plaques consistent with candidiasis, nonbleeding erosive gastropathy. Colonoscopy revealed inadequate bowel preparation. Biopsy of the hemorrhagic, inflamed, nodular and thickened folds of mucosa in the sigmoid colon was done.  Will follow biopsy report. She already has an appointment with oncology.  Acute kidney injury: Creatinine plateaued around 1.3.  Continue to monitor as an outpatient  Leukocytosis: Improving.  Right upper extremity swelling: Improved with elevation.  No DVT or SVT on ultrasound.  Abdominal pain: Improved.  Most likely associated with distention with bulky metastatic disease.  Continue supportive care.    Hypertension: Continue Norvasc,hydralazine.  Continue to monitor blood pressure.  Hyperlipidemia: Continue statin  Proximal muscle weakness/deconditioning/debility: CPK, TSH normal.  PT/OT recommended skilled nursing facility.  Social worker consulted and working for [employer].  Patient is hemodynamically stable for discharge to skilled nursing facility as soon as the bed is available.    Discharge Diagnoses:  Principal Problem:   Metastatic adenocarcinoma ([place]) Active Problems:   Hypertension   Anasarca   Elevated liver enzymes   Proximal limb muscle weakness   Abnormal LFTs   Weakness    Discharge Instructions  Discharge Instructions    Diet - low sodium heart healthy   Complete by:  As directed    Discharge instructions   Complete by:  As directed    1)Take prescribed medications as instructed. 2)Follow up with oncology and cardiology as an outpatient.  Appointments have been scheduled. 3)Do a CBC and BMP tests in a week.   Increase activity slowly   Complete by:  As directed      Allergies as of 09/20/2018      Reactions   Tomato Other (See Comments)   Breaks out gums  Medication List    STOP  taking these medications   atorvastatin 40 MG tablet Commonly known as:  LIPITOR   bumetanide 0.5 MG tablet Commonly known as:  BUMEX   EDARBI 80 MG Tabs Generic drug:  Azilsartan Medoxomil     TAKE these medications   amLODipine 5 MG tablet Commonly known as:  NORVASC Take 1 tablet (5 mg total) by mouth daily. Start taking on:  September 21, 2018 What changed:    medication strength  how much to take   furosemide 80 MG tablet Commonly known as:  LASIX Take 1 tablet (80 mg total) by mouth 2 (two) times daily.   hydrALAZINE 50 MG tablet Commonly known as:  APRESOLINE Take 1 tablet (50 mg total) by mouth every 8 (eight) hours.   multivitamin with minerals Tabs tablet Take 1 tablet by mouth daily. Start taking on:  September 21, 2018   nystatin 100000 UNIT/ML suspension Commonly known as:  MYCOSTATIN Take 5 mLs (500,000 Units total) by mouth 4 (four) times daily for 7 days.   potassium chloride SA 20 MEQ tablet Commonly known as:  K-DUR,KLOR-CON Take 2 tablets (40 mEq total) by mouth 2 (two) times daily.   spironolactone 25 MG tablet Commonly known as:  ALDACTONE Take 1 tablet (25 mg total) by mouth daily. Start taking on:  September 21, 2018       Contact information for follow-up providers    Kathlen Mody, Nicki Reaper T, PA-C. Go on 10/07/2018.   Specialties:  Cardiology, Physician Assistant Why:  @10 :33 for hospital follow up with Dr. Alan Ripper PA. Please arrive 15 minutes early Contact information: [address] [address] [phone]            Contact information for after-discharge care    Destination    [facility] .   Service:  Skilled Nursing Contact information: [place] [address] [phone]                 Allergies  Allergen Reactions  . Tomato Other (See Comments)    Breaks out gums    Consultations: Cardiology, GI, oncology  Procedures/Studies: Dg Chest 2  View  Result Date: 09/07/2018 CLINICAL DATA:  Swelling of the left lower extremity for a month. Dyspnea. EXAM: CHEST - 2 VIEW COMPARISON:  None. FINDINGS: Mild cardiomegaly with aortic atherosclerosis. Central vascular congestion consistent with mild CHF is noted. Atelectasis is seen at the right lung base. IMPRESSION: Cardiomegaly with aortic atherosclerosis. Central vascular congestion consistent with mild CHF. Electronically Signed   By: [name] M.D.   On: 09/07/2018 20:01   Ct Chest W Contrast  Result Date: 09/08/2018 CLINICAL DATA:  Liver masses on ultrasound earlier today.  Anasarca. EXAM: CT CHEST, ABDOMEN, AND PELVIS WITH CONTRAST TECHNIQUE: Multidetector CT imaging of the chest, abdomen and pelvis was performed following the standard protocol during bolus administration of intravenous contrast. CONTRAST:  17mL OMNIPAQUE IOHEXOL 300 MG/ML SOLN, 63mL OMNIPAQUE IOHEXOL 300 MG/ML SOLN COMPARISON:  Ultrasound exam 09/08/2018 FINDINGS: CT CHEST FINDINGS Cardiovascular: The heart is enlarged. Coronary artery calcification is evident. Atherosclerotic calcification is noted in the wall of the thoracic aorta. Mediastinum/Nodes: No mediastinal lymphadenopathy. There is no hilar lymphadenopathy. There is no axillary lymphadenopathy. The esophagus has normal imaging features. Lungs/Pleura: The central tracheobronchial airways are patent. Subsegmental atelectasis noted in the dependent lower lungs bilaterally. No suspicious pulmonary nodule or mass. No substantial pleural effusion. Musculoskeletal: No worrisome lytic or sclerotic  osseous abnormality. CT ABDOMEN PELVIS FINDINGS Hepatobiliary: As seen on the recent ultrasound exam, there are multiple liver masses, bulky year in the right lobe and in the left. Index lesion in the medial right liver measures 6.9 x 5.0 cm on 54/2. Index lesion in the medial segment left liver measures 2.5 cm. A large exophytic lesion seen from the inferior right liver extends into  Morison's pouch and generates substantial mass-effect on the right kidney. There is no evidence for gallstones, gallbladder wall thickening, or pericholecystic fluid. No intrahepatic or extrahepatic biliary dilation. Pancreas: No focal mass lesion. No dilatation of the main duct. No intraparenchymal cyst. No peripancreatic edema. Spleen: No splenomegaly. No focal mass lesion. Adrenals/Urinary Tract: Right adrenal gland not visualized. Left adrenal gland unremarkable. 4.1 cm cyst noted in the upper pole left kidney. The mass in the inferior right liver extending down to the upper pole the right kidney does generate mass-effect on the right kidney but in some regions, appears to extend into the upper pole parenchyma of the right kidney (see coronal image 96 of series 5). 7 clear whether this is an exophytic mass arising from the upper right kidney or potentially hepatic origin invading the upper pole the right kidney. No evidence for hydroureter. The urinary bladder appears normal for the degree of distention. Stomach/Bowel: Stomach is unremarkable. No gastric wall thickening. No evidence of outlet obstruction. Duodenum is normally positioned as is the ligament of Treitz. No small bowel wall thickening. No small bowel dilatation. The terminal ileum is normal. The appendix is not visualized, but there is no edema or inflammation in the region of the cecum. Right colon and transverse colon are unremarkable. Diverticular changes are noted in the left colon. 3.4 x 3.8 cm soft tissue mass is identified along the sigmoid colon (99/2). Vascular/Lymphatic: There is abdominal aortic atherosclerosis without aneurysm. IVC is displaced to the left by the inferior right hepatic disease. 1.6 cm short axis hepato duodenal ligament lymph node evident on 58/2. 1.9 cm lymph node in the porta hepatis visible on 62/2. 10 mm short axis aortocaval lymph node visible on 72/2. No pelvic sidewall lymphadenopathy. Reproductive: Uterus  surgically absent.  There is no adnexal mass. Other: Small volume free fluid seen adjacent to the liver and spleen as well as in the cul-de-sac. 16 mm perigastric nodule noted on 67/2. Similar adjacent nodule measures up to 2.0 cm. Multiple omental nodules are evident including 2.0 x 1.4 cm left omental nodule on 88/2. Musculoskeletal: No worrisome lytic or sclerotic osseous abnormality. IMPRESSION: 1. 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 2. Bulky metastatic disease is identified between the right liver in the upper pole the right kidney. This appears to involve both the liver and the kidney and is unclear whether this is primarily from the liver invading the upper pole the right kidney or potentially a right upper pole renal neoplasm invading the liver. 3. Perigastric, porta hepatis, and omental metastatic disease. Borderline enlarged retroperitoneal lymph nodes also suggest metastatic involvement. 4. 4 cm soft tissue mass associated with the sigmoid colon. While this could certainly be a primary colonic neoplasm, it has a severe coal configuration and does not have typical colorectal cancer CT imaging appearance. Given the metastatic disease elsewhere in the abdomen/pelvis, this could also represent metastatic disease in the pelvis. 5. Given the findings immediately above, no obvious primary source for the diffuse metastatic disease is evident. If the lesion along the sigmoid colon ends  up being colorectal cancer, than the other findings in the abdomen and pelvis are certainly consistent with metastatic disease. Right adrenal or right renal cancer with direct liver invasion along with liver metastases, peritoneal/omental disease and metastatic lymphadenopathy would also be a consideration. 6. No evidence for metastatic disease in the thorax. 7. Aortic Atherosclerois (ICD10-170.0) Electronically Signed   By: Misty Stanley M.D.   On: 09/08/2018  18:30   Ct Abdomen Pelvis W Contrast  Result Date: 09/08/2018 CLINICAL DATA:  Liver masses on ultrasound earlier today.  Anasarca. EXAM: CT CHEST, ABDOMEN, AND PELVIS WITH CONTRAST TECHNIQUE: Multidetector CT imaging of the chest, abdomen and pelvis was performed following the standard protocol during bolus administration of intravenous contrast. CONTRAST:  147mL OMNIPAQUE IOHEXOL 300 MG/ML SOLN, 47mL OMNIPAQUE IOHEXOL 300 MG/ML SOLN COMPARISON:  Ultrasound exam 09/08/2018 FINDINGS: CT CHEST FINDINGS Cardiovascular: The heart is enlarged. Coronary artery calcification is evident. Atherosclerotic calcification is noted in the wall of the thoracic aorta. Mediastinum/Nodes: No mediastinal lymphadenopathy. There is no hilar lymphadenopathy. There is no axillary lymphadenopathy. The esophagus has normal imaging features. Lungs/Pleura: The central tracheobronchial airways are patent. Subsegmental atelectasis noted in the dependent lower lungs bilaterally. No suspicious pulmonary nodule or mass. No substantial pleural effusion. Musculoskeletal: No worrisome lytic or sclerotic osseous abnormality. CT ABDOMEN PELVIS FINDINGS Hepatobiliary: As seen on the recent ultrasound exam, there are multiple liver masses, bulky year in the right lobe and in the left. Index lesion in the medial right liver measures 6.9 x 5.0 cm on 54/2. Index lesion in the medial segment left liver measures 2.5 cm. A large exophytic lesion seen from the inferior right liver extends into Morison's pouch and generates substantial mass-effect on the right kidney. There is no evidence for gallstones, gallbladder wall thickening, or pericholecystic fluid. No intrahepatic or extrahepatic biliary dilation. Pancreas: No focal mass lesion. No dilatation of the main duct. No intraparenchymal cyst. No peripancreatic edema. Spleen: No splenomegaly. No focal mass lesion. Adrenals/Urinary Tract: Right adrenal gland not visualized. Left adrenal gland unremarkable.  4.1 cm cyst noted in the upper pole left kidney. The mass in the inferior right liver extending down to the upper pole the right kidney does generate mass-effect on the right kidney but in some regions, appears to extend into the upper pole parenchyma of the right kidney (see coronal image 96 of series 5). 7 clear whether this is an exophytic mass arising from the upper right kidney or potentially hepatic origin invading the upper pole the right kidney. No evidence for hydroureter. The urinary bladder appears normal for the degree of distention. Stomach/Bowel: Stomach is unremarkable. No gastric wall thickening. No evidence of outlet obstruction. Duodenum is normally positioned as is the ligament of Treitz. No small bowel wall thickening. No small bowel dilatation. The terminal ileum is normal. The appendix is not visualized, but there is no edema or inflammation in the region of the cecum. Right colon and transverse colon are unremarkable. Diverticular changes are noted in the left colon. 3.4 x 3.8 cm soft tissue mass is identified along the sigmoid colon (99/2). Vascular/Lymphatic: There is abdominal aortic atherosclerosis without aneurysm. IVC is displaced to the left by the inferior right hepatic disease. 1.6 cm short axis hepato duodenal ligament lymph node evident on 58/2. 1.9 cm lymph node in the porta hepatis visible on 62/2. 10 mm short axis aortocaval lymph node visible on 72/2. No pelvic sidewall lymphadenopathy. Reproductive: Uterus surgically absent.  There is no adnexal mass. Other: Small volume free fluid  seen adjacent to the liver and spleen as well as in the cul-de-sac. 16 mm perigastric nodule noted on 67/2. Similar adjacent nodule measures up to 2.0 cm. Multiple omental nodules are evident including 2.0 x 1.4 cm left omental nodule on 88/2. Musculoskeletal: No worrisome lytic or sclerotic osseous abnormality. IMPRESSION: 1. Bulky metastatic disease identified in the liver, with inferior right  liver essentially replaced by tumor. 2. Bulky metastatic disease is identified between the right liver in the upper pole the right kidney. This appears to involve both the liver and the kidney and is unclear whether this is primarily from the liver invading the upper pole the right kidney or potentially a right upper pole renal neoplasm invading the liver. 3. Perigastric, porta hepatis, and omental metastatic disease. Borderline enlarged retroperitoneal lymph nodes also suggest metastatic involvement. 4. 4 cm soft tissue mass associated with the sigmoid colon. While this could certainly be a primary colonic neoplasm, it has a severe coal configuration and does not have typical colorectal cancer CT imaging appearance. Given the metastatic disease elsewhere in the abdomen/pelvis, this could also represent metastatic disease in the pelvis. 5. Given the findings immediately above, no obvious primary source for the diffuse metastatic disease is evident. If the lesion along the sigmoid colon ends up being colorectal cancer, than the other findings in the abdomen and pelvis are certainly consistent with metastatic disease. Right adrenal or right renal cancer with direct liver invasion along with liver metastases, peritoneal/omental disease and metastatic lymphadenopathy would also be a consideration. 6. No evidence for metastatic disease in the thorax. 7. Aortic Atherosclerois (ICD10-170.0) Electronically Signed   By: Misty Stanley M.D.   On: 09/08/2018 18:30   US Biopsy (liver)  Result Date: 09/09/2018 INDICATION: LIVER MASS, SUSPECT COLON PRIMARY EXAM: ULTRASOUND LEFT LIVER MASS BIOPSY MEDICATIONS: 1% lidocaine local ANESTHESIA/SEDATION: Moderate (conscious) sedation was employed during this procedure. A total of Versed 1.0 mg and Fentanyl 50 mcg was administered intravenously. Moderate Sedation Time: 10 minutes. The patient's level of consciousness and vital signs were monitored continuously by radiology nursing  throughout the procedure under my direct supervision. FLUOROSCOPY TIME:  Fluoroscopy Time: None. COMPLICATIONS: None immediate. PROCEDURE: Informed written consent was obtained from the patient after a thorough discussion of the procedural risks, benefits and alternatives. All questions were addressed. Maximal Sterile Barrier Technique was utilized including caps, mask, sterile gowns, sterile gloves, sterile drape, hand hygiene and skin antiseptic. A timeout was performed prior to the initiation of the procedure. Previous imaging reviewed. Patient positioned supine. Preliminary ultrasound performed. A left lobe lesion is demonstrated in the epigastric region. Overlying skin marked. Under sterile conditions and local anesthesia, a 17 gauge 6.8 cm access needle was advanced from an anterior epigastric approach to the lesion. Needle position confirmed with ultrasound. 18 gauge core biopsies obtained under direct ultrasound. Images obtained for documentation. Samples were placed in formalin. Needle removed. Patient tolerated the biopsy well. No immediate complication IMPRESSION: Successful ultrasound left liver mass 18 gauge core biopsy Electronically Signed   By: Jerilynn Mages.  Shick M.D.   On: 09/09/2018 14:40   Vas [place] Upper Extremity Venous Duplex  Result Date: 09/12/2018 UPPER VENOUS STUDY  Indications: Swelling Performing Technologist: Oliver Hum RVT  Examination Guidelines: A complete evaluation includes B-mode imaging, spectral Doppler, color Doppler, and power Doppler as needed of all accessible portions of each vessel. Bilateral testing is considered an integral part of a complete examination. Limited examinations for reoccurring indications may be performed as noted.  Right Findings: +----------+------------+----------+---------+-----------+-------+  RIGHT     CompressiblePropertiesPhasicitySpontaneousSummary +----------+------------+----------+---------+-----------+-------+ IJV           Full                  Yes       Yes            +----------+------------+----------+---------+-----------+-------+ Subclavian    Full                 Yes       Yes            +----------+------------+----------+---------+-----------+-------+ Axillary      Full                 Yes       Yes            +----------+------------+----------+---------+-----------+-------+ Brachial      Full                 Yes       Yes            +----------+------------+----------+---------+-----------+-------+ Radial        Full                                          +----------+------------+----------+---------+-----------+-------+ Ulnar         Full                                          +----------+------------+----------+---------+-----------+-------+ Cephalic      Full                                          +----------+------------+----------+---------+-----------+-------+ Basilic       Full                                          +----------+------------+----------+---------+-----------+-------+  Left Findings: +----------+------------+----------+---------+-----------+-------+ LEFT      CompressiblePropertiesPhasicitySpontaneousSummary +----------+------------+----------+---------+-----------+-------+ Subclavian    Full                 Yes       Yes            +----------+------------+----------+---------+-----------+-------+  Summary:  Right: No evidence of deep vein thrombosis in the upper extremity. No evidence of superficial vein thrombosis in the upper extremity.  Left: No evidence of thrombosis in the subclavian.  *See table(s) above for measurements and observations.  Diagnosing physician: Ruta Hinds MD Electronically signed by Ruta Hinds MD on 09/12/2018 at 3:30:20 PM.    Final    US Abdomen Limited Ruq  Result Date: 09/08/2018 CLINICAL DATA:  78 year old female with abnormal LFTs.  Anasarca. EXAM: ULTRASOUND ABDOMEN LIMITED RIGHT UPPER QUADRANT  COMPARISON:  None. FINDINGS: Gallbladder: No gallstones or wall thickening visualized. No sonographic Murphy sign noted by sonographer. Common bile duct: Diameter: 4 millimeters, normal. Liver: Abnormal, highly irregular and heterogeneous liver echogenicity suggesting multiple liver masses ranging from 2.5 centimeters to 10.5 centimeters diameter (images 18, 35, 43). The liver contour seems to remain relatively smooth (image 31). Portal vein is patent on color Doppler imaging with normal direction of  blood flow towards the liver. Other findings: No ascites. IMPRESSION: Positive for multiple liver masses without obvious cirrhosis. Favor metastatic disease over primary liver tumor. CT chest abdomen and pelvis (oral and IV contrast preferred) recommended. Electronically Signed   By: Genevie Ann M.D.   On: 09/08/2018 02:20      Subjective:   Discharge Exam: Vitals:   09/19/18 2107 09/20/18 0451  BP: (!) 136/52 (!) 132/45  Pulse: 85 78  Resp: (!) 21 20  Temp: 98.8 F (37.1 C) 97.8 F (36.6 C)  SpO2: 94% 96%   Vitals:   09/19/18 1305 09/19/18 2107 09/20/18 0451 09/20/18 0500  BP: (!) 112/49 (!) 136/52 (!) 132/45   Pulse: 83 85 78   Resp: 18 (!) 21 20   Temp: 97.7 F (36.5 C) 98.8 F (37.1 C) 97.8 F (36.6 C)   TempSrc: Oral Oral Oral   SpO2: 95% 94% 96%   Weight:    115.2 kg  Height:        General: Pt is alert, awake, not in acute distress,obese Cardiovascular: RRR, S1/S2 +, no rubs, no gallops Respiratory: CTA bilaterally, no wheezing, no rhonchi Abdominal: Soft, NT, ND, bowel sounds + Extremities: 2-3 + bilateral lower extremity edema, no cyanosis    The results of significant diagnostics from this hospitalization (including imaging, microbiology, ancillary and laboratory) are listed below for reference.     Microbiology: Recent Results (from the past 240 hour(s))  Surgical PCR screen     Status: None   Collection Time: 09/14/18  4:46 PM  Result Value Ref Range Status    MRSA, PCR NEGATIVE NEGATIVE Final   Staphylococcus aureus NEGATIVE NEGATIVE Final    Comment: (NOTE) The Xpert SA Assay (FDA approved for NASAL specimens in patients 5 years of age and older), is one component of a comprehensive surveillance program. It is not intended to diagnose infection nor to guide or monitor treatment. Performed at [facility], [place] [address] [place], [place] [address]      Labs: BNP (last 3 results) Recent Labs    09/07/18 2203  BNP 130.8*   Basic Metabolic Panel: Recent Labs  Lab 09/15/18 0558 09/16/18 0558 09/17/18 0540 09/18/18 0548 09/19/18 0601  NA 146* 144 146* 144 145  K 3.4* 4.4 3.5 3.8 3.9  CL 106 107 109 108 110  CO2 30 28 26 28 24   GLUCOSE 167* 145* 131* 149* 144*  BUN 55* 57* 52* 56* 58*  CREATININE 1.30* 1.30* 1.27* 1.31* 1.33*  CALCIUM 8.0* 7.9* 8.0* 7.9* 8.0*   Liver Function Tests: No results for input(s): AST, ALT, ALKPHOS, BILITOT, PROT, ALBUMIN in the last 168 hours. No results for input(s): LIPASE, AMYLASE in the last 168 hours. No results for input(s): AMMONIA in the last 168 hours. CBC: Recent Labs  Lab 09/17/18 0540 09/19/18 0601  WBC 13.9* 12.4*  NEUTROABS 12.7* 11.2*  HGB 10.9* 10.9*  HCT 37.2 36.1  MCV 94.9 94.5  PLT 371 353   Cardiac Enzymes: No results for input(s): CKTOTAL, CKMB, CKMBINDEX, TROPONINI in the last 168 hours. BNP: Invalid input(s): POCBNP CBG: No results for input(s): GLUCAP in the last 168 hours. D-Dimer No results for input(s): DDIMER in the last 72 hours. Hgb A1c No results for input(s): HGBA1C in the last 72 hours. Lipid Profile No results for input(s): CHOL, HDL, LDLCALC, TRIG, CHOLHDL, LDLDIRECT in the last 72 hours. Thyroid function studies No results for input(s): TSH, T4TOTAL, T3FREE, THYROIDAB in the last 72 hours.  Invalid input(s): FREET3 Anemia work up No results for input(s): VITAMINB12, FOLATE, FERRITIN, TIBC, IRON, RETICCTPCT in the last 72  hours. Urinalysis    Component Value Date/Time   COLORURINE YELLOW 09/08/2018 [place] 09/08/2018 0447   LABSPEC 1.008 09/08/2018 0447   PHURINE 7.0 09/08/2018 [place] 09/08/2018 0447   HGBUR NEGATIVE 09/08/2018 [place] 09/08/2018 [place] 09/08/2018 0447   PROTEINUR NEGATIVE 09/08/2018 0447   NITRITE NEGATIVE 09/08/2018 0447   LEUKOCYTESUR NEGATIVE 09/08/2018 0447   Sepsis Labs Invalid input(s): PROCALCITONIN,  WBC,  LACTICIDVEN Microbiology Recent Results (from the past 240 hour(s))  Surgical PCR screen     Status: None   Collection Time: 09/14/18  4:46 PM  Result Value Ref Range Status   MRSA, PCR NEGATIVE NEGATIVE Final   Staphylococcus aureus NEGATIVE NEGATIVE Final    Comment: (NOTE) The Xpert SA Assay (FDA approved for NASAL specimens in patients 51 years of age and older), is one component of a comprehensive surveillance program. It is not intended to diagnose infection nor to guide or monitor treatment. Performed at [facility], [address], [place], [place] [address]     Please note: You were cared for by a hospitalist during your hospital stay. Once you are discharged, your primary care physician will handle any further medical issues. Please note that NO REFILLS for any discharge medications will be authorized once you are discharged, as it is imperative that you return to your primary care physician (or establish a relationship with a primary care physician if you do not have one) for your post hospital discharge needs so that they can reassess your need for medications and monitor your lab values.    Time coordinating discharge: 40 minutes  SIGNED:   Shelly Coss, MD  [facility] 09/20/2018, 1:43 PM Pager [phone]  If 7PM-7AM, please contact night-coverage [email or web address] Password [email or web address]

## 2018-09-30 ENCOUNTER — Telehealth: Payer: Self-pay

## 2018-09-30 NOTE — Telephone Encounter (Signed)
Patient's sister [name] calls has questions regarding the final results of the biopsies that were taken.  Her sister is in SNF but will not answer her phone that is right beside.  The patient has appointment on 3/15.    [name] [phone]

## 2018-10-01 NOTE — Telephone Encounter (Signed)
I have called Ms Carrie Chaney back, and discussed pt's endoscopy biopsy results, and FO results. She updated me pt's recent condition, being bed-bound, very swelling, and has poor appetite and oral intake. The SNF is going to send her to here for her appointment on 3/17 and her sisters will come also. She appreciated my call.   Truitt Merle MD

## 2018-10-03 ENCOUNTER — Emergency Department (HOSPITAL_COMMUNITY): Payer: Medicare Other

## 2018-10-03 ENCOUNTER — Encounter (HOSPITAL_COMMUNITY): Payer: Self-pay | Admitting: Emergency Medicine

## 2018-10-03 ENCOUNTER — Inpatient Hospital Stay (HOSPITAL_COMMUNITY)
Admission: EM | Admit: 2018-10-03 | Discharge: 2018-10-19 | DRG: 871 | Disposition: E | Payer: Medicare Other | Attending: Pulmonary Disease | Admitting: Pulmonary Disease

## 2018-10-03 ENCOUNTER — Other Ambulatory Visit: Payer: Self-pay

## 2018-10-03 DIAGNOSIS — E785 Hyperlipidemia, unspecified: Secondary | ICD-10-CM | POA: Diagnosis present

## 2018-10-03 DIAGNOSIS — K921 Melena: Secondary | ICD-10-CM | POA: Diagnosis present

## 2018-10-03 DIAGNOSIS — E46 Unspecified protein-calorie malnutrition: Secondary | ICD-10-CM | POA: Diagnosis present

## 2018-10-03 DIAGNOSIS — A419 Sepsis, unspecified organism: Secondary | ICD-10-CM | POA: Diagnosis present

## 2018-10-03 DIAGNOSIS — C799 Secondary malignant neoplasm of unspecified site: Secondary | ICD-10-CM

## 2018-10-03 DIAGNOSIS — R6521 Severe sepsis with septic shock: Secondary | ICD-10-CM | POA: Diagnosis present

## 2018-10-03 DIAGNOSIS — R7989 Other specified abnormal findings of blood chemistry: Secondary | ICD-10-CM | POA: Diagnosis present

## 2018-10-03 DIAGNOSIS — J9601 Acute respiratory failure with hypoxia: Secondary | ICD-10-CM | POA: Diagnosis present

## 2018-10-03 DIAGNOSIS — Z7401 Bed confinement status: Secondary | ICD-10-CM

## 2018-10-03 DIAGNOSIS — C787 Secondary malignant neoplasm of liver and intrahepatic bile duct: Secondary | ICD-10-CM | POA: Diagnosis present

## 2018-10-03 DIAGNOSIS — E872 Acidosis, unspecified: Secondary | ICD-10-CM | POA: Diagnosis present

## 2018-10-03 DIAGNOSIS — E875 Hyperkalemia: Secondary | ICD-10-CM

## 2018-10-03 DIAGNOSIS — R4182 Altered mental status, unspecified: Secondary | ICD-10-CM

## 2018-10-03 DIAGNOSIS — N390 Urinary tract infection, site not specified: Secondary | ICD-10-CM | POA: Diagnosis present

## 2018-10-03 DIAGNOSIS — R740 Nonspecific elevation of levels of transaminase and lactic acid dehydrogenase [LDH]: Secondary | ICD-10-CM | POA: Diagnosis present

## 2018-10-03 DIAGNOSIS — K659 Peritonitis, unspecified: Secondary | ICD-10-CM | POA: Diagnosis present

## 2018-10-03 DIAGNOSIS — L8990 Pressure ulcer of unspecified site, unspecified stage: Secondary | ICD-10-CM | POA: Diagnosis present

## 2018-10-03 DIAGNOSIS — Z6838 Body mass index (BMI) 38.0-38.9, adult: Secondary | ICD-10-CM

## 2018-10-03 DIAGNOSIS — Z515 Encounter for palliative care: Secondary | ICD-10-CM | POA: Diagnosis not present

## 2018-10-03 DIAGNOSIS — C786 Secondary malignant neoplasm of retroperitoneum and peritoneum: Secondary | ICD-10-CM | POA: Diagnosis present

## 2018-10-03 DIAGNOSIS — K631 Perforation of intestine (nontraumatic): Secondary | ICD-10-CM | POA: Diagnosis not present

## 2018-10-03 DIAGNOSIS — Z66 Do not resuscitate: Secondary | ICD-10-CM | POA: Diagnosis not present

## 2018-10-03 DIAGNOSIS — R945 Abnormal results of liver function studies: Secondary | ICD-10-CM | POA: Diagnosis present

## 2018-10-03 DIAGNOSIS — R4781 Slurred speech: Secondary | ICD-10-CM | POA: Diagnosis present

## 2018-10-03 DIAGNOSIS — Z91018 Allergy to other foods: Secondary | ICD-10-CM

## 2018-10-03 DIAGNOSIS — Z452 Encounter for adjustment and management of vascular access device: Secondary | ICD-10-CM

## 2018-10-03 DIAGNOSIS — R0902 Hypoxemia: Secondary | ICD-10-CM

## 2018-10-03 DIAGNOSIS — M3119 Other thrombotic microangiopathy: Secondary | ICD-10-CM | POA: Diagnosis present

## 2018-10-03 DIAGNOSIS — N179 Acute kidney failure, unspecified: Secondary | ICD-10-CM

## 2018-10-03 DIAGNOSIS — I10 Essential (primary) hypertension: Secondary | ICD-10-CM | POA: Diagnosis present

## 2018-10-03 DIAGNOSIS — Z8249 Family history of ischemic heart disease and other diseases of the circulatory system: Secondary | ICD-10-CM | POA: Diagnosis not present

## 2018-10-03 DIAGNOSIS — G92 Toxic encephalopathy: Secondary | ICD-10-CM | POA: Diagnosis present

## 2018-10-03 DIAGNOSIS — D696 Thrombocytopenia, unspecified: Secondary | ICD-10-CM

## 2018-10-03 DIAGNOSIS — R601 Generalized edema: Secondary | ICD-10-CM | POA: Diagnosis present

## 2018-10-03 DIAGNOSIS — R627 Adult failure to thrive: Secondary | ICD-10-CM | POA: Diagnosis present

## 2018-10-03 DIAGNOSIS — C801 Malignant (primary) neoplasm, unspecified: Secondary | ICD-10-CM | POA: Diagnosis present

## 2018-10-03 DIAGNOSIS — E43 Unspecified severe protein-calorie malnutrition: Secondary | ICD-10-CM | POA: Diagnosis present

## 2018-10-03 DIAGNOSIS — E119 Type 2 diabetes mellitus without complications: Secondary | ICD-10-CM | POA: Diagnosis present

## 2018-10-03 DIAGNOSIS — Z833 Family history of diabetes mellitus: Secondary | ICD-10-CM

## 2018-10-03 DIAGNOSIS — M311 Thrombotic microangiopathy: Secondary | ICD-10-CM | POA: Diagnosis present

## 2018-10-03 DIAGNOSIS — Z87891 Personal history of nicotine dependence: Secondary | ICD-10-CM | POA: Diagnosis not present

## 2018-10-03 DIAGNOSIS — I2510 Atherosclerotic heart disease of native coronary artery without angina pectoris: Secondary | ICD-10-CM | POA: Diagnosis present

## 2018-10-03 DIAGNOSIS — Z978 Presence of other specified devices: Secondary | ICD-10-CM

## 2018-10-03 DIAGNOSIS — G928 Other toxic encephalopathy: Secondary | ICD-10-CM | POA: Diagnosis present

## 2018-10-03 DIAGNOSIS — R9431 Abnormal electrocardiogram [ECG] [EKG]: Secondary | ICD-10-CM | POA: Diagnosis present

## 2018-10-03 DIAGNOSIS — E860 Dehydration: Secondary | ICD-10-CM | POA: Diagnosis present

## 2018-10-03 DIAGNOSIS — I493 Ventricular premature depolarization: Secondary | ICD-10-CM | POA: Diagnosis not present

## 2018-10-03 HISTORY — DX: Secondary malignant neoplasm of unspecified site: C79.9

## 2018-10-03 HISTORY — DX: Acute kidney failure, unspecified: N17.9

## 2018-10-03 HISTORY — DX: Malignant (primary) neoplasm, unspecified: C80.1

## 2018-10-03 HISTORY — DX: Essential (primary) hypertension: I10

## 2018-10-03 HISTORY — DX: Generalized edema: R60.1

## 2018-10-03 HISTORY — DX: Malignant (primary) neoplasm, unspecified: C79.9

## 2018-10-03 LAB — COMPREHENSIVE METABOLIC PANEL
ALT: 199 U/L — ABNORMAL HIGH (ref 0–44)
AST: 121 U/L — ABNORMAL HIGH (ref 15–41)
Albumin: 1.2 g/dL — ABNORMAL LOW (ref 3.5–5.0)
Alkaline Phosphatase: 304 U/L — ABNORMAL HIGH (ref 38–126)
Anion gap: 15 (ref 5–15)
BILIRUBIN TOTAL: 2.8 mg/dL — AB (ref 0.3–1.2)
BUN: 112 mg/dL — ABNORMAL HIGH (ref 8–23)
CALCIUM: 7.8 mg/dL — AB (ref 8.9–10.3)
CO2: 15 mmol/L — ABNORMAL LOW (ref 22–32)
Chloride: 112 mmol/L — ABNORMAL HIGH (ref 98–111)
Creatinine, Ser: 3.11 mg/dL — ABNORMAL HIGH (ref 0.44–1.00)
GFR calc Af Amer: 16 mL/min — ABNORMAL LOW (ref >60)
GFR calc non Af Amer: 14 mL/min — ABNORMAL LOW (ref >60)
Glucose, Bld: 144 mg/dL — ABNORMAL HIGH (ref 70–99)
Potassium: 6.6 mmol/L (ref 3.5–5.1)
Sodium: 142 mmol/L (ref 135–145)
Total Protein: 5.2 g/dL — ABNORMAL LOW (ref 6.5–8.1)

## 2018-10-03 LAB — APTT: aPTT: 28 seconds (ref 24–36)

## 2018-10-03 LAB — URINALYSIS, ROUTINE W REFLEX MICROSCOPIC
Glucose, UA: NEGATIVE mg/dL
Ketones, ur: NEGATIVE mg/dL
Nitrite: NEGATIVE
Protein, ur: 30 mg/dL — AB
Specific Gravity, Urine: 1.017 (ref 1.005–1.030)
WBC, UA: >50 WBC/hpf — ABNORMAL HIGH (ref 0–5)
pH: 6 (ref 5.0–8.0)

## 2018-10-03 LAB — POCT I-STAT 7, (LYTES, BLD GAS, ICA,H+H)
Acid-base deficit: 8 mmol/L — ABNORMAL HIGH (ref 0.0–2.0)
Bicarbonate: 14.6 mmol/L — ABNORMAL LOW (ref 20.0–28.0)
Calcium, Ion: 1.04 mmol/L — ABNORMAL LOW (ref 1.15–1.40)
HCT: 28 % — ABNORMAL LOW (ref 36.0–46.0)
Hemoglobin: 9.5 g/dL — ABNORMAL LOW (ref 12.0–15.0)
O2 Saturation: 93 %
POTASSIUM: 5.3 mmol/L — AB (ref 3.5–5.1)
Sodium: 144 mmol/L (ref 135–145)
TCO2: 15 mmol/L — ABNORMAL LOW (ref 22–32)
pCO2 arterial: 21.8 mmHg — ABNORMAL LOW (ref 32.0–48.0)
pH, Arterial: 7.434 (ref 7.350–7.450)
pO2, Arterial: 62 mmHg — ABNORMAL LOW (ref 83.0–108.0)

## 2018-10-03 LAB — DIFFERENTIAL
Abs Immature Granulocytes: 0.08 10*3/uL — ABNORMAL HIGH (ref 0.00–0.07)
Basophils Absolute: 0 10*3/uL (ref 0.0–0.1)
Basophils Relative: 1 %
Eosinophils Absolute: 0.3 10*3/uL (ref 0.0–0.5)
Eosinophils Relative: 4 %
Immature Granulocytes: 1 %
LYMPHS ABS: 0.3 10*3/uL — AB (ref 0.7–4.0)
LYMPHS PCT: 4 %
Monocytes Absolute: 0.1 10*3/uL (ref 0.1–1.0)
Monocytes Relative: 2 %
Neutro Abs: 5.9 10*3/uL (ref 1.7–7.7)
Neutrophils Relative %: 88 %

## 2018-10-03 LAB — I-STAT CREATININE, ED: Creatinine, Ser: 3 mg/dL — ABNORMAL HIGH (ref 0.44–1.00)

## 2018-10-03 LAB — GLUCOSE, CAPILLARY: Glucose-Capillary: 106 mg/dL — ABNORMAL HIGH (ref 70–99)

## 2018-10-03 LAB — DIC (DISSEMINATED INTRAVASCULAR COAGULATION)PANEL
D-Dimer, Quant: 12.86 ug/mL-FEU — ABNORMAL HIGH (ref 0.00–0.50)
Fibrinogen: 468 mg/dL (ref 210–475)
INR: 1.5 — ABNORMAL HIGH (ref 0.8–1.2)
Prothrombin Time: 17.8 seconds — ABNORMAL HIGH (ref 11.4–15.2)
Smear Review: NONE SEEN
aPTT: 30 seconds (ref 24–36)

## 2018-10-03 LAB — RAPID URINE DRUG SCREEN, HOSP PERFORMED
Amphetamines: NOT DETECTED
BENZODIAZEPINES: NOT DETECTED
Barbiturates: NOT DETECTED
Cocaine: NOT DETECTED
Opiates: NOT DETECTED
Tetrahydrocannabinol: NOT DETECTED

## 2018-10-03 LAB — PROTIME-INR
INR: 1.4 — ABNORMAL HIGH (ref 0.8–1.2)
Prothrombin Time: 16.9 seconds — ABNORMAL HIGH (ref 11.4–15.2)

## 2018-10-03 LAB — DIC (DISSEMINATED INTRAVASCULAR COAGULATION) PANEL: PLATELETS: 12 10*3/uL — AB (ref 150–400)

## 2018-10-03 LAB — ETHANOL: Alcohol, Ethyl (B): <10 mg/dL (ref <10)

## 2018-10-03 LAB — LACTIC ACID, PLASMA: Lactic Acid, Venous: 4.5 mmol/L (ref 0.5–1.9)

## 2018-10-03 LAB — SAVE SMEAR(SSMR), FOR PROVIDER SLIDE REVIEW

## 2018-10-03 MED ORDER — SODIUM CHLORIDE 0.9 % IV SOLN
1.0000 g | Freq: Once | INTRAVENOUS | Status: AC
  Administered 2018-10-03: 1 g via INTRAVENOUS
  Filled 2018-10-03: qty 10

## 2018-10-03 MED ORDER — IOHEXOL 350 MG/ML SOLN
60.0000 mL | Freq: Once | INTRAVENOUS | Status: AC | PRN
  Administered 2018-10-03: 60 mL via INTRAVENOUS

## 2018-10-03 MED ORDER — PANTOPRAZOLE SODIUM 40 MG IV SOLR
40.0000 mg | Freq: Every day | INTRAVENOUS | Status: DC
  Administered 2018-10-04: 40 mg via INTRAVENOUS
  Filled 2018-10-03: qty 40

## 2018-10-03 MED ORDER — SODIUM CHLORIDE 0.9 % IV SOLN
1.0000 g | INTRAVENOUS | Status: DC
  Filled 2018-10-03: qty 10

## 2018-10-03 MED ORDER — SODIUM CHLORIDE 0.9 % IV SOLN
100.0000 mL/h | INTRAVENOUS | Status: DC
  Administered 2018-10-03: 100 mL/h via INTRAVENOUS

## 2018-10-03 MED ORDER — SODIUM CHLORIDE 0.9% IV SOLUTION
Freq: Once | INTRAVENOUS | Status: AC
  Administered 2018-10-04: 01:00:00 via INTRAVENOUS

## 2018-10-03 MED ORDER — SODIUM CHLORIDE 0.9 % IV BOLUS
1000.0000 mL | Freq: Once | INTRAVENOUS | Status: AC
  Administered 2018-10-03: 1000 mL via INTRAVENOUS

## 2018-10-03 MED ORDER — THIAMINE HCL 100 MG/ML IJ SOLN: 100.0000 mg | Freq: Every day | INTRAMUSCULAR | Status: DC

## 2018-10-03 MED ORDER — SODIUM BICARBONATE 8.4 % IV SOLN
INTRAVENOUS | Status: DC
  Administered 2018-10-03: 22:00:00 via INTRAVENOUS
  Filled 2018-10-03: qty 1000

## 2018-10-03 MED ORDER — SODIUM CHLORIDE 0.9 % IV BOLUS: 1000.0000 mL | INTRAVENOUS | Status: AC

## 2018-10-03 MED ORDER — FUROSEMIDE 10 MG/ML IJ SOLN: 80.0000 mg | Freq: Once | INTRAMUSCULAR | Status: DC

## 2018-10-03 MED ORDER — INSULIN ASPART 100 UNIT/ML ~~LOC~~ SOLN: 0.0000 [IU] | SUBCUTANEOUS | Status: DC

## 2018-10-03 MED ORDER — SODIUM ZIRCONIUM CYCLOSILICATE 5 G PO PACK
5.0000 g | PACK | Freq: Once | ORAL | Status: DC
  Filled 2018-10-03: qty 1

## 2018-10-03 MED ORDER — INSULIN ASPART 100 UNIT/ML IV SOLN
5.0000 [IU] | Freq: Once | INTRAVENOUS | Status: AC
  Administered 2018-10-03: 5 [IU] via INTRAVENOUS

## 2018-10-03 MED ORDER — SODIUM CHLORIDE 0.9 % IV BOLUS
500.0000 mL | Freq: Once | INTRAVENOUS | Status: AC
  Administered 2018-10-03: 500 mL via INTRAVENOUS

## 2018-10-03 NOTE — Progress Notes (Signed)
eLink Physician-Brief Progress Note Patient Name: Chasey Dull DOB: 09/25/1940 MRN: [id number]   Date of Service  10/01/2018  HPI/Events of Note  Perineal wounds - Request for Foley catheter.   eICU Interventions  Will order: 1. Place Foley Catheter.      Intervention Category Major Interventions: Other:  Regenia Erck Cornelia Copa 10/15/2018, 10:00 PM

## 2018-10-03 NOTE — H&P (Signed)
NAMELizbet Chaney, MRN:  [id number], DOB:  [birth year], LOS: 0 ADMISSION DATE:  10/14/2018, CONSULTATION DATE:  10/08/2018 REFERRING MD:  Dr. Alcario Drought, CHIEF COMPLAINT:  AMS  Brief History   3 yoF presenting from SNF with new onset x 1 day altered mental status with slurred speech and generalized weakness.  Code stroke called in ER, with neg CT/ CTA head/ neck.   New AKI, hyperkalemia, and severe thrombocytopenia of 9.    History of present illness   78 year old female with history of hypertension, hyperlipidemia, DM, and recent diagnosis of metastatic adenocarcinoma presenting from SNF with altered mental status.    Recently hospitalized 2/19 to 3/3 with progressive extremity swelling found ot have metastatic adenocarcinoma of unknown primary.  CT abd/pelvis showing metastatic disease in the liver, right kidney, retroperitoneal lymph nodes, 4 cm sigmoid mass.  Liver biopsy confirmed adenocarcinoma, underwent EGD and colonoscopy.  She is being followed by oncology, initial visit was scheduled for 3/17.     Patient's two sisters at bedside report that patient was very active and functional prior to 2/19 admit.  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 Additionally since discharge and being at SNF, she has not been eating well, mainly drinking fluids only.    In ER, code stroke called with neg CT head and CTA head/ neck with neurology consulting.  Patient has been afebrile and hemodynamically stable.  Labs noted for  Na 142, K 6.6, Cl 112, glucose 144, BUN 112, sCr 3.11, increased transaminitis from last admit, Lactic 4.5, WBC 6.7, Hgb 11.2, Platelets 9 (decreased from  353 on 09/19/2018), INR 1.4, PT 16.9, DIC panel and smear pending, UA noted for pyuria.  PCCM called for concern and possible admit to ICU.  Past Medical History  HTN, HLD, metastatic adenocarcinoma with unclear primary, DM  Significant Hospital Events   2/19- 3/3  hospitalized, d/c SNF 3/16 admit  Consults:  Neurology Oncology   Procedures:   Significant Diagnostic Tests:  3/16 [facility] >> neg 3/16 CTA head/ neck >> 1. No intracranial or extracranial anterior circulation stenosis of significance. 2. 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 4. Subpleural blebs along the medial RIGHT upper lobe. Superior segment RIGHT lower lobe atelectasis. 5. Based on the decision of the ordering provider at the scanner, CT perfusion was canceled.  CXR 3/16 >>  Micro Data:  3/16 BC x2 >> 3/16 UC >>  Antimicrobials:  3/16 ceftriaxone >>  Interim history/subjective:   Objective   Blood pressure 108/61, pulse 97, temperature 98.9 F (37.2 C), temperature source (S) Rectal, resp. rate (!) 22, SpO2 94 %.       No intake or output data in the 24 hours ending 10/02/2018 1953 There were no vitals filed for this visit.  Examination: General:  Elderly female lying in bed in NAD HEENT: MM pink/dry,  Neuro: Awake, oriented to person, place, ?some expressive aphasia, slurred speech, MAE weakly  CV: SR, no murmur PULM: even/non-labored, tachypnea, clear  GI: distended, non tender, +bs Extremities: warm/dry, anasarca  Skin: purpura petechial rash more extensive on upper extremities/ diffuse, and chest compared to lower extremities  Resolved Hospital Problem list    Assessment & Plan:  Thrombocytopenia  Metastatic adenocarcinoma - unclear primary source - ddx TTP vs HUS vs DIC/ sepsis P:  Oncology consulted Pending  DIC panel, smear, and [id number] T&S Trend CBC May need HD catheter for plasma exchange Consider palliative care consult  Sepsis secondary to UTI - currently normotensive  P:  Continue ceftriaxone Follow UC and BC Trend lactate Assess PCT Goal MAP > 65 Trend WBC/ fever curve   AKI and  s/p IV contrast Hyperkalemia Lactic acidosis  Anasarca  P:  Additional 2L IV to complete 30 ml/kg  S/p insulin 5 units IV and lokelma q 4 BMP  Bicarb gtt Renal CTw/o contrast to rule out renal obstruction Trend urinary output/ daily wt Avoid nephrotoxic agents Consider renal consult  Mild hypoxic respiratory failure P:  Currently protecting airway Supplemental O2 for sats > 92 CXR Assess urine leg  Transaminitis P:  Trend LFTs/ coags  Abnormal EKG - TTE 09/08/2018 EF >65 P:  Assess BNP, troponin/ trend EKG Tele montioring  Acute encephalopathy  P:  Seen by neurology Will need beside swallow eval, to remain NPO till  Frequent neuro checks  MRI brain when able Thiamine daily  Assess ammonia  HTN P:  Norvasc, lasix, hydralazine, and spironolactone   Protein calorie malnutrition P:  Maximize nutrition when able Dietary consult  DM P:  SSI sensitive CBG q 4  Best practice:  Diet: NPO Pain/Anxiety/Delirium protocol (if indicated): n/a VAP protocol (if indicated): n/a DVT prophylaxis: none GI prophylaxis: PPI Glucose control: SSI Mobility: BR Code Status: Full  Family Communication: Sisters and patient updated on plan of care Disposition: ICU  Labs   CBC: Recent Labs  Lab 10/01/2018 1748  WBC 6.7  NEUTROABS 5.9  HGB 11.2*  HCT 34.6*  MCV 86.5  PLT 9*    Basic Metabolic Panel: Recent Labs  Lab 10/07/2018 1748 09/20/2018 1753  NA 142  --   K 6.6*  --   CL 112*  --   CO2 15*  --   GLUCOSE 144*  --   BUN 112*  --   CREATININE 3.11* 3.00*  CALCIUM 7.8*  --    GFR: Estimated Creatinine Clearance: 20.6 mL/min (A) (by C-G formula based on SCr of 3 mg/dL (H)). Recent Labs  Lab 09/24/2018 1748  WBC 6.7    Liver Function Tests: Recent Labs  Lab 10/15/2018 1748  AST 121*  ALT 199*  ALKPHOS 304*  BILITOT 2.8*  PROT 5.2*  ALBUMIN 1.2*   No results for input(s): LIPASE, AMYLASE in the last 168 hours. No results for input(s): AMMONIA in  the last 168 hours.  ABG No results found for: PHART, PCO2ART, PO2ART, HCO3, TCO2, ACIDBASEDEF, O2SAT   Coagulation Profile: Recent Labs  Lab 10/03/18 1748  INR 1.4*    Cardiac Enzymes: No results for input(s): CKTOTAL, CKMB, CKMBINDEX, TROPONINI in the last 168 hours.  HbA1C: Hgb A1c MFr Bld  Date/Time Value Ref Range Status  08/15/2016 06:13 AM 5.0 4.8 - 5.6 % Final    Comment:    (NOTE)         Pre-diabetes: 5.7 - 6.4         Diabetes: >6.4         Glycemic control for adults with diabetes: <7.0     CBG: No results for input(s): GLUCAP in the last 168 hours.  Review of Systems:   unable  Past Medical History  She,  has a past medical history of Acute kidney failure ([place]), Diabetes mellitus without complication ([place]), Generalized edema, Hyperlipemia, Hypertension, and Secondary malignant neoplasm of unspecified site [facility]).   Surgical History    Past  Surgical History:  Procedure Laterality Date  . ABDOMINAL HYSTERECTOMY     She is unsure if her ovaries were removed.   Marland Kitchen BIOPSY  09/16/2018   Procedure: BIOPSY;  Surgeon: Otis Brace, MD;  Location: [facility];  Service: Gastroenterology;;  EGD and Colon  . COLONOSCOPY WITH PROPOFOL N/A 09/16/2018   Procedure: COLONOSCOPY WITH PROPOFOL;  Surgeon: Otis Brace, MD;  Location: [facility];  Service: Gastroenterology;  Laterality: N/A;  . ESOPHAGOGASTRODUODENOSCOPY (EGD) WITH PROPOFOL N/A 09/16/2018   Procedure: ESOPHAGOGASTRODUODENOSCOPY (EGD) WITH PROPOFOL;  Surgeon: Otis Brace, MD;  Location: [facility];  Service: Gastroenterology;  Laterality: N/A;     Social History   reports that she quit smoking about 6 weeks ago. She smoked 0.25 packs per day. She has never used smokeless tobacco. She reports that she does not drink alcohol or use drugs.   Family History   Her family history includes Diabetes in an other family member; Hypertension in her mother. There is no history of Cancer.   Allergies  Allergies  Allergen Reactions  . Tomato Other (See Comments)    Breaks out gums     Home Medications  Prior to Admission medications   Medication Sig Start Date End Date Taking? Authorizing Provider  amLODipine (NORVASC) 5 MG tablet Take 1 tablet (5 mg total) by mouth daily. 09/21/18   Shelly Coss, MD  furosemide (LASIX) 80 MG tablet Take 1 tablet (80 mg total) by mouth 2 (two) times daily. 09/20/18   Shelly Coss, MD  hydrALAZINE (APRESOLINE) 50 MG tablet Take 1 tablet (50 mg total) by mouth every 8 (eight) hours. 09/20/18   Shelly Coss, MD  Multiple Vitamin (MULTIVITAMIN WITH MINERALS) TABS tablet Take 1 tablet by mouth daily. 09/21/18   Shelly Coss, MD  potassium chloride SA (K-DUR,KLOR-CON) 20 MEQ tablet Take 2 tablets (40 mEq total) by mouth 2 (two) times daily. 09/20/18   Shelly Coss, MD  spironolactone (ALDACTONE) 25 MG tablet Take 1 tablet (25 mg total) by mouth daily. 09/21/18   Shelly Coss, MD     Critical care time: 13 mins    Kennieth Rad, MSN, AGACNP-BC  Pulmonary & Critical Care Pgr: [phone] or if no answer [phone] 10/14/2018, 9:30 PM

## 2018-10-03 NOTE — Consult Note (Signed)
Neurology Consultation Reason for Consult: Altered mental status Referring Physician: Hillard Danker  CC: Altered mental status  History is obtained from: Nursing home staff  HPI: Carrie Chaney is a 78 y.o. female with a history of recent admission for generalized weakness and anasarca which led to the new diagnosis of adenocarcinoma.  Since discharge, she has been doing extremely poorly at the nursing home, she does not really even help nursing home staff to turn herself.  She is completely bedbound and total care.  Today, it was noticed that she was slurring her speech more than typical and therefore a code stroke was activated.   LKW: 1 PM tpa given?: no, outside of window  ROS: A 14 point ROS was performed and is negative except as noted in the HPI.   Past Medical History:  Diagnosis Date  . Acute kidney failure ([place])   . Diabetes mellitus without complication ([place])   . Generalized edema   . Hyperlipemia   . Hypertension   . Secondary malignant neoplasm of unspecified site [facility])      Family History  Problem Relation Age of Onset  . Hypertension Mother   . Diabetes Other   . Cancer Neg Hx      Social History:  reports that she quit smoking about 6 weeks ago. She smoked 0.25 packs per day. She has never used smokeless tobacco. She reports that she does not drink alcohol or use drugs.   Exam: Current vital signs: BP 108/61   Pulse 97   Temp (S) 98.9 F (37.2 C) (Rectal)   Resp (!) 22   SpO2 94%  Vital signs in last 24 hours: Temp:  [98.9 F (37.2 C)] 98.9 F (37.2 C) (03/16 1823) Pulse Rate:  [95-101] 97 (03/16 1930) Resp:  [21-26] 22 (03/16 1930) BP: (100-108)/(48-93) 108/61 (03/16 1915) SpO2:  [94 %-95 %] 94 % (03/16 1930)   Physical Exam  Constitutional: Appears well-developed and well-nourished.  Psych: Affect appropriate to situation Eyes: No scleral injection HENT: No OP obstrucion Head: Normocephalic.  Cardiovascular: Normal rate and regular  rhythm.  Respiratory: Effort normal, non-labored breathing GI: Soft.  No distension. There is no tenderness.  Skin: She has multiple petechiae as well as multiple wounds including a bleeding wound underneath her left arm.  Neuro: Mental Status: Patient is awake, alert, oriented to person, but does not give me the month or year.  She is dysarthric, but is able to answer simple questions.   Cranial Nerves: II: Visual Fields are full. Pupils are equal, round, and reactive to light.   III,IV, VI: EOMI without ptosis or diploplia.  V: Facial sensation is symmetric to temperature VII: Facial movement is symmetric.  VIII: hearing is intact to voice Motor: She is flaccid throughout, able to wiggle fingers and wiggle toes, but diffusely severely weak. Sensory: Sensation is symmetric to light touch and temperature in the arms and legs. Cerebellar: She does not perform  I have reviewed labs in epic and the results pertinent to this consultation are: CMP-potassium of 6.6 Creatinine at 3.1 Calcium is 7.8 Total bilirubin of 2.8 Acidotic with a CO2 of 15 Thrombocytopenia with platelets of 9  I have reviewed the images obtained: CT head-unremarkable  Impression: 78 year old female with encephalopathy in the setting of multiple medical issues which I think is a more likely etiology than new stroke.  Her weakness has been going on for multiple weeks.  I agree with considering TTP in the setting of severe new onset thrombocytopenia,  renal dysfunction and neurological issues.  Also given that she has not been eating well for 2 months I would also favor sending a thiamine prior to thiamine repletion, though I think this is very low likelihood in etiology.  Recommendations: 1) would consider hematology consultation 2) MRI brain 3) treatment of her underlying medical issues 4) thiamine level followed by IV repletion 5) given her widely spread metastatic cancer and extremely poor functional status, I  doubt that she is a candidate for any type of aggressive oncologic intervention.  I would favor discussing with heme-onc, but she may need palliative care consultation with this hospitalization.   Roland Rack, MD [facility] [phone]  If 7pm- 7am, please page neurology on call as listed in [place].

## 2018-10-03 NOTE — Progress Notes (Signed)
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 By that time the patient had been discharged to an SNF. A follow-up w Carrie Chaney was scheduled for tomorrow, 03/17/ to develop a definite treatment plan.  According to the patient's sister however Carrie Chaney never fully regained her functional status after the February admission. She ate poorly or not at all and became progressively confused. When the sister Carrie Chaney) last saw the patient, 09/28/2018 she was still alert and conversant.   Today however the patient was found to be febrile, confused and possibly dehydrted secondary to diarrhea. In the ED she was found to be in ARF and severely thrombocytopenic, with worsening liver function. She was cultured, antibiotics were started, and we were consulted to evaluate for possible TTP.  The patient's subsequent history is as detailed below.  INTERVAL HISTORY: I met with the patient in her ED room, her sister being present  REVIEW OF SYSTEMS: The patient is unable to provide a ROS. The patient's sister last saw her six days ago. Otherwise refer to Carrie Chaney initial note for details for SNF personnel  PAST MEDICAL HISTORY: Past Medical History:  Diagnosis Date   Acute kidney failure ([place])    Diabetes mellitus without complication ([place])    Generalized edema     Hyperlipemia    Hypertension    Secondary malignant neoplasm of unspecified site [facility])     PAST SURGICAL HISTORY: Past Surgical History:  Procedure Laterality Date   ABDOMINAL HYSTERECTOMY     She is unsure if her ovaries were removed.    BIOPSY  09/16/2018   Procedure: BIOPSY;  Surgeon: Otis Brace, MD;  Location: [facility];  Service: Gastroenterology;;  EGD and Colon   COLONOSCOPY WITH PROPOFOL N/A 09/16/2018   Procedure: COLONOSCOPY WITH PROPOFOL;  Surgeon: Otis Brace, MD;  Location: [facility];  Service: Gastroenterology;  Laterality: N/A;   ESOPHAGOGASTRODUODENOSCOPY (EGD) WITH PROPOFOL N/A 09/16/2018   Procedure: ESOPHAGOGASTRODUODENOSCOPY (EGD) WITH PROPOFOL;  Surgeon: Otis Brace, MD;  Location: [facility];  Service: Gastroenterology;  Laterality: N/A;    FAMILY HISTORY Family History  Problem Relation Age of Onset   Hypertension Mother    Diabetes Other    Cancer Neg Hx     SOCIAL HISTORY:  The patient "is married but has lived by herself for 40 years."     ADVANCED DIRECTIVES: the patient's sister, Carrie Chaney, is her 19; she can be reached at [place]: Social History   Tobacco Use   Smoking status: Former Smoker    Packs/day: 0.25    Last attempt to quit: 08/22/2018    Years since quitting: 0.1   Smokeless tobacco: Never Used   Tobacco comment: one a day  Substance Use Topics   Alcohol use: No   Drug use: No     Allergies  Allergen Reactions   Tomato Other (See Comments)  Breaks out gums    Current Facility-Administered Medications  Medication Dose Route Frequency Provider Last Rate Last Dose   [START ON 10/31/18] cefTRIAXone (ROCEPHIN) 1 g in sodium chloride 0.9 % 100 mL IVPB  1 g Intravenous Q24H Renee Pain, MD       dextrose 5 % 1,000 mL with sodium bicarbonate 150 mEq infusion   Intravenous Continuous Renee Pain, MD       furosemide (LASIX) injection 80 mg  80 mg  Intravenous Once Renee Pain, MD       insulin aspart (novoLOG) injection 0-9 Units  0-9 Units Subcutaneous Q4H Jennelle Human B, NP       insulin aspart (novoLOG) injection 0-9 Units  0-9 Units Subcutaneous Q4H Renee Pain, MD       pantoprazole (PROTONIX) injection 40 mg  40 mg Intravenous QHS Renee Pain, MD       sodium chloride 0.9 % bolus 1,000 mL  1,000 mL Intravenous Continuous Renee Pain, MD 999 mL/hr at 10/17/2018 2106     sodium zirconium cyclosilicate (LOKELMA) packet 5 g  5 g Oral Once Dorie Rank, MD       Derrill Memo ON 2018-10-31] thiamine (B-1) injection 100 mg  100 mg Intravenous Daily Greta Doom, MD       Current Outpatient Medications  Medication Sig Dispense Refill   amLODipine (NORVASC) 5 MG tablet Take 1 tablet (5 mg total) by mouth daily.     Ensure Plus (ENSURE PLUS) LIQD Take 237 mLs by mouth 2 (two) times daily between meals.     furosemide (LASIX) 80 MG tablet Take 1 tablet (80 mg total) by mouth 2 (two) times daily. (Patient taking differently: Take 80 mg by mouth daily. )     hydrALAZINE (APRESOLINE) 50 MG tablet Take 1 tablet (50 mg total) by mouth every 8 (eight) hours.     Multiple Vitamin (MULTIVITAMIN WITH MINERALS) TABS tablet Take 1 tablet by mouth daily.     potassium chloride SA (K-DUR,KLOR-CON) 20 MEQ tablet Take 2 tablets (40 mEq total) by mouth 2 (two) times daily.     spironolactone (ALDACTONE) 25 MG tablet Take 1 tablet (25 mg total) by mouth daily.      OBJECTIVE: older African American woman examined in bed  Vitals:   09/23/2018 2030 10/02/2018 2100  BP: (!) 108/44 (!) 116/42  Pulse: 96 100  Resp: (!) 23 (!) 31  Temp:    SpO2: 95% 95%     There is no height or weight on file to calculate BMI.   Wt Readings from Last 3 Encounters:  09/20/18 254 lb (115.2 kg)  08/15/16 (!) 406 lb 1.4 oz (184.2 kg)        Ocular: EOMs intactl Lungs no rales or rhonchi--auscultated anterolaterally Heart regular rate and  rhythm Abd soft, obese MSK no obvious edema or anasarca  Neuro:does not answer questions or follow commands Breasts: deferred   LAB RESULTS:  CMP     Component Value Date/Time   NA 142 09/20/2018 1748   K 6.6 (HH) 10/14/2018 1748   CL 112 (H) 09/20/2018 1748   CO2 15 (L) 09/30/2018 1748   GLUCOSE 144 (H) 10/01/2018 1748   BUN 112 (H) 10/02/2018 1748   CREATININE 3.00 (H) 10/07/2018 1753   CALCIUM 7.8 (L) 09/28/2018 1748   PROT 5.2 (L) 10/16/2018 1748   ALBUMIN 1.2 (L) 10/10/2018 1748   AST 121 (H) 09/22/2018 1748   ALT 199 (H) 09/25/2018 1748   ALKPHOS 304 (  H) 10/11/2018 1748   BILITOT 2.8 (H) 10/08/2018 1748   GFRNONAA 14 (L) 09/29/2018 1748   GFRAA 16 (L) 09/26/2018 1748    No results found for: TOTALPROTELP, ALBUMINELP, A1GS, A2GS, BETS, BETA2SER, GAMS, MSPIKE, SPEI  No results found for: KPAFRELGTCHN, LAMBDASER, KAPLAMBRATIO  Lab Results  Component Value Date   WBC 6.7 10/02/2018   NEUTROABS 5.9 10/02/2018   HGB 11.2 (L) 09/26/2018   HCT 34.6 (L) 10/13/2018   MCV 86.5 10/17/2018   PLT 12 (LL) 10/12/2018    '@LASTCHEMISTRY' @  No results found for: LABCA2  No components found for: DPOEUM353  Recent Labs  Lab 09/20/2018 1937  INR 1.5*    No results found for: LABCA2  Lab Results  Component Value Date   IRW431 1 09/10/2018    No results found for: VQM086  No results found for: PYP950  No results found for: CA2729  No components found for: HGQUANT  Lab Results  Component Value Date   CEA1 10.1 (H) 09/10/2018   /  CEA  Date Value Ref Range Status  09/10/2018 10.1 (H) 0.0 - 4.7 ng/mL Final    Comment:    (NOTE)                             Nonsmokers          <3.9                             Smokers             <5.6 [employer] Electrochemiluminescence Immunoassay (ECLIA) Values obtained with different assay methods or kits cannot be used interchangeably.  Results cannot be interpreted as absolute evidence of the presence or absence  of malignant disease. Performed At: [facility] [place], [place] [id number] Rush Farmer MD YK:[id number]      Lab Results  Component Value Date   AFPTUMOR 2.3 09/10/2018    No results found for: CHROMOGRNA  No results found for: PSA1  Admission on 10/13/2018  Component Date Value Ref Range Status   Creatinine, Ser 09/23/2018 3.00* 0.44 - 1.00 mg/dL Final   Alcohol, Ethyl (B) 09/21/2018 <10  <10 mg/dL Final   Comment: (NOTE) Lowest detectable limit for serum alcohol is 10 mg/dL. For medical purposes only. Performed at [facility], [place] [address], [place], [place] [address]    Prothrombin Time 10/11/2018 16.9* 11.4 - 15.2 seconds Final   INR 10/05/2018 1.4* 0.8 - 1.2 Final   Comment: (NOTE) INR goal varies based on device and disease states. Performed at [facility], [place] [address], [place], [place] [address]    aPTT 10/06/2018 28  24 - 36 seconds Final   Performed at [place] [address] [place], [place] [address]   WBC 09/23/2018 6.7  4.0 - 10.5 K/uL Final   RBC 10/10/2018 4.00  3.87 - 5.11 MIL/uL Final   Hemoglobin 10/14/2018 11.2* 12.0 - 15.0 g/dL Final   HCT 09/18/2018 34.6* 36.0 - 46.0 % Final   MCV 10/01/2018 86.5  80.0 - 100.0 fL Final   MCH 10/17/2018 28.0  26.0 - 34.0 pg Final   MCHC 10/11/2018 32.4  30.0 - 36.0 g/dL Final   RDW 10/18/2018 20.6* 11.5 - 15.5 % Final   Platelets 10/05/2018 9* 150 - 400 K/uL Final   Comment: REPEATED TO VERIFY PLATELET  COUNT CONFIRMED BY SMEAR SPECIMEN CHECKED FOR CLOTS Immature Platelet Fraction may be clinically indicated, consider ordering this additional test [id number]    nRBC 10/18/2018 2.9* 0.0 - 0.2 % Final   Performed at [facility], [place] [address], [place], [place] [address]   Neutrophils Relative % 10/02/2018 88  % Final   Neutro Abs 09/26/2018 5.9  1.7 - 7.7 K/uL Final   Lymphocytes Relative 10/02/2018 4  % Final   Lymphs Abs  10/15/2018 0.3* 0.7 - 4.0 K/uL Final   Monocytes Relative 09/21/2018 2  % Final   Monocytes Absolute 09/22/2018 0.1  0.1 - 1.0 K/uL Final   Eosinophils Relative 09/20/2018 4  % Final   Eosinophils Absolute 10/09/2018 0.3  0.0 - 0.5 K/uL Final   Basophils Relative 09/21/2018 1  % Final   Basophils Absolute 10/14/2018 0.0  0.0 - 0.1 K/uL Final   Immature Granulocytes 09/21/2018 1  % Final   Abs Immature Granulocytes 10/05/2018 0.08* 0.00 - 0.07 K/uL Final   Performed at [facility], [place] [address] [place], [place] [address]   Sodium 10/07/2018 142  135 - 145 mmol/L Final   Potassium 10/18/2018 6.6* 3.5 - 5.1 mmol/L Corrected   Comment: CRITICAL RESULT CALLED TO, READ BACK BY AND VERIFIED WITH: Veneda Melter [id number] 10/08/2018 WBOND NO VISIBLE HEMOLYSIS CALLED AT 1833 CORRECTED ON 03/16 AT 2113: PREVIOUSLY REPORTED AS 6.6 CRITICAL RESULT CALLED TO, READ BACK BY AND VERIFIED WITH: Veneda Melter [id number] 10/15/2018 WBOND NO VISIBLE HEMOLYSIS    Chloride 09/25/2018 112* 98 - 111 mmol/L Final   CO2 10/09/2018 15* 22 - 32 mmol/L Final   Glucose, Bld 10/07/2018 144* 70 - 99 mg/dL Final   BUN 10/13/2018 112* 8 - 23 mg/dL Final   Creatinine, Ser 10/06/2018 3.11* 0.44 - 1.00 mg/dL Final   Calcium 10/09/2018 7.8* 8.9 - 10.3 mg/dL Final   Total Protein 10/14/2018 5.2* 6.5 - 8.1 g/dL Final   Albumin 09/28/2018 1.2* 3.5 - 5.0 g/dL Final   AST 10/15/2018 121* 15 - 41 U/L Final   ALT 09/30/2018 199* 0 - 44 U/L Final   Alkaline Phosphatase 10/09/2018 304* 38 - 126 U/L Final   Total Bilirubin 09/22/2018 2.8* 0.3 - 1.2 mg/dL Final   GFR calc non Af Amer 10/11/2018 14* >60 mL/min Final   GFR calc Af Amer 10/18/2018 16* >60 mL/min Final   Anion gap 10/01/2018 15  5 - 15 Final   Performed at [facility], [place] [address] [place], [place] [address]   Opiates 09/28/2018 NONE DETECTED  NONE DETECTED Final   Cocaine 10/17/2018 NONE DETECTED  NONE DETECTED Final    Benzodiazepines 10/01/2018 NONE DETECTED  NONE DETECTED Final   Amphetamines 09/24/2018 NONE DETECTED  NONE DETECTED Final   Tetrahydrocannabinol 10/05/2018 NONE DETECTED  NONE DETECTED Final   Barbiturates 10/02/2018 NONE DETECTED  NONE DETECTED Final   Comment: (NOTE) DRUG SCREEN FOR MEDICAL PURPOSES ONLY.  IF CONFIRMATION IS NEEDED FOR ANY PURPOSE, NOTIFY LAB WITHIN 5 DAYS. LOWEST DETECTABLE LIMITS FOR URINE DRUG SCREEN Drug Class                     Cutoff (ng/mL) Amphetamine and metabolites    1000 Barbiturate and metabolites    200 Benzodiazepine                 707 Tricyclics and metabolites     300 Opiates and metabolites  300 Cocaine and metabolites        300 THC                            50 Performed at [facility], [place] [address], [place], [place] [address]    Color, Urine 09/24/2018 AMBER* YELLOW Final   BIOCHEMICALS MAY BE AFFECTED BY COLOR   APPearance 10/10/2018 CLOUDY* CLEAR Final   Specific Gravity, Urine 10/01/2018 1.017  1.005 - 1.030 Final   pH 09/21/2018 6.0  5.0 - 8.0 Final   Glucose, UA 10/16/2018 NEGATIVE  NEGATIVE mg/dL Final   Hgb urine dipstick 10/02/2018 MODERATE* NEGATIVE Final   Bilirubin Urine 09/28/2018 SMALL* NEGATIVE Final   Ketones, ur 09/26/2018 NEGATIVE  NEGATIVE mg/dL Final   Protein, ur 10/12/2018 30* NEGATIVE mg/dL Final   Nitrite 10/01/2018 NEGATIVE  NEGATIVE Final   Leukocytes,Ua 10/08/2018 MODERATE* NEGATIVE Final   RBC / HPF 10/15/2018 21-50  0 - 5 RBC/hpf Final   WBC, UA 10/05/2018 >50* 0 - 5 WBC/hpf Final   Bacteria, UA 09/26/2018 MANY* NONE SEEN Final   Squamous Epithelial / LPF 10/09/2018 0-5  0 - 5 Final   WBC Clumps 09/27/2018 PRESENT   Final   Mucus 10/07/2018 PRESENT   Final   Non Squamous Epithelial 10/18/2018 11-20* NONE SEEN Final   Performed at [facility], [place] [address], [place], [place] [address]   Prothrombin Time 09/21/2018 17.8* 11.4 - 15.2 seconds Final   REPEATED TO  VERIFY   INR 09/30/2018 1.5* 0.8 - 1.2 Final   REPEATED TO VERIFY   aPTT 10/06/2018 30  24 - 36 seconds Final   Fibrinogen 10/16/2018 468  210 - 475 mg/dL Final   D-Dimer, Quant 10/08/2018 12.86* 0.00 - 0.50 ug/mL-FEU Final   Comment: REPEATED TO VERIFY (NOTE) At the manufacturer cut-off of 0.50 ug/mL FEU, this assay has been documented to exclude PE with a sensitivity and negative predictive value of 97 to 99%.  At this time, this assay has not been approved by the FDA to exclude DVT/VTE. Results should be correlated with clinical presentation.    Platelets 10/07/2018 12* 150 - 400 K/uL Final   Comment: REPEATED TO VERIFY Immature Platelet Fraction may be clinically indicated, consider ordering this additional test [id number] CRITICAL VALUE NOTED.  VALUE IS CONSISTENT WITH PREVIOUSLY REPORTED AND CALLED VALUE. Performed at [facility], [place] [address], [place], [place] [address]    Smear Review 10/09/2018 PENDING   Incomplete   Lactic Acid, Venous 09/24/2018 4.5* 0.5 - 1.9 mmol/L Final   Comment: CRITICAL RESULT CALLED TO, READ BACK BY AND VERIFIED WITH: Rochele Raring 2008 10/12/2018 D BRADLEY Performed at [place] [address], [place], [place] [address]    Smear Review 10/01/2018 SMEAR STAINED AND AVAILABLE FOR REVIEW   Final   Performed at [facility], [place] [address] [place], [place] [address]    (this displays the last labs from the last 3 days)  No results found for: TOTALPROTELP, ALBUMINELP, A1GS, A2GS, BETS, BETA2SER, GAMS, MSPIKE, SPEI (this displays SPEP labs)  No results found for: KPAFRELGTCHN, LAMBDASER, KAPLAMBRATIO (kappa/lambda light chains)  No results found for: HGBA, HGBA2QUANT, HGBFQUANT, HGBSQUAN (Hemoglobinopathy evaluation)   No results found for: LDH  No results found for: IRON, TIBC, IRONPCTSAT (Iron and TIBC)  No results found for: FERRITIN  Urinalysis    Component Value Date/Time   COLORURINE AMBER (A)  10/02/2018 1824   APPEARANCEUR CLOUDY (A) 09/27/2018 1824   LABSPEC 1.017 09/19/2018 1824   PHURINE 6.0 10/01/2018 1824   GLUCOSEU NEGATIVE 10/09/2018 1824   HGBUR MODERATE (A) 10/11/2018 1824   BILIRUBINUR SMALL (A) 10/02/2018 [place] 10/17/2018 1824   PROTEINUR 30 (A) 10/01/2018 1824   NITRITE NEGATIVE 10/15/2018 1824   LEUKOCYTESUR MODERATE (A) 10/13/2018 1824     STUDIES: Ct Angio Head W Or Wo Contrast  Result Date: 10/16/2018 CLINICAL DATA:  Code stroke. Altered mental status. EXAM: CT ANGIOGRAPHY HEAD AND NECK TECHNIQUE: Multidetector CT imaging of the head and neck was performed using the standard protocol during bolus administration of intravenous contrast. Multiplanar CT image reconstructions and MIPs were obtained to evaluate the vascular anatomy. Carotid stenosis measurements (when applicable) are obtained utilizing NASCET criteria, using the distal internal carotid diameter as the denominator. CONTRAST:  64m OMNIPAQUE IOHEXOL 350 MG/ML SOLN COMPARISON:  Code stroke CT head earlier today. FINDINGS: CTA NECK FINDINGS Aortic arch: Standard branching. Imaged portion shows no evidence of aneurysm or dissection. 50-75% stenosis of the proximal LEFT subclavian, otherwise no significant stenosis of the major arch vessel origins. Right carotid system: No evidence of dissection, stenosis (50% or greater) or occlusion. Calcific atheromatous change at the bifurcation. Left carotid system: No evidence of dissection, stenosis (50% or greater) or occlusion. Calcific atheromatous change at the bifurcation. Vertebral arteries: Dominant RIGHT vertebral. 50-75% stenosis LEFT vertebral origin, with adjacent ostial calcification. Skeleton: Spondylosis. Other neck: Noncontributory. Upper chest: Superior segment RIGHT lower lobe atelectasis. subpleural blebs along the medial RIGHT upper lobe. Review of the MIP images confirms the above findings CTA HEAD FINDINGS Anterior circulation: No  significant stenosis, proximal occlusion, aneurysm, or vascular malformation. Posterior circulation: No significant stenosis, proximal occlusion, aneurysm, or vascular malformation. 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 Venous sinuses: As permitted by contrast timing, patent. Anatomic variants: BILATERAL fetal PCA. Delayed phase: Not performed. Review of the MIP images confirms the above findings IMPRESSION: 1. No intracranial or extracranial anterior circulation stenosis of significance. 2. 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 4. Subpleural blebs along the medial RIGHT upper lobe. Superior segment RIGHT lower lobe atelectasis. 5. Based on the decision of the ordering provider at the scanner, CT perfusion was canceled. These results were communicated to Carrie. KLeonel Ramsayat 6[facility] Centerpmon 03/13/2020by text page via the AP[facility] Hospital Of Tulsamessaging system. Electronically Signed   By: JStaci RighterM.D.   On: 10/12/2018 18:26   Dg Chest 2 View  Result Date: 09/07/2018 CLINICAL DATA:  Swelling of the left lower extremity for a month. Dyspnea. EXAM: CHEST - 2 VIEW COMPARISON:  None. FINDINGS: Mild cardiomegaly with aortic atherosclerosis. Central vascular congestion consistent with mild CHF is noted. Atelectasis is seen at the right lung base. IMPRESSION: Cardiomegaly with aortic atherosclerosis. Central vascular congestion consistent with mild CHF. Electronically Signed   By: DAshley RoyaltyM.D.   On: 09/07/2018 20:01   Ct Angio Neck W Or Wo Contrast  Result Date: 10/10/2018 CLINICAL DATA:  Code stroke. Altered mental status. EXAM: CT ANGIOGRAPHY HEAD AND NECK TECHNIQUE: Multidetector CT imaging of the head and neck was performed using the standard protocol during bolus administration of intravenous  contrast. Multiplanar CT image reconstructions and MIPs were obtained to evaluate the vascular anatomy. Carotid stenosis measurements (when applicable) are obtained utilizing  NASCET criteria, using the distal internal carotid diameter as the denominator. CONTRAST:  76m OMNIPAQUE IOHEXOL 350 MG/ML SOLN COMPARISON:  Code stroke CT head earlier today. FINDINGS: CTA NECK FINDINGS Aortic arch: Standard branching. Imaged portion shows no evidence of aneurysm or dissection. 50-75% stenosis of the proximal LEFT subclavian, otherwise no significant stenosis of the major arch vessel origins. Right carotid system: No evidence of dissection, stenosis (50% or greater) or occlusion. Calcific atheromatous change at the bifurcation. Left carotid system: No evidence of dissection, stenosis (50% or greater) or occlusion. Calcific atheromatous change at the bifurcation. Vertebral arteries: Dominant RIGHT vertebral. 50-75% stenosis LEFT vertebral origin, with adjacent ostial calcification. Skeleton: Spondylosis. Other neck: Noncontributory. Upper chest: Superior segment RIGHT lower lobe atelectasis. subpleural blebs along the medial RIGHT upper lobe. Review of the MIP images confirms the above findings CTA HEAD FINDINGS Anterior circulation: No significant stenosis, proximal occlusion, aneurysm, or vascular malformation. Posterior circulation: No significant stenosis, proximal occlusion, aneurysm, or vascular malformation. 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 Venous sinuses: As permitted by contrast timing, patent. Anatomic variants: BILATERAL fetal PCA. Delayed phase: Not performed. Review of the MIP images confirms the above findings IMPRESSION: 1. No intracranial or extracranial anterior circulation stenosis of significance. 2. 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 50-75%  stenosis of the LEFT subclavian origin and LEFT vertebral origin. 4. Subpleural blebs along the medial RIGHT upper lobe. Superior segment RIGHT lower lobe atelectasis. 5. Based on the decision of the ordering provider at the scanner, CT perfusion was canceled. These results were communicated to Carrie. KLeonel Ramsayat 6[facility] Hospitalpmon 03/09/2020by text page via the AL[facility] LLCmessaging system. Electronically Signed   By: JStaci RighterM.D.   On: 09/28/2018 18:26   Ct Chest W Contrast  Result Date: 09/08/2018 CLINICAL DATA:  Liver masses on ultrasound earlier today.  Anasarca. EXAM: CT CHEST, ABDOMEN, AND PELVIS WITH CONTRAST TECHNIQUE: Multidetector CT imaging of the chest, abdomen and pelvis was performed following the standard protocol during bolus administration of intravenous contrast. CONTRAST:  1018mOMNIPAQUE IOHEXOL 300 MG/ML SOLN, 3041mMNIPAQUE IOHEXOL 300 MG/ML SOLN COMPARISON:  Ultrasound exam 09/08/2018 FINDINGS: CT CHEST FINDINGS Cardiovascular: The heart is enlarged. Coronary artery calcification is evident. Atherosclerotic calcification is noted in the wall of the thoracic aorta. Mediastinum/Nodes: No mediastinal lymphadenopathy. There is no hilar lymphadenopathy. There is no axillary lymphadenopathy. The esophagus has normal imaging features. Lungs/Pleura: The central tracheobronchial airways are patent. Subsegmental atelectasis noted in the dependent lower lungs bilaterally. No suspicious pulmonary nodule or mass. No substantial pleural effusion. Musculoskeletal: No worrisome lytic or sclerotic osseous abnormality. CT ABDOMEN PELVIS FINDINGS Hepatobiliary: As seen on the recent ultrasound exam, there are multiple liver masses, bulky year in the right lobe and in the left. Index lesion in the medial right liver measures 6.9 x 5.0 cm on 54/2. Index lesion in the medial segment left liver measures 2.5 cm. A large exophytic lesion seen from the inferior right liver extends into Morison's pouch and generates  substantial mass-effect on the right kidney. There is no evidence for gallstones, gallbladder wall thickening, or pericholecystic fluid. No intrahepatic or extrahepatic biliary dilation. Pancreas: No focal mass lesion. No dilatation of the main duct. No intraparenchymal cyst. No peripancreatic edema. Spleen: No splenomegaly. No focal mass lesion. Adrenals/Urinary Tract: Right adrenal gland not visualized. Left adrenal gland unremarkable. 4.1 cm cyst noted in the upper pole left kidney. The mass  in the inferior right liver extending down to the upper pole the right kidney does generate mass-effect on the right kidney but in some regions, appears to extend into the upper pole parenchyma of the right kidney (see coronal image 96 of series 5). 7 clear whether this is an exophytic mass arising from the upper right kidney or potentially hepatic origin invading the upper pole the right kidney. No evidence for hydroureter. The urinary bladder appears normal for the degree of distention. Stomach/Bowel: Stomach is unremarkable. No gastric wall thickening. No evidence of outlet obstruction. Duodenum is normally positioned as is the ligament of Treitz. No small bowel wall thickening. No small bowel dilatation. The terminal ileum is normal. The appendix is not visualized, but there is no edema or inflammation in the region of the cecum. Right colon and transverse colon are unremarkable. Diverticular changes are noted in the left colon. 3.4 x 3.8 cm soft tissue mass is identified along the sigmoid colon (99/2). Vascular/Lymphatic: There is abdominal aortic atherosclerosis without aneurysm. IVC is displaced to the left by the inferior right hepatic disease. 1.6 cm short axis hepato duodenal ligament lymph node evident on 58/2. 1.9 cm lymph node in the porta hepatis visible on 62/2. 10 mm short axis aortocaval lymph node visible on 72/2. No pelvic sidewall lymphadenopathy. Reproductive: Uterus surgically absent.  There is no  adnexal mass. Other: Small volume free fluid seen adjacent to the liver and spleen as well as in the cul-de-sac. 16 mm perigastric nodule noted on 67/2. Similar adjacent nodule measures up to 2.0 cm. Multiple omental nodules are evident including 2.0 x 1.4 cm left omental nodule on 88/2. Musculoskeletal: No worrisome lytic or sclerotic osseous abnormality. IMPRESSION: 1. 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 2. Bulky metastatic disease is identified between the right liver in the upper pole the right kidney. This appears to involve both the liver and the kidney and is unclear whether this is primarily from the liver invading the upper pole the right kidney or potentially a right upper pole renal neoplasm invading the liver. 3. Perigastric, porta hepatis, and omental metastatic disease. Borderline enlarged retroperitoneal lymph nodes also suggest metastatic involvement. 4. 4 cm soft tissue mass associated with the sigmoid colon. While this could certainly be a primary colonic neoplasm, it has a severe coal configuration and does not have typical colorectal cancer CT imaging appearance. Given the metastatic disease elsewhere in the abdomen/pelvis, this could also represent metastatic disease in the pelvis. 5. Given the findings immediately above, no obvious primary source for the diffuse metastatic disease is evident. If the lesion along the sigmoid colon ends up being colorectal cancer, than the other findings in the abdomen and pelvis are certainly consistent with metastatic disease. Right adrenal or right renal cancer with direct liver invasion along with liver metastases, peritoneal/omental disease and metastatic lymphadenopathy would also be a consideration. 6. No evidence for metastatic disease in the thorax. 7. Aortic Atherosclerois (ICD10-170.0) Electronically Signed   By: Misty Stanley M.D.   On: 09/08/2018 18:30   Ct Abdomen Pelvis W  Contrast  Result Date: 09/08/2018 CLINICAL DATA:  Liver masses on ultrasound earlier today.  Anasarca. EXAM: CT CHEST, ABDOMEN, AND PELVIS WITH CONTRAST TECHNIQUE: Multidetector CT imaging of the chest, abdomen and pelvis was performed following the standard protocol during bolus administration of intravenous contrast. CONTRAST:  114m OMNIPAQUE IOHEXOL 300 MG/ML SOLN, 333mOMNIPAQUE IOHEXOL 300 MG/ML SOLN COMPARISON:  Ultrasound exam 09/08/2018  FINDINGS: CT CHEST FINDINGS Cardiovascular: The heart is enlarged. Coronary artery calcification is evident. Atherosclerotic calcification is noted in the wall of the thoracic aorta. Mediastinum/Nodes: No mediastinal lymphadenopathy. There is no hilar lymphadenopathy. There is no axillary lymphadenopathy. The esophagus has normal imaging features. Lungs/Pleura: The central tracheobronchial airways are patent. Subsegmental atelectasis noted in the dependent lower lungs bilaterally. No suspicious pulmonary nodule or mass. No substantial pleural effusion. Musculoskeletal: No worrisome lytic or sclerotic osseous abnormality. CT ABDOMEN PELVIS FINDINGS Hepatobiliary: As seen on the recent ultrasound exam, there are multiple liver masses, bulky year in the right lobe and in the left. Index lesion in the medial right liver measures 6.9 x 5.0 cm on 54/2. Index lesion in the medial segment left liver measures 2.5 cm. A large exophytic lesion seen from the inferior right liver extends into Morison's pouch and generates substantial mass-effect on the right kidney. There is no evidence for gallstones, gallbladder wall thickening, or pericholecystic fluid. No intrahepatic or extrahepatic biliary dilation. Pancreas: No focal mass lesion. No dilatation of the main duct. No intraparenchymal cyst. No peripancreatic edema. Spleen: No splenomegaly. No focal mass lesion. Adrenals/Urinary Tract: Right adrenal gland not visualized. Left adrenal gland unremarkable. 4.1 cm cyst noted in the upper  pole left kidney. The mass in the inferior right liver extending down to the upper pole the right kidney does generate mass-effect on the right kidney but in some regions, appears to extend into the upper pole parenchyma of the right kidney (see coronal image 96 of series 5). 7 clear whether this is an exophytic mass arising from the upper right kidney or potentially hepatic origin invading the upper pole the right kidney. No evidence for hydroureter. The urinary bladder appears normal for the degree of distention. Stomach/Bowel: Stomach is unremarkable. No gastric wall thickening. No evidence of outlet obstruction. Duodenum is normally positioned as is the ligament of Treitz. No small bowel wall thickening. No small bowel dilatation. The terminal ileum is normal. The appendix is not visualized, but there is no edema or inflammation in the region of the cecum. Right colon and transverse colon are unremarkable. Diverticular changes are noted in the left colon. 3.4 x 3.8 cm soft tissue mass is identified along the sigmoid colon (99/2). Vascular/Lymphatic: There is abdominal aortic atherosclerosis without aneurysm. IVC is displaced to the left by the inferior right hepatic disease. 1.6 cm short axis hepato duodenal ligament lymph node evident on 58/2. 1.9 cm lymph node in the porta hepatis visible on 62/2. 10 mm short axis aortocaval lymph node visible on 72/2. No pelvic sidewall lymphadenopathy. Reproductive: Uterus surgically absent.  There is no adnexal mass. Other: Small volume free fluid seen adjacent to the liver and spleen as well as in the cul-de-sac. 16 mm perigastric nodule noted on 67/2. Similar adjacent nodule measures up to 2.0 cm. Multiple omental nodules are evident including 2.0 x 1.4 cm left omental nodule on 88/2. Musculoskeletal: No worrisome lytic or sclerotic osseous abnormality. IMPRESSION: 1. Bulky metastatic disease identified in the liver, with inferior right liver essentially replaced by  tumor. 2. Bulky metastatic disease is identified between the right liver in the upper pole the right kidney. This appears to involve both the liver and the kidney and is unclear whether this is primarily from the liver invading the upper pole the right kidney or potentially a right upper pole renal neoplasm invading the liver. 3. Perigastric, porta hepatis, and omental metastatic disease. Borderline enlarged retroperitoneal lymph nodes also suggest metastatic involvement. 4.  4 cm soft tissue mass associated with the sigmoid colon. While this could certainly be a primary colonic neoplasm, it has a severe coal configuration and does not have typical colorectal cancer CT imaging appearance. Given the metastatic disease elsewhere in the abdomen/pelvis, this could also represent metastatic disease in the pelvis. 5. Given the findings immediately above, no obvious primary source for the diffuse metastatic disease is evident. If the lesion along the sigmoid colon ends up being colorectal cancer, than the other findings in the abdomen and pelvis are certainly consistent with metastatic disease. Right adrenal or right renal cancer with direct liver invasion along with liver metastases, peritoneal/omental disease and metastatic lymphadenopathy would also be a consideration. 6. No evidence for metastatic disease in the thorax. 7. Aortic Atherosclerois (ICD10-170.0) Electronically Signed   By: Misty Stanley M.D.   On: 09/08/2018 18:30   US Biopsy (liver)  Result Date: 09/09/2018 INDICATION: LIVER MASS, SUSPECT COLON PRIMARY EXAM: ULTRASOUND LEFT LIVER MASS BIOPSY MEDICATIONS: 1% lidocaine local ANESTHESIA/SEDATION: Moderate (conscious) sedation was employed during this procedure. A total of Versed 1.0 mg and Fentanyl 50 mcg was administered intravenously. Moderate Sedation Time: 10 minutes. The patient's level of consciousness and vital signs were monitored continuously by radiology nursing throughout the procedure under my  direct supervision. FLUOROSCOPY TIME:  Fluoroscopy Time: None. COMPLICATIONS: None immediate. PROCEDURE: Informed written consent was obtained from the patient after a thorough discussion of the procedural risks, benefits and alternatives. All questions were addressed. Maximal Sterile Barrier Technique was utilized including caps, mask, sterile gowns, sterile gloves, sterile drape, hand hygiene and skin antiseptic. A timeout was performed prior to the initiation of the procedure. Previous imaging reviewed. Patient positioned supine. Preliminary ultrasound performed. A left lobe lesion is demonstrated in the epigastric region. Overlying skin marked. Under sterile conditions and local anesthesia, a 17 gauge 6.8 cm access needle was advanced from an anterior epigastric approach to the lesion. Needle position confirmed with ultrasound. 18 gauge core biopsies obtained under direct ultrasound. Images obtained for documentation. Samples were placed in formalin. Needle removed. Patient tolerated the biopsy well. No immediate complication IMPRESSION: Successful ultrasound left liver mass 18 gauge core biopsy Electronically Signed   By: Jerilynn Mages.  Shick M.D.   On: 09/09/2018 14:40   Ct Head Code Stroke Wo Contrast  Result Date: 09/30/2018 CLINICAL DATA:  Code stroke.  Altered mental status. EXAM: CT HEAD WITHOUT CONTRAST TECHNIQUE: Contiguous axial images were obtained from the base of the skull through the vertex without intravenous contrast. COMPARISON:  08/14/2016 FINDINGS: Brain: There is no evidence of acute infarct, intracranial hemorrhage, mass, midline shift, or extra-axial fluid collection. A cavum septum pellucidum et vergae is incidentally noted. The ventricles and sulci are within normal limits for age. Vascular: Calcified atherosclerosis at the skull base. No hyperdense vessel. Skull: No fracture or focal osseous lesion. Sinuses/Orbits: Visualized paranasal sinuses and mastoid air cells are clear. Were bits are  unremarkable. Other: None. ASPECTS [facility] Stroke Program Early CT Score) Not scored with this history. IMPRESSION: No evidence of acute intracranial abnormality. These results were communicated to Carrie. Leonel Ramsay at 5:40 pm on 09/28/2018 by text page via the [facility] messaging system. Electronically Signed   By: Logan Bores M.D.   On: 10/16/2018 17:41   Vas [place] Upper Extremity Venous Duplex  Result Date: 09/12/2018 UPPER VENOUS STUDY  Indications: Swelling Performing Technologist: Oliver Hum RVT  Examination Guidelines: A complete evaluation includes B-mode imaging, spectral Doppler, color Doppler, and power Doppler as needed of all  accessible portions of each vessel. Bilateral testing is considered an integral part of a complete examination. Limited examinations for reoccurring indications may be performed as noted.  Right Findings: +----------+------------+----------+---------+-----------+-------+  RIGHT      Compressible Properties Phasicity Spontaneous Summary  +----------+------------+----------+---------+-----------+-------+  IJV            Full                   Yes        Yes              +----------+------------+----------+---------+-----------+-------+  Subclavian     Full                   Yes        Yes              +----------+------------+----------+---------+-----------+-------+  Axillary       Full                   Yes        Yes              +----------+------------+----------+---------+-----------+-------+  Brachial       Full                   Yes        Yes              +----------+------------+----------+---------+-----------+-------+  Radial         Full                                               +----------+------------+----------+---------+-----------+-------+  Ulnar          Full                                               +----------+------------+----------+---------+-----------+-------+  Cephalic       Full                                                +----------+------------+----------+---------+-----------+-------+  Basilic        Full                                               +----------+------------+----------+---------+-----------+-------+  Left Findings: +----------+------------+----------+---------+-----------+-------+  LEFT       Compressible Properties Phasicity Spontaneous Summary  +----------+------------+----------+---------+-----------+-------+  Subclavian     Full                   Yes        Yes              +----------+------------+----------+---------+-----------+-------+  Summary:  Right: No evidence of deep vein thrombosis in the upper extremity. No evidence of superficial vein thrombosis in the upper extremity.  Left: No evidence of thrombosis in the subclavian.  *See table(s) above for measurements and observations.  Diagnosing physician: Ruta Hinds MD Electronically signed by Ruta Hinds MD on 09/12/2018  at 3:30:20 PM.    Final    US Abdomen Limited Ruq  Result Date: 09/08/2018 CLINICAL DATA:  78 year old female with abnormal LFTs.  Anasarca. EXAM: ULTRASOUND ABDOMEN LIMITED RIGHT UPPER QUADRANT COMPARISON:  None. FINDINGS: Gallbladder: No gallstones or wall thickening visualized. No sonographic Murphy sign noted by sonographer. Common bile duct: Diameter: 4 millimeters, normal. Liver: Abnormal, highly irregular and heterogeneous liver echogenicity suggesting multiple liver masses ranging from 2.5 centimeters to 10.5 centimeters diameter (images 18, 35, 43). The liver contour seems to remain relatively smooth (image 31). Portal vein is patent on color Doppler imaging with normal direction of blood flow towards the liver. Other findings: No ascites. IMPRESSION: Positive for multiple liver masses without obvious cirrhosis. Favor metastatic disease over primary liver tumor. CT chest abdomen and pelvis (oral and IV contrast preferred) recommended. Electronically Signed   By: Genevie Ann M.D.   On: 09/08/2018 02:20   REVIEW OF BLOOD  FILM: Here are schistocytes though relatively few; many red cell fragments; moderate anisocytosis, rouleaux, no NRBCs and no significant polychromasia; RBCs slightly left shifted (bands); thrombocytopenia confirmed, with no artefactual clumps  ASSESSMENT: 78 y.o. 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   PLAN: I discussed the situation with the patient's sister, who is also her 65. The DIC panel shows a normal fibrinogen and APTT, not suggestive of DIC. However the INR is mildly elevated (was WNL 2 weeks ago) and the DDimer is markedly up. The INR may be explained by the patient's liver involvement by tumor and possibly by the patient's severe malnutrition (per sister's account). The DDimer is generally unremarkable in uncomplicated TTP and therefore we are dealing with other issues in Carrie Cortton's case,  which could well be infectious or cancer related-- in short an element of DIC seems likely as well.  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 She will need a pheresis/dialysis catheter and I have written the orders to start pheresis in AM (a tech is being called in).  While risk of death from untreated TTP is in the 80% range, patients who tolerate pheresis can turn around relatively quickly and the problem need not recur. 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 For this reason a palliative care consult is appropriate and I will initiate it. Carrie Chaney will be addressing the treatment options for the patient's cancer and its prognosis once the patient sufficiently recovers  We will follow closely with you.   Chauncey Cruel, MD   09/24/2018 9:16 PM Medical Oncology and  Hematology [facility] [address] [place], [place] [address] Tel. [phone]    Fax. [phone]

## 2018-10-03 NOTE — ED Provider Notes (Addendum)
[facility] EMERGENCY DEPARTMENT Provider Note   CSN: [id number] Arrival date & time: 09/23/2018  1724    History   Chief Complaint Chief Complaint  Patient presents with   Altered Mental Status    HPI Carrie Chaney is a 78 y.o. female.     HPI Patient presents to the emergency room for evaluation of altered mental status, possible stroke.  According to the EMS report the patient was last seen normal at about noon at her nursing facility.  At that time they brought food in for and she was acting normally.  EMS was called today because they noticed that the patient was slurring her speech.  She was weak.  In the ED is difficult to get a clear history from the patient.  She clearly is having some difficulty with her speech moving her extremities to code stroke was activated.  Her on additional information was received from the nursing facility.  Patient has had a lot of diarrhea recently.  She has not been eating or drinking well for several weeks. Past Medical History:  Diagnosis Date   Acute kidney failure ([place])    Diabetes mellitus without complication ([place])    Generalized edema    Hyperlipemia    Hypertension    Secondary malignant neoplasm of unspecified site [facility])     Patient Active Problem List   Diagnosis Date Noted   Metastatic adenocarcinoma ([place]) 09/13/2018   Abnormal LFTs    Weakness    Anasarca 09/07/2018   Elevated liver enzymes 09/07/2018   Proximal limb muscle weakness 09/07/2018   Dehydration 08/15/2016   Acute renal failure (ARF) ([place]) 08/15/2016   Leukocytosis 08/15/2016   Hypertension 08/15/2016   Postural dizziness with presyncope 08/15/2016    Past Surgical History:  Procedure Laterality Date   ABDOMINAL HYSTERECTOMY     She is unsure if her ovaries were removed.    BIOPSY  09/16/2018   Procedure: BIOPSY;  Surgeon: Otis Brace, MD;  Location: [facility];  Service: Gastroenterology;;  EGD and Colon    COLONOSCOPY WITH PROPOFOL N/A 09/16/2018   Procedure: COLONOSCOPY WITH PROPOFOL;  Surgeon: Otis Brace, MD;  Location: [facility];  Service: Gastroenterology;  Laterality: N/A;   ESOPHAGOGASTRODUODENOSCOPY (EGD) WITH PROPOFOL N/A 09/16/2018   Procedure: ESOPHAGOGASTRODUODENOSCOPY (EGD) WITH PROPOFOL;  Surgeon: Otis Brace, MD;  Location: [facility];  Service: Gastroenterology;  Laterality: N/A;     OB History   No obstetric history on file.      Home Medications    Prior to Admission medications   Medication Sig Start Date End Date Taking? Authorizing Provider  amLODipine (NORVASC) 5 MG tablet Take 1 tablet (5 mg total) by mouth daily. 09/21/18   Shelly Coss, MD  furosemide (LASIX) 80 MG tablet Take 1 tablet (80 mg total) by mouth 2 (two) times daily. 09/20/18   Shelly Coss, MD  hydrALAZINE (APRESOLINE) 50 MG tablet Take 1 tablet (50 mg total) by mouth every 8 (eight) hours. 09/20/18   Shelly Coss, MD  Multiple Vitamin (MULTIVITAMIN WITH MINERALS) TABS tablet Take 1 tablet by mouth daily. 09/21/18   Shelly Coss, MD  potassium chloride SA (K-DUR,KLOR-CON) 20 MEQ tablet Take 2 tablets (40 mEq total) by mouth 2 (two) times daily. 09/20/18   Shelly Coss, MD  spironolactone (ALDACTONE) 25 MG tablet Take 1 tablet (25 mg total) by mouth daily. 09/21/18   Shelly Coss, MD    Family History Family History  Problem Relation Age of Onset   Hypertension  Mother    Diabetes Other    Cancer Neg Hx     Social History Social History   Tobacco Use   Smoking status: Former Smoker    Packs/day: 0.25    Last attempt to quit: 08/22/2018    Years since quitting: 0.1   Smokeless tobacco: Never Used   Tobacco comment: one a day  Substance Use Topics   Alcohol use: No   Drug use: No     Allergies   Tomato   Review of Systems Review of Systems  All other systems reviewed and are negative.    Physical Exam Updated Vital Signs BP (!) 105/93    Pulse  95    Temp (S) 98.9 F (37.2 C) (Rectal)    Resp (!) 22    SpO2 95%   Physical Exam Vitals signs and nursing note reviewed.  Constitutional:      General: She is not in acute distress.    Appearance: She is well-developed.  HENT:     Head: Normocephalic and atraumatic.     Right Ear: External ear normal.     Left Ear: External ear normal.     Mouth/Throat:     Mouth: Mucous membranes are dry.  Eyes:     General: No scleral icterus.       Right eye: No discharge.        Left eye: No discharge.     Conjunctiva/sclera: Conjunctivae normal.  Neck:     Musculoskeletal: Neck supple.     Trachea: No tracheal deviation.  Cardiovascular:     Rate and Rhythm: Normal rate and regular rhythm.  Pulmonary:     Effort: Pulmonary effort is normal. No respiratory distress.     Breath sounds: Normal breath sounds. No stridor. No wheezing or rales.  Abdominal:     General: Bowel sounds are normal. There is no distension.     Palpations: Abdomen is soft.     Tenderness: There is no abdominal tenderness. There is no guarding or rebound.  Musculoskeletal:        General: No tenderness.  Skin:    General: Skin is warm and dry.     Findings: Petechiae present.  Neurological:     Mental Status: She is lethargic.     Cranial Nerves: No cranial nerve deficit (no facial droop, extraocular movements intact, no slurred speech).     Sensory: No sensory deficit.     Motor: Weakness present. No abnormal muscle tone or seizure activity.     Coordination: Coordination abnormal.     Comments: Patient is able to answer questions, she is aware that she is in [place] but unable to tell me what type place this is, confused about date, diffusely weak      ED Treatments / Results  Labs (all labs ordered are listed, but only abnormal results are displayed) Labs Reviewed  PROTIME-INR - Abnormal; Notable for the following components:      Result Value   Prothrombin Time 16.9 (*)    INR 1.4 (*)     All other components within normal limits  CBC - Abnormal; Notable for the following components:   Hemoglobin 11.2 (*)    HCT 34.6 (*)    RDW 20.6 (*)    Platelets 9 (*)    nRBC 2.9 (*)    All other components within normal limits  DIFFERENTIAL - Abnormal; Notable for the following components:   Lymphs Abs 0.3 (*)  Abs Immature Granulocytes 0.08 (*)    All other components within normal limits  COMPREHENSIVE METABOLIC PANEL - Abnormal; Notable for the following components:   Potassium 6.6 (*)    Chloride 112 (*)    CO2 15 (*)    Glucose, Bld 144 (*)    BUN 112 (*)    Creatinine, Ser 3.11 (*)    Calcium 7.8 (*)    Total Protein 5.2 (*)    Albumin 1.2 (*)    AST 121 (*)    ALT 199 (*)    Alkaline Phosphatase 304 (*)    Total Bilirubin 2.8 (*)    GFR calc non Af Amer 14 (*)    GFR calc Af Amer 16 (*)    All other components within normal limits  URINALYSIS, ROUTINE W REFLEX MICROSCOPIC - Abnormal; Notable for the following components:   Color, Urine AMBER (*)    APPearance CLOUDY (*)    Hgb urine dipstick MODERATE (*)    Bilirubin Urine SMALL (*)    Protein, ur 30 (*)    Leukocytes,Ua MODERATE (*)    WBC, UA >50 (*)    Bacteria, UA MANY (*)    Non Squamous Epithelial 11-20 (*)    All other components within normal limits  I-STAT CREATININE, ED - Abnormal; Notable for the following components:   Creatinine, Ser 3.00 (*)    All other components within normal limits  URINE CULTURE  ETHANOL  APTT  RAPID URINE DRUG SCREEN, HOSP PERFORMED    EKG EKG Interpretation  Date/Time:  Monday October 03 2018 18:08:59 EDT Ventricular Rate:  87 PR Interval:    QRS Duration: 96 QT Interval:  341 QTC Calculation: 411 R Axis:   32 Text Interpretation:  Sinus rhythm Probable left atrial enlargement Borderline repolarization abnormality Borderline ST elevation, anterior leads Baseline wander in lead(s) V4 No significant change since last tracing Confirmed by Dorie Rank [phone]) on  09/20/2018 6:12:31 PM   Radiology Ct Angio Head W Or Wo Contrast  Result Date: 10/12/2018 CLINICAL DATA:  Code stroke. Altered mental status. EXAM: CT ANGIOGRAPHY HEAD AND NECK TECHNIQUE: Multidetector CT imaging of the head and neck was performed using the standard protocol during bolus administration of intravenous contrast. Multiplanar CT image reconstructions and MIPs were obtained to evaluate the vascular anatomy. Carotid stenosis measurements (when applicable) are obtained utilizing NASCET criteria, using the distal internal carotid diameter as the denominator. CONTRAST:  48mL OMNIPAQUE IOHEXOL 350 MG/ML SOLN COMPARISON:  Code stroke CT head earlier today. FINDINGS: CTA NECK FINDINGS Aortic arch: Standard branching. Imaged portion shows no evidence of aneurysm or dissection. 50-75% stenosis of the proximal LEFT subclavian, otherwise no significant stenosis of the major arch vessel origins. Right carotid system: No evidence of dissection, stenosis (50% or greater) or occlusion. Calcific atheromatous change at the bifurcation. Left carotid system: No evidence of dissection, stenosis (50% or greater) or occlusion. Calcific atheromatous change at the bifurcation. Vertebral arteries: Dominant RIGHT vertebral. 50-75% stenosis LEFT vertebral origin, with adjacent ostial calcification. Skeleton: Spondylosis. Other neck: Noncontributory. Upper chest: Superior segment RIGHT lower lobe atelectasis. subpleural blebs along the medial RIGHT upper lobe. Review of the MIP images confirms the above findings CTA HEAD FINDINGS Anterior circulation: No significant stenosis, proximal occlusion, aneurysm, or vascular malformation. Posterior circulation: No significant stenosis, proximal occlusion, aneurysm, or vascular malformation. 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 Venous sinuses: As permitted by contrast timing, patent. Anatomic variants:  BILATERAL fetal PCA. Delayed phase: Not performed. Review of the MIP images confirms the above findings IMPRESSION: 1. No intracranial or extracranial anterior circulation stenosis of significance. 2. 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 4. Subpleural blebs along the medial RIGHT upper lobe. Superior segment RIGHT lower lobe atelectasis. 5. Based on the decision of the ordering provider at the scanner, CT perfusion was canceled. These results were communicated to Dr. Leonel Ramsay at [facility] pmon 03/21/2020by text page via the [facility] messaging system. Electronically Signed   By: Staci Righter M.D.   On: 10/17/2018 18:26   Ct Angio Neck W Or Wo Contrast  Result Date: 10/01/2018 CLINICAL DATA:  Code stroke. Altered mental status. EXAM: CT ANGIOGRAPHY HEAD AND NECK TECHNIQUE: Multidetector CT imaging of the head and neck was performed using the standard protocol during bolus administration of intravenous contrast. Multiplanar CT image reconstructions and MIPs were obtained to evaluate the vascular anatomy. Carotid stenosis measurements (when applicable) are obtained utilizing NASCET criteria, using the distal internal carotid diameter as the denominator. CONTRAST:  62mL OMNIPAQUE IOHEXOL 350 MG/ML SOLN COMPARISON:  Code stroke CT head earlier today. FINDINGS: CTA NECK FINDINGS Aortic arch: Standard branching. Imaged portion shows no evidence of aneurysm or dissection. 50-75% stenosis of the proximal LEFT subclavian, otherwise no significant stenosis of the major arch vessel origins. Right carotid system: No evidence of dissection, stenosis (50% or greater) or occlusion. Calcific atheromatous change at the bifurcation. Left carotid system: No evidence of dissection, stenosis (50% or greater) or occlusion. Calcific atheromatous change at the bifurcation. Vertebral  arteries: Dominant RIGHT vertebral. 50-75% stenosis LEFT vertebral origin, with adjacent ostial calcification. Skeleton: Spondylosis. Other neck: Noncontributory. Upper chest: Superior segment RIGHT lower lobe atelectasis. subpleural blebs along the medial RIGHT upper lobe. Review of the MIP images confirms the above findings CTA HEAD FINDINGS Anterior circulation: No significant stenosis, proximal occlusion, aneurysm, or vascular malformation. Posterior circulation: No significant stenosis, proximal occlusion, aneurysm, or vascular malformation. 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 Venous sinuses: As permitted by contrast timing, patent. Anatomic variants: BILATERAL fetal PCA. Delayed phase: Not performed. Review of the MIP images confirms the above findings IMPRESSION: 1. No intracranial or extracranial anterior circulation stenosis of significance. 2. 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 4. Subpleural blebs along the medial RIGHT upper lobe. Superior segment RIGHT lower lobe atelectasis. 5. Based on the decision of the ordering provider at the scanner, CT perfusion was canceled. These results were communicated to Dr. Leonel Ramsay at [facility] pmon 03/01/2020by text page via the [facility] messaging system. Electronically Signed   By: Staci Righter M.D.   On: 10/03/2018 18:26   Ct Head Code Stroke Wo Contrast  Result Date: 10/03/2018 CLINICAL DATA:  Code stroke.  Altered mental status. EXAM: CT HEAD WITHOUT CONTRAST TECHNIQUE: Contiguous axial images were obtained from the base of the skull through the vertex without intravenous contrast. COMPARISON:  08/14/2016 FINDINGS: Brain: There is no evidence of acute infarct, intracranial hemorrhage, mass, midline shift, or extra-axial fluid  collection. A cavum septum pellucidum et vergae is incidentally noted. The ventricles and sulci are within normal limits for age. Vascular: Calcified atherosclerosis at the skull base. No hyperdense vessel. Skull: No fracture or focal osseous lesion. Sinuses/Orbits:  Visualized paranasal sinuses and mastoid air cells are clear. Were bits are unremarkable. Other: None. ASPECTS [facility] Stroke Program Early CT Score) Not scored with this history. IMPRESSION: No evidence of acute intracranial abnormality. These results were communicated to Dr. Leonel Ramsay at 5:40 pm on 09/30/2018 by text page via the [facility] messaging system. Electronically Signed   By: Logan Bores M.D.   On: 09/21/2018 17:41    Procedures .Critical Care Performed by: Dorie Rank, MD Authorized by: Dorie Rank, MD   Critical care provider statement:    Critical care time (minutes):  45   Critical care was time spent personally by me on the following activities:  Discussions with consultants, evaluation of patient's response to treatment, examination of patient, ordering and performing treatments and interventions, ordering and review of laboratory studies, ordering and review of radiographic studies, pulse oximetry, re-evaluation of patient's condition, obtaining history from patient or surrogate and review of old charts   (including critical care time)  Medications Ordered in ED Medications  sodium chloride 0.9 % bolus 500 mL (has no administration in time range)    Followed by  0.9 %  sodium chloride infusion (has no administration in time range)  insulin aspart (novoLOG) injection 5 Units (has no administration in time range)  sodium zirconium cyclosilicate (LOKELMA) packet 5 g (has no administration in time range)  sodium chloride 0.9 % bolus 1,000 mL (has no administration in time range)  cefTRIAXone (ROCEPHIN) 1 g in sodium chloride 0.9 % 100 mL IVPB (has no administration in time range)  iohexol (OMNIPAQUE) 350 MG/ML injection  60 mL (60 mLs Intravenous Contrast Given 10/17/2018 1749)     Initial Impression / Assessment and Plan / ED Course  I have reviewed the triage vital signs and the nursing notes.  Pertinent labs & imaging results that were available during my care of the patient were reviewed by me and considered in my medical decision making (see chart for details).  Clinical Course as of Oct 02 1920  Mon Oct 03, 2018  1843 LFTs show increasing values compared to last month.  Bilirubins have 2.8.  Patient also has evidence of acute kidney injury with hyperkalemia   [JK]  1846 Urinalysis suggest the possibility of urinary tract infection.  Patient also has profound thrombocytopenia.   [DP]  8242 Patient was seen by Dr. Leonel Ramsay, neurology.  He suspects metabolic issue and not an acute stroke.  Does recommend admission for further work-up   [JK]  1901 I discussed the findings with the patient and her family.  They indicate the patient has not started any chemotherapy treatments.  Patient was scheduled to have an appointment tomorrow.   [JK]  1920 Discussed with Dr Janann Colonel.  Will have CC service see the patient.   [JK]    Clinical Course User Index [JK] Dorie Rank, MD     Patient presents to the emergency room for evaluation of altered mental status and weakness.  Patient has multiple abnormalities on her laboratory tests.  She has worsening liver failure.  She also has acute kidney injury with multiple electrolyte abnormalities including hyperkalemia.  Patient also has thrombocytopenia.  IV fluids have been ordered.  AntiBiotics for urinary tract infection ordered.  Will consult with medical service for admission.  May benefit from palliative care consult.    Final Clinical Impressions(s) / ED Diagnoses   Final diagnoses:  Altered mental status, unspecified altered mental status type  Thrombocytopenia ([place])  AKI (acute kidney injury) ([place])  Hyperkalemia  Metastatic cancer [facility])         Dorie Rank, MD 09/27/2018 2000

## 2018-10-03 NOTE — Progress Notes (Signed)
eLink Physician-Brief Progress Note Patient Name: Lerae Langham DOB: 05/25/1941 MRN: [id number]   Date of Service  10/08/2018  HPI/Events of Note  Called by Hematology with recommendations for 1. FFP 2 units tonight and 2. Central venous access for plasmapheresis in AM. Both for TTP.  eICU Interventions  Will order: 1. FFP 2 units IV tonight. 2. Ground team notified of need for access for plasmapheresis.      Intervention Category Intermediate Interventions: Coagulopathy - evaluation and management  Sommer,Steven Eugene 09/25/2018, 9:24 PM

## 2018-10-03 NOTE — ED Triage Notes (Signed)
Pt arrives to ED from [place] with complaints of AMS with a LKN at 1300 per nursing home staff. When EMS arrived they did not have an accurate LKN. When pt arrived to ED pt had slurred speech, confusion and weakness in all four extremities. Pt was evaluated in ED and was called a Code Stroke by MD Tomi Bamberger.

## 2018-10-03 NOTE — ED Notes (Addendum)
ED TO INPATIENT HANDOFF REPORT  ED Nurse Name and Phone #: Otila Kluver RN @ [phone]  S Name/Age/Gender Carrie Chaney 78 y.o. female Room/Bed: 034C/034C  Code Status   Code Status: Full Code  Home/SNF/Other Skilled nursing facility Patient oriented to: self Is this baseline? Yes   Triage Complete: Triage complete  Chief Complaint AMS  Triage Note Pt arrives to ED from [facility] with complaints of AMS with a LKN at 1300 per nursing home staff. When EMS arrived they did not have an accurate LKN. When pt arrived to ED pt had slurred speech, confusion and weakness in all four extremities. Pt was evaluated in ED and was called a Code Stroke by MD Tomi Bamberger.    Allergies Allergies  Allergen Reactions  . Tomato Other (See Comments)    Breaks out gums    Level of Care/Admitting Diagnosis ED Disposition    ED Disposition Condition [facility] Area: [place] [[id number]]  Level of Care: ICU [6]  Diagnosis: TTP (thrombotic thrombocytopenic purpura) [facility]) [[id number]]  Admitting Physician: Renee Pain [[id number]]  Attending Physician: Renee Pain [[id number]]  Estimated length of stay: > 1 week  Certification:: I certify this patient will need inpatient services for at least 2 midnights  PT Class (Do Not Modify): Inpatient [101]  PT Acc Code (Do Not Modify): Private [1]       B Medical/Surgery History Past Medical History:  Diagnosis Date  . Acute kidney failure ([place])   . Diabetes mellitus without complication ([place])   . Generalized edema   . Hyperlipemia   . Hypertension   . Secondary malignant neoplasm of unspecified site [facility])    Past Surgical History:  Procedure Laterality Date  . ABDOMINAL HYSTERECTOMY     She is unsure if her ovaries were removed.   [place] Kitchen BIOPSY  09/16/2018   Procedure: BIOPSY;  Surgeon: Otis Brace, MD;  Location: [facility];  Service: Gastroenterology;;  EGD and Colon  . COLONOSCOPY WITH PROPOFOL N/A 09/16/2018    Procedure: COLONOSCOPY WITH PROPOFOL;  Surgeon: Otis Brace, MD;  Location: [facility];  Service: Gastroenterology;  Laterality: N/A;  . ESOPHAGOGASTRODUODENOSCOPY (EGD) WITH PROPOFOL N/A 09/16/2018   Procedure: ESOPHAGOGASTRODUODENOSCOPY (EGD) WITH PROPOFOL;  Surgeon: Otis Brace, MD;  Location: [facility];  Service: Gastroenterology;  Laterality: N/A;     A IV Location/Drains/Wounds Patient Lines/Drains/Airways Status   Active Line/Drains/Airways    Name:   Placement date:   Placement time:   Site:   Days:   Peripheral IV 09/07/18 Right Antecubital   09/07/18    2159    Antecubital   26          Intake/Output Last 24 hours  Intake/Output Summary (Last 24 hours) at 10/06/2018 2106 Last data filed at 09/26/2018 2048 Gross per 24 hour  Intake 1600 ml  Output -  Net 1600 ml    Labs/Imaging Results for orders placed or performed during the hospital encounter of 09/29/2018 (from the past 48 hour(s))  Urine rapid drug screen (hosp performed)not at [facility]     Status: None   Collection Time: 10/03/18  5:28 PM  Result Value Ref Range   Opiates NONE DETECTED NONE DETECTED   Cocaine NONE DETECTED NONE DETECTED   Benzodiazepines NONE DETECTED NONE DETECTED   Amphetamines NONE DETECTED NONE DETECTED   Tetrahydrocannabinol NONE DETECTED NONE DETECTED   Barbiturates NONE DETECTED NONE DETECTED    Comment: (NOTE) DRUG SCREEN FOR MEDICAL PURPOSES ONLY.  IF CONFIRMATION IS NEEDED  FOR ANY PURPOSE, NOTIFY LAB WITHIN 5 DAYS. LOWEST DETECTABLE LIMITS FOR URINE DRUG SCREEN Drug Class                     Cutoff (ng/mL) Amphetamine and metabolites    1000 Barbiturate and metabolites    200 Benzodiazepine                 644 Tricyclics and metabolites     300 Opiates and metabolites        300 Cocaine and metabolites        300 THC                            50 Performed at [facility], [place] [address], [place], [place] [address]   Ethanol     Status: None    Collection Time: 10/11/2018  5:48 PM  Result Value Ref Range   Alcohol, Ethyl (B) <10 <10 mg/dL    Comment: (NOTE) Lowest detectable limit for serum alcohol is 10 mg/dL. For medical purposes only. Performed at [facility], [place] [address] [place], [place] [address]   Protime-INR     Status: Abnormal   Collection Time: 10/06/2018  5:48 PM  Result Value Ref Range   Prothrombin Time 16.9 (H) 11.4 - 15.2 seconds   INR 1.4 (H) 0.8 - 1.2    Comment: (NOTE) INR goal varies based on device and disease states. Performed at [facility], [place] [address] [place], [place] [address]   APTT     Status: None   Collection Time: 09/21/2018  5:48 PM  Result Value Ref Range   aPTT 28 24 - 36 seconds    Comment: Performed at [place] [address] [place], [place] [address]  CBC     Status: Abnormal   Collection Time: 10/16/2018  5:48 PM  Result Value Ref Range   WBC 6.7 4.0 - 10.5 K/uL   RBC 4.00 3.87 - 5.11 MIL/uL   Hemoglobin 11.2 (L) 12.0 - 15.0 g/dL   HCT 34.6 (L) 36.0 - 46.0 %   MCV 86.5 80.0 - 100.0 fL   MCH 28.0 26.0 - 34.0 pg   MCHC 32.4 30.0 - 36.0 g/dL   RDW 20.6 (H) 11.5 - 15.5 %   Platelets 9 (LL) 150 - 400 K/uL    Comment: REPEATED TO VERIFY PLATELET COUNT CONFIRMED BY SMEAR SPECIMEN CHECKED FOR CLOTS Immature Platelet Fraction may be clinically indicated, consider ordering this additional test [id number]    nRBC 2.9 (H) 0.0 - 0.2 %    Comment: Performed at [place] [address], [place],  [address]  Differential     Status: Abnormal   Collection Time: 10/01/2018  5:48 PM  Result Value Ref Range   Neutrophils Relative % 88 %   Neutro Abs 5.9 1.7 - 7.7 K/uL   Lymphocytes Relative 4 %   Lymphs Abs 0.3 (L) 0.7 - 4.0 K/uL   Monocytes Relative 2 %   Monocytes Absolute 0.1 0.1 - 1.0 K/uL   Eosinophils Relative 4 %   Eosinophils Absolute 0.3 0.0 - 0.5 K/uL   Basophils Relative 1 %   Basophils Absolute 0.0 0.0 - 0.1 K/uL   Immature  Granulocytes 1 %   Abs Immature Granulocytes 0.08 (H) 0.00 - 0.07 K/uL    Comment: Performed at [facility]  [address], [place], [place] [address]  Comprehensive metabolic panel     Status: Abnormal   Collection Time: 10/01/2018  5:48 PM  Result Value Ref Range   Sodium 142 135 - 145 mmol/L   Potassium 6.6 (HH) 3.5 - 5.1 mmol/L    Comment: CRITICAL RESULT CALLED TO, READ BACK BY AND VERIFIED WITH: Veneda Melter 6433 10/18/2018 WBOND NO VISIBLE HEMOLYSIS    Chloride 112 (H) 98 - 111 mmol/L   CO2 15 (L) 22 - 32 mmol/L   Glucose, Bld 144 (H) 70 - 99 mg/dL   BUN 112 (H) 8 - 23 mg/dL   Creatinine, Ser 3.11 (H) 0.44 - 1.00 mg/dL   Calcium 7.8 (L) 8.9 - 10.3 mg/dL   Total Protein 5.2 (L) 6.5 - 8.1 g/dL   Albumin 1.2 (L) 3.5 - 5.0 g/dL   AST 121 (H) 15 - 41 U/L   ALT 199 (H) 0 - 44 U/L   Alkaline Phosphatase 304 (H) 38 - 126 U/L   Total Bilirubin 2.8 (H) 0.3 - 1.2 mg/dL   GFR calc non Af Amer 14 (L) >60 mL/min   GFR calc Af Amer 16 (L) >60 mL/min   Anion gap 15 5 - 15    Comment: Performed at [facility], [place] [address], [place], [place] [address]  I-stat Creatinine, ED     Status: Abnormal   Collection Time: 10/01/2018  5:53 PM  Result Value Ref Range   Creatinine, Ser 3.00 (H) 0.44 - 1.00 mg/dL  Urinalysis, Routine w reflex microscopic (not at [facility])     Status: Abnormal   Collection Time: 10/15/2018  6:24 PM  Result Value Ref Range   Color, Urine AMBER (A) YELLOW    Comment: BIOCHEMICALS MAY BE AFFECTED BY COLOR   APPearance CLOUDY (A) CLEAR   Specific Gravity, Urine 1.017 1.005 - 1.030   pH 6.0 5.0 - 8.0   Glucose, UA NEGATIVE NEGATIVE mg/dL   Hgb urine dipstick MODERATE (A) NEGATIVE   Bilirubin Urine SMALL (A) NEGATIVE   Ketones, ur NEGATIVE NEGATIVE mg/dL   Protein, ur 30 (A) NEGATIVE mg/dL   Nitrite NEGATIVE NEGATIVE   Leukocytes,Ua MODERATE (A) NEGATIVE   RBC / HPF 21-50 0 - 5 RBC/hpf   WBC, UA >50 (H) 0 - 5 WBC/hpf   Bacteria, UA MANY (A) NONE SEEN    Squamous Epithelial / LPF 0-5 0 - 5   WBC Clumps PRESENT    Mucus PRESENT    Non Squamous Epithelial 11-20 (A) NONE SEEN    Comment: Performed at [facility], [address], [place], [place] [address]  Save Smear     Status: None   Collection Time: 10/11/2018  7:30 PM  Result Value Ref Range   Smear Review SMEAR STAINED AND AVAILABLE FOR REVIEW     Comment: Performed at [facility] [address], [place], [place] [address]  DIC panel     Status: Abnormal (Preliminary result)   Collection Time: 10/02/2018  7:37 PM  Result Value Ref Range   Prothrombin Time 17.8 (H) 11.4 - 15.2 seconds    Comment: REPEATED TO VERIFY   INR 1.5 (H) 0.8 - 1.2    Comment: REPEATED TO VERIFY   aPTT 30 24 - 36 seconds   Fibrinogen 468 210 - 475 mg/dL   D-Dimer, Quant 12.86 (H) 0.00 - 0.50 ug/mL-FEU    Comment: REPEATED TO VERIFY (NOTE) At the manufacturer cut-off of 0.50 ug/mL FEU, this  assay has been documented to exclude PE with a sensitivity and negative predictive value of 97 to 99%.  At this time, this assay has not been approved by the FDA to exclude DVT/VTE. Results should be correlated with clinical presentation.    Platelets 12 (LL) 150 - 400 K/uL    Comment: REPEATED TO VERIFY Immature Platelet Fraction may be clinically indicated, consider ordering this additional test [id number] CRITICAL VALUE NOTED.  VALUE IS CONSISTENT WITH PREVIOUSLY REPORTED AND CALLED VALUE. Performed at [facility], [place] [address], [place], [place] [address]    Smear Review PENDING   Lactic acid, plasma     Status: Abnormal   Collection Time: 09/25/2018  7:37 PM  Result Value Ref Range   Lactic Acid, Venous 4.5 (HH) 0.5 - 1.9 mmol/L    Comment: CRITICAL RESULT CALLED TO, READ BACK BY AND VERIFIED WITH: Rochele Raring 2008 09/29/2018 D BRADLEY Performed at [facility], [place] [address] [place], [place] [address]    Ct Angio Head W Or Wo Contrast  Result Date: 09/21/2018 CLINICAL DATA:  Code  stroke. Altered mental status. EXAM: CT ANGIOGRAPHY HEAD AND NECK TECHNIQUE: Multidetector CT imaging of the head and neck was performed using the standard protocol during bolus administration of intravenous contrast. Multiplanar CT image reconstructions and MIPs were obtained to evaluate the vascular anatomy. Carotid stenosis measurements (when applicable) are obtained utilizing NASCET criteria, using the distal internal carotid diameter as the denominator. CONTRAST:  42mL OMNIPAQUE IOHEXOL 350 MG/ML SOLN COMPARISON:  Code stroke CT head earlier today. FINDINGS: CTA NECK FINDINGS Aortic arch: Standard branching. Imaged portion shows no evidence of aneurysm or dissection. 50-75% stenosis of the proximal LEFT subclavian, otherwise no significant stenosis of the major arch vessel origins. Right carotid system: No evidence of dissection, stenosis (50% or greater) or occlusion. Calcific atheromatous change at the bifurcation. Left carotid system: No evidence of dissection, stenosis (50% or greater) or occlusion. Calcific atheromatous change at the bifurcation. Vertebral arteries: Dominant RIGHT vertebral. 50-75% stenosis LEFT vertebral origin, with adjacent ostial calcification. Skeleton: Spondylosis. Other neck: Noncontributory. Upper chest: Superior segment RIGHT lower lobe atelectasis. subpleural blebs along the medial RIGHT upper lobe. Review of the MIP images confirms the above findings CTA HEAD FINDINGS Anterior circulation: No significant stenosis, proximal occlusion, aneurysm, or vascular malformation. Posterior circulation: No significant stenosis, proximal occlusion, aneurysm, or vascular malformation. 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 Venous sinuses: As permitted by contrast timing, patent. Anatomic variants: BILATERAL fetal PCA. Delayed phase: Not performed. Review of the MIP images confirms the above findings IMPRESSION: 1.  No intracranial or extracranial anterior circulation stenosis of significance. 2. 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 4. Subpleural blebs along the medial RIGHT upper lobe. Superior segment RIGHT lower lobe atelectasis. 5. Based on the decision of the ordering provider at the scanner, CT perfusion was canceled. These results were communicated to Dr. Leonel Ramsay at [facility] pmon 03/22/2020by text page via the [facility] messaging system. Electronically Signed   By: Staci Righter M.D.   On: 10/02/2018 18:26   Ct Angio Neck W Or Wo Contrast  Result Date: 10/03/2018 CLINICAL DATA:  Code stroke. Altered mental status. EXAM: CT ANGIOGRAPHY HEAD AND NECK TECHNIQUE: Multidetector CT imaging of the head and neck was performed using the standard protocol during bolus administration of intravenous  contrast. Multiplanar CT image reconstructions and MIPs were obtained to evaluate the vascular anatomy. Carotid stenosis measurements (when applicable) are obtained utilizing NASCET criteria, using the distal internal carotid diameter as the denominator. CONTRAST:  41mL OMNIPAQUE IOHEXOL 350 MG/ML SOLN COMPARISON:  Code stroke CT head earlier today. FINDINGS: CTA NECK FINDINGS Aortic arch: Standard branching. Imaged portion shows no evidence of aneurysm or dissection. 50-75% stenosis of the proximal LEFT subclavian, otherwise no significant stenosis of the major arch vessel origins. Right carotid system: No evidence of dissection, stenosis (50% or greater) or occlusion. Calcific atheromatous change at the bifurcation. Left carotid system: No evidence of dissection, stenosis (50% or greater) or occlusion. Calcific atheromatous change at the bifurcation. Vertebral arteries: Dominant RIGHT vertebral. 50-75% stenosis LEFT vertebral origin, with adjacent ostial calcification. Skeleton:  Spondylosis. Other neck: Noncontributory. Upper chest: Superior segment RIGHT lower lobe atelectasis. subpleural blebs along the medial RIGHT upper lobe. Review of the MIP images confirms the above findings CTA HEAD FINDINGS Anterior circulation: No significant stenosis, proximal occlusion, aneurysm, or vascular malformation. Posterior circulation: No significant stenosis, proximal occlusion, aneurysm, or vascular malformation. 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 Venous sinuses: As permitted by contrast timing, patent. Anatomic variants: BILATERAL fetal PCA. Delayed phase: Not performed. Review of the MIP images confirms the above findings IMPRESSION: 1. No intracranial or extracranial anterior circulation stenosis of significance. 2. 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 4. Subpleural blebs along the medial RIGHT upper lobe. Superior segment RIGHT lower lobe atelectasis. 5. Based on the decision of the ordering provider at the scanner, CT perfusion was canceled. These results were communicated to Dr. Leonel Ramsay at [facility] pmon 03/05/2020by text page via the [facility] messaging system. Electronically Signed   By: Staci Righter M.D.   On: 09/25/2018 18:26   Ct Head Code Stroke Wo Contrast  Result Date: 10/11/2018 CLINICAL DATA:  Code stroke.  Altered mental status. EXAM: CT HEAD WITHOUT CONTRAST TECHNIQUE: Contiguous axial images were obtained from the base of the skull through the vertex without intravenous contrast. COMPARISON:  08/14/2016 FINDINGS: Brain: There is no evidence of acute infarct, intracranial hemorrhage, mass, midline shift, or extra-axial fluid collection. A cavum septum pellucidum et vergae is incidentally noted. The ventricles and sulci are within normal limits for  age. Vascular: Calcified atherosclerosis at the skull base. No hyperdense vessel. Skull: No fracture or focal osseous lesion. Sinuses/Orbits: Visualized paranasal sinuses and mastoid air cells are clear. Were bits are unremarkable. Other: None. ASPECTS [facility] Stroke Program Early CT Score) Not scored with this history. IMPRESSION: No evidence of acute intracranial abnormality. These results were communicated to Dr. Leonel Ramsay at 5:40 pm on 10/18/2018 by text page via the [facility] messaging system. Electronically Signed   By: Logan Bores M.D.   On: 10/10/2018 17:41    Pending Labs Unresulted Labs (From admission, onward)    Start     Ordered   11-01-2018 0500  CBC  Tomorrow morning,   R     09/23/2018 2054   2018-11-01 9390  Basic metabolic panel  Tomorrow morning,   R     10/05/2018 2054   Nov 01, 2018 0500  Magnesium  Tomorrow morning,   R     10/03/18 2054   11/01/2018 0500  Phosphorus  Tomorrow morning,   R     10/03/18 2054   2018-11-01  0500  APTT  Tomorrow morning,   R     10/11/2018 2054   02-Nov-2018 0500  Protime-INR  Tomorrow morning,   R     09/30/2018 2054   Nov 02, 2018 0500  Fibrinogen  Tomorrow morning,   R     10/08/2018 2054   11/02/2018 0500  D-dimer, quantitative (not at [facility])  Tomorrow morning,   R     09/27/2018 2054   10/10/2018 2101  Hemoglobin A1c  Once,   R    Comments:  To assess prior glycemic control    10/01/2018 2101   10/11/2018 2045  Legionella Pneumophila Serogp 1 Ur Ag  Once,   R     10/02/2018 2054   10/01/2018 2045  Blood gas, arterial  Once,   R     09/22/2018 2054   10/13/2018 2044  Culture, blood (routine x 2)  BLOOD CULTURE X 2,   R     09/22/2018 2054   10/14/2018 2019  Vitamin B1  Once,   R     09/22/2018 2018   09/23/2018 1953  ADAMTS13 Activity  ONCE - STAT,   R     10/12/2018 1953   09/19/2018 1936  Ammonia  ONCE - STAT,   R     10/12/2018 1935   10/05/2018 1823  Urine culture  ONCE - STAT,   STAT     10/02/2018 1822   Signed and Held  Basic metabolic panel  Once,   R    Comments:  Prior to TPE     Signed and Held   Signed and Held  Therapeutic plasma exchange (blood bank)  Once,   R     Signed and Held          Vitals/Pain Today's Vitals   09/25/2018 1930 09/20/2018 2000 10/12/2018 2030 09/27/2018 2100  BP: (!) 117/41 (!) 106/45 (!) 108/44 (!) 116/42  Pulse: 97 94 96 100  Resp: (!) 22 (!) 24 (!) 23 (!) 31  Temp:      TempSrc:      SpO2: 94% 94% 95% 95%  PainSc:        Isolation Precautions No active isolations  Medications Medications  sodium zirconium cyclosilicate (LOKELMA) packet 5 g (5 g Oral Not Given 09/29/2018 1938)  thiamine (B-1) injection 100 mg (has no administration in time range)  sodium chloride 0.9 % bolus 1,000 mL ( Intravenous Rate/Dose Change 09/28/2018 2106)  dextrose 5 % 1,000 mL with sodium bicarbonate 150 mEq infusion (has no administration in time range)  pantoprazole (PROTONIX) injection 40 mg (has no administration in time range)  insulin aspart (novoLOG) injection 0-9 Units (has no administration in time range)  insulin aspart (novoLOG) injection 0-9 Units (has no administration in time range)  cefTRIAXone (ROCEPHIN) 1 g in sodium chloride 0.9 % 100 mL IVPB (has no administration in time range)  sodium chloride 0.9 % bolus 500 mL (0 mLs Intravenous Stopped 09/18/2018 2048)  iohexol (OMNIPAQUE) 350 MG/ML injection 60 mL (60 mLs Intravenous Contrast Given 09/22/2018 1749)  insulin aspart (novoLOG) injection 5 Units (5 Units Intravenous Given 10/15/2018 1904)  sodium chloride 0.9 % bolus 1,000 mL (0 mLs Intravenous Stopped 09/20/2018 2048)  cefTRIAXone (ROCEPHIN) 1 g in sodium chloride 0.9 % 100 mL IVPB (0 g Intravenous Stopped 10/03/18 2013)    Mobility non-ambulatory High fall risk   Focused Assessments Cardiac Assessment Handoff:    Lab Results  Component Value Date   CKTOTAL 86 09/07/2018   Lab  Results  Component Value Date   DDIMER 12.86 (H) 10/12/2018   Does the Patient currently have chest pain? No  , Neuro Assessment Handoff:  Swallow screen  pass? No    NIH Stroke Scale ( + Modified Stroke Scale Criteria)  Interval: Initial Level of Consciousness (1a.)   : Alert, keenly responsive LOC Questions (1b. )   +: Answers neither question correctly LOC Commands (1c. )   + : Performs one task correctly Best Gaze (2. )  +: Normal Visual (3. )  +: Partial hemianopia Facial Palsy (4. )    : Normal symmetrical movements Motor Arm, Left (5a. )   +: No effort against gravity Motor Arm, Right (5b. )   +: No effort against gravity Motor Leg, Left (6a. )   +: No effort against gravity Motor Leg, Right (6b. )   +: No effort against gravity Limb Ataxia (7. ): Absent Sensory (8. )   +: Normal, no sensory loss Best Language (9. )   +: No aphasia Dysarthria (10. ): Severe dysarthria, patient's speech is so slurred as to be unintelligible in the absence of or out of proportion to any dysphasia, or is mute/anarthric Extinction/Inattention (11.)   +: No Abnormality Modified SS Total  +: 16 Complete NIHSS TOTAL: 18 Last date known well: 10/12/2018 Last time known well: 1300 Neuro Assessment: Exceptions to WDL Neuro Checks:   Initial (10/06/2018 1800)  Last Documented NIHSS Modified Score: 16 (09/19/2018 1800) Has TPA been given? No If patient is a Neuro Trauma and patient is going to OR before floor call report to [place] nurse: [phone] or [phone]  , Renal Assessment Handoff:  Hemodialysis Schedule: N/A Last Hemodialysis date and time: Not currently on dialysis   Restricted appendage: None  , Pulmonary Assessment Handoff:  Lung sounds: L Breath Sounds: Clear R Breath Sounds: Clear O2 Device: Nasal Cannula O2 Flow Rate (L/min): 2 L/min      R Recommendations: See Admitting Provider Note  Report given to: Roselyn Reef, RN  Additional Notes:  Family (sister) at bedside; wounds present on under left breast, back and vagina

## 2018-10-04 ENCOUNTER — Inpatient Hospital Stay (HOSPITAL_COMMUNITY): Payer: Medicare Other

## 2018-10-04 ENCOUNTER — Inpatient Hospital Stay: Payer: Medicare Other | Admitting: Hematology

## 2018-10-04 DIAGNOSIS — L8990 Pressure ulcer of unspecified site, unspecified stage: Secondary | ICD-10-CM

## 2018-10-04 LAB — CBC
HEMATOCRIT: 34.6 % — AB (ref 36.0–46.0)
HEMOGLOBIN: 11.2 g/dL — AB (ref 12.0–15.0)
MCH: 28 pg (ref 26.0–34.0)
MCHC: 32.4 g/dL (ref 30.0–36.0)
MCV: 86.5 fL (ref 80.0–100.0)
Platelets: 9 10*3/uL — CL (ref 150–400)
RBC: 4 MIL/uL (ref 3.87–5.11)
RDW: 20.6 % — ABNORMAL HIGH (ref 11.5–15.5)
WBC: 6.7 10*3/uL (ref 4.0–10.5)
nRBC: 2.9 % — ABNORMAL HIGH (ref 0.0–0.2)

## 2018-10-04 LAB — BASIC METABOLIC PANEL
Anion gap: 15 (ref 5–15)
Anion gap: 18 — ABNORMAL HIGH (ref 5–15)
BUN: 111 mg/dL — ABNORMAL HIGH (ref 8–23)
BUN: 106 mg/dL — ABNORMAL HIGH (ref 8–23)
CHLORIDE: 113 mmol/L — AB (ref 98–111)
CO2: 11 mmol/L — AB (ref 22–32)
CO2: 14 mmol/L — AB (ref 22–32)
Calcium: 7.1 mg/dL — ABNORMAL LOW (ref 8.9–10.3)
Calcium: 6.9 mg/dL — ABNORMAL LOW (ref 8.9–10.3)
Chloride: 116 mmol/L — ABNORMAL HIGH (ref 98–111)
Creatinine, Ser: 2.96 mg/dL — ABNORMAL HIGH (ref 0.44–1.00)
Creatinine, Ser: 3.03 mg/dL — ABNORMAL HIGH (ref 0.44–1.00)
GFR calc Af Amer: 17 mL/min — ABNORMAL LOW (ref >60)
GFR calc Af Amer: 16 mL/min — ABNORMAL LOW (ref >60)
GFR calc non Af Amer: 15 mL/min — ABNORMAL LOW (ref >60)
GFR calc non Af Amer: 14 mL/min — ABNORMAL LOW (ref >60)
Glucose, Bld: 124 mg/dL — ABNORMAL HIGH (ref 70–99)
Glucose, Bld: 140 mg/dL — ABNORMAL HIGH (ref 70–99)
Potassium: 5.8 mmol/L — ABNORMAL HIGH (ref 3.5–5.1)
Potassium: 5.6 mmol/L — ABNORMAL HIGH (ref 3.5–5.1)
Sodium: 142 mmol/L (ref 135–145)
Sodium: 145 mmol/L (ref 135–145)

## 2018-10-04 LAB — POCT I-STAT 7, (LYTES, BLD GAS, ICA,H+H)
Acid-base deficit: 9 mmol/L — ABNORMAL HIGH (ref 0.0–2.0)
Acid-base deficit: 18 mmol/L — ABNORMAL HIGH (ref 0.0–2.0)
Acid-base deficit: 14 mmol/L — ABNORMAL HIGH (ref 0.0–2.0)
BICARBONATE: 10.1 mmol/L — AB (ref 20.0–28.0)
Bicarbonate: 13.7 mmol/L — ABNORMAL LOW (ref 20.0–28.0)
Bicarbonate: 12.7 mmol/L — ABNORMAL LOW (ref 20.0–28.0)
Calcium, Ion: 0.99 mmol/L — ABNORMAL LOW (ref 1.15–1.40)
Calcium, Ion: 1 mmol/L — ABNORMAL LOW (ref 1.15–1.40)
Calcium, Ion: 0.96 mmol/L — ABNORMAL LOW (ref 1.15–1.40)
HCT: 35 % — ABNORMAL LOW (ref 36.0–46.0)
HCT: 21 % — ABNORMAL LOW (ref 36.0–46.0)
HEMATOCRIT: 23 % — AB (ref 36.0–46.0)
Hemoglobin: 11.9 g/dL — ABNORMAL LOW (ref 12.0–15.0)
Hemoglobin: 7.8 g/dL — ABNORMAL LOW (ref 12.0–15.0)
Hemoglobin: 7.1 g/dL — ABNORMAL LOW (ref 12.0–15.0)
O2 Saturation: 91 %
O2 Saturation: 99 %
O2 Saturation: 96 %
Patient temperature: 98
Patient temperature: 97.4
Patient temperature: 97.8
Potassium: 5.1 mmol/L (ref 3.5–5.1)
Potassium: 5.9 mmol/L — ABNORMAL HIGH (ref 3.5–5.1)
Potassium: 5.7 mmol/L — ABNORMAL HIGH (ref 3.5–5.1)
Sodium: 145 mmol/L (ref 135–145)
Sodium: 143 mmol/L (ref 135–145)
Sodium: 145 mmol/L (ref 135–145)
TCO2: 14 mmol/L — ABNORMAL LOW (ref 22–32)
TCO2: 11 mmol/L — ABNORMAL LOW (ref 22–32)
TCO2: 14 mmol/L — ABNORMAL LOW (ref 22–32)
pCO2 arterial: 20.9 mmHg — ABNORMAL LOW (ref 32.0–48.0)
pCO2 arterial: 31.1 mmHg — ABNORMAL LOW (ref 32.0–48.0)
pCO2 arterial: 29.7 mmHg — ABNORMAL LOW (ref 32.0–48.0)
pH, Arterial: 7.422 (ref 7.350–7.450)
pH, Arterial: 7.117 — CL (ref 7.350–7.450)
pH, Arterial: 7.238 — ABNORMAL LOW (ref 7.350–7.450)
pO2, Arterial: 56 mmHg — ABNORMAL LOW (ref 83.0–108.0)
pO2, Arterial: 170 mmHg — ABNORMAL HIGH (ref 83.0–108.0)
pO2, Arterial: 91 mmHg (ref 83.0–108.0)

## 2018-10-04 LAB — CBC WITH DIFFERENTIAL/PLATELET
Band Neutrophils: 0 %
Basophils Absolute: 0.1 10*3/uL (ref 0.0–0.1)
Basophils Relative: 1 %
Blasts: 0 %
Eosinophils Absolute: 0.4 10*3/uL (ref 0.0–0.5)
Eosinophils Relative: 5 %
HCT: 24.9 % — ABNORMAL LOW (ref 36.0–46.0)
Hemoglobin: 8 g/dL — ABNORMAL LOW (ref 12.0–15.0)
Lymphocytes Relative: 4 %
Lymphs Abs: 0.3 10*3/uL — ABNORMAL LOW (ref 0.7–4.0)
MCH: 27.5 pg (ref 26.0–34.0)
MCHC: 32.1 g/dL (ref 30.0–36.0)
MCV: 85.6 fL (ref 80.0–100.0)
Metamyelocytes Relative: 0 %
Monocytes Absolute: 0.1 10*3/uL (ref 0.1–1.0)
Monocytes Relative: 1 %
Myelocytes: 0 %
NEUTROS ABS: 7.6 10*3/uL (ref 1.7–7.7)
NEUTROS PCT: 89 %
NRBC: 0 /100{WBCs}
Other: 0 %
Platelets: <5 10*3/uL — CL (ref 150–400)
Promyelocytes Relative: 0 %
RBC: 2.91 MIL/uL — ABNORMAL LOW (ref 3.87–5.11)
RDW: 20.4 % — ABNORMAL HIGH (ref 11.5–15.5)
WBC Morphology: INCREASED
WBC: 8.5 10*3/uL (ref 4.0–10.5)
nRBC: 2.2 % — ABNORMAL HIGH (ref 0.0–0.2)

## 2018-10-04 LAB — MRSA PCR SCREENING: MRSA by PCR: NEGATIVE

## 2018-10-04 LAB — PROTIME-INR
INR: 1.5 — ABNORMAL HIGH (ref 0.8–1.2)
Prothrombin Time: 17.6 seconds — ABNORMAL HIGH (ref 11.4–15.2)

## 2018-10-04 LAB — APTT: aPTT: 29 seconds (ref 24–36)

## 2018-10-04 LAB — PREPARE RBC (CROSSMATCH)

## 2018-10-04 LAB — HEMOGLOBIN A1C
Hgb A1c MFr Bld: 6.6 % — ABNORMAL HIGH (ref 4.8–5.6)
Mean Plasma Glucose: 142.72 mg/dL

## 2018-10-04 LAB — GLUCOSE, CAPILLARY
Glucose-Capillary: 136 mg/dL — ABNORMAL HIGH (ref 70–99)
Glucose-Capillary: 164 mg/dL — ABNORMAL HIGH (ref 70–99)

## 2018-10-04 LAB — OCCULT BLOOD X 1 CARD TO LAB, STOOL: Fecal Occult Bld: POSITIVE — AB

## 2018-10-04 LAB — D-DIMER, QUANTITATIVE (NOT AT ARMC): D DIMER QUANT: 13.03 ug{FEU}/mL — AB (ref 0.00–0.50)

## 2018-10-04 LAB — LACTIC ACID, PLASMA: Lactic Acid, Venous: 5.2 mmol/L (ref 0.5–1.9)

## 2018-10-04 LAB — TROPONIN I
Troponin I: 0.1 ng/mL (ref <0.03)
Troponin I: 0.13 ng/mL (ref <0.03)

## 2018-10-04 LAB — AMMONIA
Ammonia: 66 umol/L — ABNORMAL HIGH (ref 9–35)
Ammonia: 157 umol/L — ABNORMAL HIGH (ref 9–35)

## 2018-10-04 LAB — PHOSPHORUS: Phosphorus: 4.2 mg/dL (ref 2.5–4.6)

## 2018-10-04 LAB — MAGNESIUM
Magnesium: 2.3 mg/dL (ref 1.7–2.4)
Magnesium: 2.3 mg/dL (ref 1.7–2.4)

## 2018-10-04 LAB — ABO/RH: ABO/RH(D): AB POS

## 2018-10-04 LAB — FIBRINOGEN: Fibrinogen: 499 mg/dL — ABNORMAL HIGH (ref 210–475)

## 2018-10-04 LAB — PROCALCITONIN: Procalcitonin: 26.89 ng/mL

## 2018-10-04 LAB — BRAIN NATRIURETIC PEPTIDE: B Natriuretic Peptide: 278 pg/mL — ABNORMAL HIGH (ref 0.0–100.0)

## 2018-10-04 LAB — CORTISOL: CORTISOL PLASMA: 95.7 ug/dL

## 2018-10-04 LAB — PREALBUMIN: Prealbumin: 5.7 mg/dL — ABNORMAL LOW (ref 18–38)

## 2018-10-04 LAB — LACTATE DEHYDROGENASE: LDH: 1092 U/L — ABNORMAL HIGH (ref 98–192)

## 2018-10-04 MED ORDER — ACETAMINOPHEN 325 MG PO TABS: 650.0000 mg | ORAL_TABLET | ORAL | Status: DC | PRN

## 2018-10-04 MED ORDER — NOREPINEPHRINE 4 MG/250ML-% IV SOLN
0.0000 ug/min | INTRAVENOUS | Status: DC
  Administered 2018-10-04 (×3): 40 ug/min via INTRAVENOUS
  Filled 2018-10-04 (×2): qty 250

## 2018-10-04 MED ORDER — GLYCOPYRROLATE 0.2 MG/ML IJ SOLN: 0.2000 mg | INTRAMUSCULAR | Status: DC | PRN

## 2018-10-04 MED ORDER — ROCURONIUM BROMIDE 50 MG/5ML IV SOLN
100.0000 mg | Freq: Once | INTRAVENOUS | Status: AC
  Administered 2018-10-04: 100 mg via INTRAVENOUS

## 2018-10-04 MED ORDER — MIDAZOLAM HCL 2 MG/2ML IJ SOLN: 1.0000 mg | INTRAMUSCULAR | Status: DC | PRN

## 2018-10-04 MED ORDER — FENTANYL BOLUS VIA INFUSION
25.0000 ug | INTRAVENOUS | Status: DC | PRN
  Filled 2018-10-04: qty 25

## 2018-10-04 MED ORDER — ACETAMINOPHEN 325 MG PO TABS: 650.0000 mg | ORAL_TABLET | Freq: Four times a day (QID) | ORAL | Status: DC | PRN

## 2018-10-04 MED ORDER — MIDAZOLAM HCL 2 MG/2ML IJ SOLN
INTRAMUSCULAR | Status: AC
  Administered 2018-10-04: 2 mg via INTRAVENOUS
  Filled 2018-10-04: qty 2

## 2018-10-04 MED ORDER — SODIUM CHLORIDE 0.9% FLUSH: 10.0000 mL | Freq: Two times a day (BID) | INTRAVENOUS | Status: DC

## 2018-10-04 MED ORDER — SODIUM CHLORIDE 0.9% IV SOLUTION: Freq: Once | INTRAVENOUS | Status: AC

## 2018-10-04 MED ORDER — GLYCOPYRROLATE 1 MG PO TABS
1.0000 mg | ORAL_TABLET | ORAL | Status: DC | PRN
  Filled 2018-10-04: qty 1

## 2018-10-04 MED ORDER — SODIUM BICARBONATE 8.4 % IV SOLN
100.0000 meq | Freq: Once | INTRAVENOUS | Status: AC
  Administered 2018-10-04: 100 meq via INTRAVENOUS

## 2018-10-04 MED ORDER — ACD FORMULA A 0.73-2.45-2.2 GM/100ML VI SOLN: 500.0000 mL | Status: DC

## 2018-10-04 MED ORDER — NOREPINEPHRINE 4 MG/250ML-% IV SOLN
INTRAVENOUS | Status: AC
  Administered 2018-10-04: 4 ug/min via INTRAVENOUS
  Filled 2018-10-04: qty 250

## 2018-10-04 MED ORDER — MORPHINE SULFATE (PF) 2 MG/ML IV SOLN: 2.0000 mg | INTRAVENOUS | Status: DC | PRN

## 2018-10-04 MED ORDER — HEPARIN SODIUM (PORCINE) 1000 UNIT/ML IJ SOLN
3000.0000 [IU] | INTRAMUSCULAR | Status: DC | PRN
  Filled 2018-10-04: qty 3

## 2018-10-04 MED ORDER — MIDAZOLAM HCL 2 MG/2ML IJ SOLN
2.0000 mg | Freq: Once | INTRAMUSCULAR | Status: AC
  Administered 2018-10-04: 2 mg via INTRAVENOUS

## 2018-10-04 MED ORDER — SODIUM CHLORIDE 0.9% FLUSH: 10.0000 mL | INTRAVENOUS | Status: DC | PRN

## 2018-10-04 MED ORDER — CHLORHEXIDINE GLUCONATE CLOTH 2 % EX PADS: 6.0000 | MEDICATED_PAD | Freq: Every day | CUTANEOUS | Status: DC

## 2018-10-04 MED ORDER — FUROSEMIDE 10 MG/ML IJ SOLN
80.0000 mg | Freq: Once | INTRAMUSCULAR | Status: AC
  Administered 2018-10-04: 80 mg via INTRAVENOUS
  Filled 2018-10-04: qty 8

## 2018-10-04 MED ORDER — BISACODYL 10 MG RE SUPP: 10.0000 mg | Freq: Every day | RECTAL | Status: DC | PRN

## 2018-10-04 MED ORDER — ANTICOAGULANT SODIUM CITRATE 4% (200MG/5ML) IV SOLN: 5.0000 mL | Freq: Once | Status: DC

## 2018-10-04 MED ORDER — ETOMIDATE 2 MG/ML IV SOLN
20.0000 mg | Freq: Once | INTRAVENOUS | Status: AC
  Administered 2018-10-04: 20 mg via INTRAVENOUS

## 2018-10-04 MED ORDER — DOCUSATE SODIUM 50 MG/5ML PO LIQD: 100.0000 mg | Freq: Two times a day (BID) | ORAL | Status: DC | PRN

## 2018-10-04 MED ORDER — SODIUM BICARBONATE 8.4 % IV SOLN: 50.0000 meq | Freq: Once | INTRAVENOUS | Status: DC

## 2018-10-04 MED ORDER — SODIUM CHLORIDE 0.9 % IV SOLN
3.0000 g | Freq: Two times a day (BID) | INTRAVENOUS | Status: DC
  Filled 2018-10-04: qty 3

## 2018-10-04 MED ORDER — IPRATROPIUM-ALBUTEROL 0.5-2.5 (3) MG/3ML IN SOLN: 3.0000 mL | RESPIRATORY_TRACT | Status: DC | PRN

## 2018-10-04 MED ORDER — DIPHENHYDRAMINE HCL 25 MG PO CAPS: 25.0000 mg | ORAL_CAPSULE | Freq: Four times a day (QID) | ORAL | Status: DC | PRN

## 2018-10-04 MED ORDER — NOREPINEPHRINE 4 MG/250ML-% IV SOLN
2.0000 ug/min | INTRAVENOUS | Status: DC
  Administered 2018-10-04: 4 ug/min via INTRAVENOUS
  Filled 2018-10-04: qty 250

## 2018-10-04 MED ORDER — FENTANYL CITRATE (PF) 100 MCG/2ML IJ SOLN: 50.0000 ug | Freq: Once | INTRAMUSCULAR | Status: DC

## 2018-10-04 MED ORDER — CALCIUM CARBONATE ANTACID 500 MG PO CHEW: 2.0000 | CHEWABLE_TABLET | ORAL | Status: DC

## 2018-10-04 MED ORDER — HEPARIN SODIUM (PORCINE) 1000 UNIT/ML IJ SOLN: 3000.0000 [IU] | Freq: Once | INTRAMUSCULAR | Status: DC

## 2018-10-04 MED ORDER — FENTANYL CITRATE (PF) 100 MCG/2ML IJ SOLN
INTRAMUSCULAR | Status: AC
  Filled 2018-10-04: qty 2

## 2018-10-04 MED ORDER — ACETAMINOPHEN 650 MG RE SUPP: 650.0000 mg | Freq: Four times a day (QID) | RECTAL | Status: DC | PRN

## 2018-10-04 MED ORDER — SODIUM CHLORIDE 0.9 % IV SOLN
250.0000 mL | INTRAVENOUS | Status: DC
  Administered 2018-10-04: 250 mL via INTRAVENOUS

## 2018-10-04 MED ORDER — SODIUM CHLORIDE 0.9 % IV SOLN: 2.0000 g | Freq: Once | INTRAVENOUS | Status: DC

## 2018-10-04 MED ORDER — FENTANYL 2500MCG IN NS 250ML (10MCG/ML) PREMIX INFUSION: 25.0000 ug/h | INTRAVENOUS | Status: DC

## 2018-10-04 MED ORDER — ORAL CARE MOUTH RINSE: 15.0000 mL | OROMUCOSAL | Status: DC

## 2018-10-04 MED ORDER — CHLORHEXIDINE GLUCONATE 0.12% ORAL RINSE (MEDLINE KIT)
15.0000 mL | Freq: Two times a day (BID) | OROMUCOSAL | Status: DC
  Administered 2018-10-04: 15 mL via OROMUCOSAL

## 2018-10-04 MED ORDER — PIPERACILLIN-TAZOBACTAM 3.375 G IVPB: 3.3750 g | Freq: Three times a day (TID) | INTRAVENOUS | Status: DC

## 2018-10-04 MED ORDER — DIPHENHYDRAMINE HCL 50 MG/ML IJ SOLN: 25.0000 mg | INTRAMUSCULAR | Status: DC | PRN

## 2018-10-04 MED ORDER — VASOPRESSIN 20 UNIT/ML IV SOLN
0.0400 [IU]/min | INTRAVENOUS | Status: DC
  Administered 2018-10-04: 0.04 [IU]/min via INTRAVENOUS
  Filled 2018-10-04: qty 2

## 2018-10-04 MED ORDER — SODIUM BICARBONATE 8.4 % IV SOLN
INTRAVENOUS | Status: AC
  Filled 2018-10-04: qty 100

## 2018-10-04 MED ORDER — POLYVINYL ALCOHOL 1.4 % OP SOLN
1.0000 [drp] | Freq: Four times a day (QID) | OPHTHALMIC | Status: DC | PRN
  Filled 2018-10-04: qty 15

## 2018-10-05 LAB — VITAMIN B1: VITAMIN B1 (THIAMINE): 92.3 nmol/L (ref 66.5–200.0)

## 2018-10-05 LAB — PREPARE FRESH FROZEN PLASMA: Unit division: 0

## 2018-10-05 LAB — BPAM FFP
Blood Product Expiration Date: 202003192359
Blood Product Expiration Date: 202003222359
ISSUE DATE / TIME: 202003170113
ISSUE DATE / TIME: 202003170211
Unit Type and Rh: 8400
Unit Type and Rh: 8400

## 2018-10-05 LAB — URINE CULTURE: Culture: >=100000 — AB

## 2018-10-05 LAB — PREPARE PLATELET PHERESIS
Unit division: 0
Unit division: 0
Unit division: 0
Unit division: 0
Unit division: 0

## 2018-10-05 LAB — BPAM PLATELET PHERESIS
Blood Product Expiration Date: 202003182359
Blood Product Expiration Date: 202003182359
Blood Product Expiration Date: 202003192359
Blood Product Expiration Date: 202003192359
Blood Product Expiration Date: 202003192359
ISSUE DATE / TIME: 202003170223
ISSUE DATE / TIME: 202003170211
ISSUE DATE / TIME: 202003170514
ISSUE DATE / TIME: 202003170659
UNIT TYPE AND RH: 5100
Unit Type and Rh: 5100
Unit Type and Rh: 6200
Unit Type and Rh: 6200
Unit Type and Rh: 7300

## 2018-10-05 LAB — HAPTOGLOBIN: Haptoglobin: 313 mg/dL (ref 42–346)

## 2018-10-05 LAB — PATHOLOGIST SMEAR REVIEW

## 2018-10-06 ENCOUNTER — Encounter (HOSPITAL_COMMUNITY): Payer: Self-pay | Admitting: Hematology

## 2018-10-06 LAB — TYPE AND SCREEN
ABO/RH(D): AB POS
Antibody Screen: NEGATIVE
UNIT DIVISION: 0
Unit division: 0
Unit division: 0
Unit division: 0
Unit division: 0

## 2018-10-06 LAB — BPAM RBC
Blood Product Expiration Date: 202003222359
Blood Product Expiration Date: 202003222359
Blood Product Expiration Date: 202004092359
Blood Product Expiration Date: 202004092359
Blood Product Expiration Date: 202004102359
ISSUE DATE / TIME: 202003170523
ISSUE DATE / TIME: 202003170523
ISSUE DATE / TIME: 202003170626
ISSUE DATE / TIME: 202003190312
Unit Type and Rh: 6200
Unit Type and Rh: 6200
Unit Type and Rh: 8400
Unit Type and Rh: 8400
Unit Type and Rh: 8400

## 2018-10-07 ENCOUNTER — Telehealth: Payer: Self-pay | Admitting: Pulmonary Disease

## 2018-10-07 ENCOUNTER — Ambulatory Visit: Payer: Medicare Other | Admitting: Physician Assistant

## 2018-10-07 NOTE — Telephone Encounter (Signed)
10/06/2018 Received signed D/C back from Dr. Lake Bells. Will call [employer] [facility] to pick up. PWR

## 2018-10-09 LAB — CULTURE, BLOOD (ROUTINE X 2)
Culture: NO GROWTH
Culture: NO GROWTH
Special Requests: ADEQUATE

## 2018-10-14 LAB — ADAMTS13 ANTIBODY: ADAMTS13 Antibody: 4 Units/mL (ref <12)

## 2018-10-14 LAB — ADAMTS13 ACTIVITY: Adamts 13 Activity: 28.6 % — CL (ref >66.8)

## 2018-10-19 NOTE — Significant Event (Addendum)
PCCM Interval Note   RN reports liable BP followed by sudden respiratory arrest with desaturation down to 40.  Code was called.  There was no loss of pulse.  Patient required BVM with increase in sats but was difficult to bag.  SBP has high 220.  Pressors off.   Patient was emergently intubated by Dr. Carson Myrtle with OGT placement.    Additionally, patient noted to have anisocoria post intubation with R pupils larger than left, but both still brisk.  Requiring vasopressors again post intubation.   0330 labs noted for Hgb 9.5- 8 and platelets < 5  P:  Stat CT head to rule out ICH given BP extremes and coagulopathies  Ongoing neuro checks  Additional platelets 2 pack and 2 units of PRBC Continue BMP q 4 and CBC post transfusion Goal MAP > 65 PRVC 8 cc/kg, rate 20 CXR now ABG in 1 hr PAD protocol with continuous low dose fentanyl gtt, prn versed for RASS goal 0/-1 w/bowel regimen   Patient's sister, Verdie Drown called and updated.  She is now on her way to hospital.    ADDENDUM- called by Radiologist in reference to concern of possible right pneumoperitoneum vs atelectasis.  Will add on CT abd/ pelvis to r/o perforation.    CCT 30 mins  Kennieth Rad, MSN, AGACNP-BC [facility] Pgr: [phone] or if no answer [phone] Oct 10, 2018, 5:13 AM

## 2018-10-19 NOTE — Progress Notes (Signed)
Carrie Carrie Chaney, MRN:  [id number], DOB:  Apr 09, 1941, LOS: 1 ADMISSION DATE:  09/30/2018, CONSULTATION DATE:  10/10/2018 REFERRING MD:  Dr. Alcario Drought, CHIEF COMPLAINT:  AMS  Brief History   81 yoF presenting from SNF with new onset x 1 day altered mental status with slurred speech and generalized weakness.  Code stroke called in ER, with neg CT/ CTA head/ neck.   New AKI, hyperkalemia, and severe thrombocytopenia of 9.    History of present illness   78 year old female with history of hypertension, hyperlipidemia, DM, and recent diagnosis of metastatic adenocarcinoma presenting from SNF with altered mental status.    Recently hospitalized 2/19 to 3/3 with progressive extremity swelling found ot have metastatic adenocarcinoma of unknown primary.  CT abd/pelvis showing metastatic disease in the liver, right kidney, retroperitoneal lymph nodes, 4 cm sigmoid mass.  Liver biopsy confirmed adenocarcinoma, underwent EGD and colonoscopy.  She is being followed by oncology, initial visit was scheduled for 3/17.     Patient's two Carrie Chaney at bedside report that patient was very active and functional prior to 2/19 admit.  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 Additionally since discharge and being at SNF, she has not been eating well, mainly drinking fluids only.    In ER, code stroke called with neg CT head and CTA head/ neck with neurology consulting.  Patient has been afebrile and hemodynamically stable.  Labs noted for  Na 142, K 6.6, Cl 112, glucose 144, BUN 112, sCr 3.11, increased transaminitis from last admit, Lactic 4.5, WBC 6.7, Hgb 11.2, Platelets 9 (decreased from  353 on 09/19/2018), INR 1.4, PT 16.9, DIC panel and smear pending, UA noted for pyuria.  PCCM called for concern and possible admit to ICU.  Past Medical History  HTN, HLD, metastatic adenocarcinoma with unclear primary, DM  Significant Hospital Events   2/19- 3/3  hospitalized, d/c SNF 3/16 admit 3/17 withdrawal / comfort care  Consults:  Neurology Oncology   Procedures:  ETT 3/16 > 3/17 L IJ HD cath 3/16 > 3/17 L radial art line 3/16 > 3/17  Significant Diagnostic Tests:  3/16 CTH >> neg 3/16 CTA head/ neck >> no acute process, increased size of known sigmoid colon mass. CT A / P 3/16 >> sigmoid perforation, known extensive malignancy in the liver with direct invasion of the superior pole of right kidney.  Micro Data:  3/16 BC x2 >> 3/16 UC >>  Antimicrobials:  3/16 ceftriaxone >>  Interim history/subjective:  On levo at 40 and vaso at 0.04.  Unresponsive.  Having bloody stools.  Chart reviewed and case discussed with Dr. Burr Medico of oncology who is in agreement that we have maximized therapies and at this point, continued aggressive measures would be futile in light of Carrie Carrie Chaney's underlying malignancy. I have had an extensive discussion with Carrie Carrie Chaney regarding the current circumstances and extremely poor prognosis given no chance of meaningful recovery.  We also discussed the patient's prior wishes under circumstances such as this.  The Carrie Chaney has decided to offer full comfort care for her.  They have been fully updated on the process and expectations and all questions have been answered.   Objective   Blood pressure (!) 86/74, pulse 92, temperature (!) 97.4 F (36.3 C), temperature source Axillary, resp. rate 20, weight 115.8 kg, SpO2 99 %.    Vent Mode: PRVC FiO2 (%):  [70 %-100 %] 70 %  Set Rate:  [14 bmp-20 bmp] 20 bmp Vt Set:  [500 mL] 500 mL PEEP:  [5 cmH20] 5 cmH20 Plateau Pressure:  [19 cmH20-24 cmH20] 24 cmH20   Intake/Output Summary (Last 24 hours) at October 28, 2018 0857 Last data filed at 10/28/18 0800 Gross per 24 hour  Intake 4527.62 ml  Output 100 ml  Net 4427.62 ml   Filed Weights   10-28-2018 0500  Weight: 115.8 kg    Examination: General: Adult female, critically ill. Neuro:  Unresponsive despite no sedation. HEENT: [place]/AT. Sclerae anicteric. ETT in place. Cardiovascular: RRR, no M/R/G.  Lungs: Respirations even and unlabored.  CTA bilaterally, No W/R/R. Abdomen: Obese. BS x 4, soft, NT/ND.  Musculoskeletal: No gross deformities, no edema.  Skin: Erythematous punctate rashes to bilateral UE's.  Skin warm.  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  Not a good candidate for chemo per discussion with Dr. Burr Medico. Septic shock - due to peritonitis in setting of perforated sigmoid colon + UTI.  Now complicated by anemia due to GI bleed, metabolic acidosis, severe thrombocytopenia, respiratory insufficiency requiring intubation s/p brief respiratory arrest due to above. Acute encephalopathy - due to above. Failure to thrive - due to above.  Discussion / Plan: Chart reviewed and case discussed with Dr. Burr Medico of oncology who is in agreement that we have maximized therapies and at this point, continued aggressive measures would be futile in light of Carrie Carrie Chaney's underlying malignancy. I have had an extensive discussion with Carrie Carrie Chaney regarding the current circumstances and extremely poor prognosis given no chance of meaningful recovery.  We also discussed the patient's prior wishes under circumstances such as this.  The Carrie Chaney has decided to offer full comfort care for her.  They have been fully updated on the process and expectations and all questions have been answered.   Best practice:  Diet: NPO Pain/Anxiety/Delirium protocol (if indicated): Morphine PRN VAP protocol (if indicated): terminal extubation DVT prophylaxis: none GI prophylaxis: none Glucose control: none Mobility: BR Code Status: DNR with full comfort care Carrie Chaney Communication: Carrie Chaney updated in conference room.  They are in agreement for withdrawal of life sustaining measures and full comfort care. Disposition: ICU   CC time:  90 minutes including coordination of care with Dr. Burr Medico as well as several Carrie Chaney updates.   Montey Hora, [facility] Pager: [phone].  If no answer, [phone] 10-28-2018, 9:38 AM

## 2018-10-19 NOTE — Progress Notes (Signed)
PCCM Interval Note   Called and spoke with Dr. Jana Hakim.  Unfortunately, PLEX therapy is not available still 8am.    Additionally, patient's sister, Carrie Chaney [phone], and HCPOA called and updated of patient's decompensation throughout the night, including two melenotic stools and now on levophed at 30 mcg and adding vasopressor.  She is s/p 2 units FFP and 2 pack platelets.  Repeat labs pending.  She is still compensating and protecting her airway at this time.  Given her acute illness and known underlying metastatic disease, code status was approached with sister.  If patient were to decompensate to full cardiac arrest despite ongoing aggressive measures, I feel that CPR would not benefit but likely injury given her severe thrombocytopenia.  At this time, patient's sister Carrie Chaney would like to discuss this with her sister before making a final decision but likely will agree.  For now, patient remains a full code.     Kennieth Rad, MSN, AGACNP-BC [facility] Pgr: [phone] or if no answer [phone] 10-25-18, 4:09 AM

## 2018-10-19 NOTE — Progress Notes (Signed)
Transported pt to and from CT without event.

## 2018-10-19 NOTE — Progress Notes (Signed)
Doctor to call sister [name] to tell her about the code. Conard Novak, Chaplain   2018-10-11 0400  Clinical Encounter Type  Visited With Health care provider  Visit Type Code  Referral From  (Code Blue)  Consult/Referral To Chaplain

## 2018-10-19 NOTE — Death Summary Note (Signed)
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 year old female who 78 year old female with history of hypertension, hyperlipidemia, DM, and recent diagnosis of metastatic adenocarcinoma presenting from SNF with altered mental status.male who 78 year old female with history of hypertension, hyperlipidemia, DM, and recent diagnosis of metastatic adenocarcinoma presenting from SNF with altered mental status.    Recently hospitalized 2/19 to 3/3 with progressive extremity swelling found ot have metastatic adenocarcinoma of unknown primary.  CT abd/pelvis showing metastatic disease in the liver, right kidney, retroperitoneal lymph nodes, 4 cm sigmoid mass.  Liver biopsy confirmed adenocarcinoma, underwent EGD and colonoscopy.  She is being followed by oncology, initial visit was scheduled for October 24, 2022.     Patient's two sisters at bedside report that patient was very active and functional prior to 2/19 admit.  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 Additionally since discharge and being at SNF, she has not been eating well, mainly drinking fluids  only.    In ER, code stroke called with neg CT head and CTA head/ neck with neurology consulting.  Patient has been afebrile and hemodynamically stable.  Labs noted for  Na 142, K 6.6, Cl 112, glucose 144, BUN 112, sCr 3.11, increased transaminitis from last admit, Lactic 4.5, WBC 6.7, Hgb 11.2, Platelets 9 (decreased from  353 on 09/19/2018), INR 1.4, PT 16.9, DIC panel and smear pending, UA noted for pyuria.  PCCM called for concern and possible admit to ICU.  She was admitted to the ICU and overnight her conditioned worsened in that she had increased shock.  An attempt was made to treat possible TTP.  Her considition worsened despite maximal life support.  In discussion with the patient's primary oncologist and the patient's daughters the decision was made to withdraw care as it was felt that her treatment was not medically beneficial.   Pertinent Labs and Studies  Significant Diagnostic Studies Ct Abdomen Pelvis Wo Contrast  Result Date: October 24, 2018 CLINICAL DATA:  Rule out pneumoperitoneum EXAM: CT ABDOMEN AND PELVIS WITHOUT CONTRAST TECHNIQUE: Multidetector CT imaging of the abdomen and pelvis was performed following the standard protocol without IV contrast. COMPARISON:  Abdominal CT 09/08/2018 FINDINGS: Lower chest:  Streaky opacity at the bases from atelectasis. Hepatobiliary: Extensive metastatic disease, known. Small volume adjacent ascites.No evidence of biliary obstruction or stone. Pancreas: Unremarkable. Spleen: Small spleen with adjacent ascites. Adrenals/Urinary Tract: Negative adrenals. No hydronephrosis or stone. Left renal cyst. The liver mass invades the superior pole right kidney. The bladder is decompressed by Foley catheter Stomach/Bowel: Extensive sigmoid mesenteric edema tracking in the retroperitoneal spaces and also decompressing into the peritoneal space with  moderate pneumoperitoneum. There is a known mass along the sigmoid colon measuring 5 cm. Negative for bowel obstruction.  Orogastric tube with tip in the stomach. Vascular/Lymphatic: Extensive atherosclerotic calcification. No mass or adenopathy. Reproductive:No acute finding Other: Trace ascites.  Pneumoperitoneum, as above Musculoskeletal: Advanced degenerative disease of the lumbar spine. No acute or aggressive finding. Critical Value/emergent results were called by telephone at the time of interpretation on 10-24-2018 at 6:16 am to Dr. Jennelle Human , who verbally acknowledged these results. IMPRESSION: 1. Sigmoid colon perforation with extensive retroperitoneal and intraperitoneal gas. Small volume ascites. This could complicated diverticulitis or a complication of a known sigmoid colon mass which has increased in size from 09/08/2018. 2. Known extensive malignancy in the liver with direct invasion of the superior pole right kidney. Electronically Signed   By: Monte Fantasia M.D.   On: 2018-10-24 06:16   Ct Angio Head W Or Wo Contrast  Result Date: 09/22/2018 CLINICAL DATA:  Code stroke. Altered mental status. EXAM: CT ANGIOGRAPHY HEAD AND NECK TECHNIQUE: Multidetector CT imaging of the head and neck was performed using the standard protocol during bolus administration of intravenous contrast. Multiplanar CT image reconstructions and MIPs were obtained to evaluate the vascular anatomy. Carotid stenosis measurements (when applicable) are obtained utilizing NASCET criteria, using the distal internal carotid diameter as the denominator. CONTRAST:  45mL OMNIPAQUE IOHEXOL 350 MG/ML SOLN COMPARISON:  Code stroke CT head earlier today. FINDINGS: CTA NECK FINDINGS Aortic arch: Standard branching. Imaged portion shows no evidence of aneurysm or dissection. 50-75% stenosis of the proximal LEFT subclavian, otherwise no significant stenosis of the major arch vessel origins. Right carotid system: No evidence of dissection, stenosis (50% or greater) or occlusion. Calcific atheromatous change at the bifurcation. Left carotid system: No  evidence of dissection, stenosis (50% or greater) or occlusion. Calcific atheromatous change at the bifurcation. Vertebral arteries: Dominant RIGHT vertebral. 50-75% stenosis LEFT vertebral origin, with adjacent ostial calcification. Skeleton: Spondylosis. Other neck: Noncontributory. Upper chest: Superior segment RIGHT lower lobe atelectasis. subpleural blebs along the medial RIGHT upper lobe. Review of the MIP images confirms the above findings CTA HEAD FINDINGS Anterior circulation: No significant stenosis, proximal occlusion, aneurysm, or vascular malformation. Posterior circulation: No significant stenosis, proximal occlusion, aneurysm, or vascular malformation. 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 Venous sinuses: As permitted by contrast timing, patent. Anatomic variants: BILATERAL fetal PCA. Delayed phase: Not performed. Review of the MIP images confirms the above findings IMPRESSION: 1. No intracranial or extracranial anterior circulation stenosis of significance. 2. 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 4. Subpleural blebs along the medial RIGHT upper lobe. Superior segment RIGHT lower lobe atelectasis. 5. Based on the decision of the ordering provider at the scanner, CT perfusion was canceled. These results were communicated to Dr. Leonel Ramsay at [facility] pmon 03/18/2020by text page via the [facility] messaging system. Electronically Signed   By: Staci Righter M.D.   On: 10/03/2018 18:26   Dg Chest 2 View  Result Date: 09/07/2018 CLINICAL DATA:  Swelling of the left lower extremity for a month. Dyspnea. EXAM: CHEST - 2 VIEW COMPARISON:  None. FINDINGS: Mild cardiomegaly with aortic atherosclerosis. Central vascular congestion consistent with mild CHF is noted. Atelectasis is seen  at the right lung base. IMPRESSION: Cardiomegaly with aortic atherosclerosis. Central vascular congestion consistent with mild CHF. Electronically Signed   By:  [name] M.D.   On: 09/07/2018 20:01   Ct Head Wo Contrast  Result Date: 10/10/2018 CLINICAL DATA:  Coagulopathy and extreme blood pressure. Concern for intracranial hemorrhage. EXAM: CT HEAD WITHOUT CONTRAST TECHNIQUE: Contiguous axial images were obtained from the base of the skull through the vertex without intravenous contrast. COMPARISON:  CTA head neck 10/02/2018 FINDINGS: Brain: There is no mass, hemorrhage or extra-axial collection. The size and configuration of the ventricles and extra-axial CSF spaces are normal. The brain parenchyma is normal, without acute or chronic infarction. Vascular: No abnormal hyperdensity of the major intracranial arteries or dural venous sinuses. No intracranial atherosclerosis. Skull: The visualized skull base, calvarium and extracranial soft tissues are normal. Sinuses/Orbits: No fluid levels or advanced mucosal thickening of the visualized paranasal sinuses. No mastoid or middle ear effusion. The orbits are normal. IMPRESSION: Normal aging brain. Electronically Signed   By: Ulyses Jarred M.D.   On: 10-10-2018 06:09   Ct Angio Neck W Or Wo Contrast  Result Date: 09/19/2018 CLINICAL DATA:  Code stroke. Altered mental status. EXAM: CT ANGIOGRAPHY HEAD AND NECK TECHNIQUE: Multidetector CT imaging of the head and neck was performed using the standard protocol during bolus administration of intravenous contrast. Multiplanar CT image reconstructions and MIPs were obtained to evaluate the vascular anatomy. Carotid stenosis measurements (when applicable) are obtained utilizing NASCET criteria, using the distal internal carotid diameter as the denominator. CONTRAST:  36mL OMNIPAQUE IOHEXOL 350 MG/ML SOLN COMPARISON:  Code stroke CT head earlier today. FINDINGS: CTA NECK FINDINGS Aortic arch: Standard branching. Imaged  portion shows no evidence of aneurysm or dissection. 50-75% stenosis of the proximal LEFT subclavian, otherwise no significant stenosis of the major arch vessel origins. Right carotid system: No evidence of dissection, stenosis (50% or greater) or occlusion. Calcific atheromatous change at the bifurcation. Left carotid system: No evidence of dissection, stenosis (50% or greater) or occlusion. Calcific atheromatous change at the bifurcation. Vertebral arteries: Dominant RIGHT vertebral. 50-75% stenosis LEFT vertebral origin, with adjacent ostial calcification. Skeleton: Spondylosis. Other neck: Noncontributory. Upper chest: Superior segment RIGHT lower lobe atelectasis. subpleural blebs along the medial RIGHT upper lobe. Review of the MIP images confirms the above findings CTA HEAD FINDINGS Anterior circulation: No significant stenosis, proximal occlusion, aneurysm, or vascular malformation. Posterior circulation: No significant stenosis, proximal occlusion, aneurysm, or vascular malformation. 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 Venous sinuses: As permitted by contrast timing, patent. Anatomic variants: BILATERAL fetal PCA. Delayed phase: Not performed. Review of the MIP images confirms the above findings IMPRESSION: 1. No intracranial or extracranial anterior circulation stenosis of significance. 2. 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 4. Subpleural blebs along the medial RIGHT upper lobe. Superior segment RIGHT lower lobe atelectasis. 5. Based on the decision of the ordering provider at the scanner, CT perfusion was canceled. These results were communicated to Dr. Leonel Ramsay at [facility] pmon 03/05/2020by text page via the [facility] messaging system. Electronically Signed   By: Staci Righter M.D.   On: 10/03/2018 18:26   Ct Chest W Contrast  Result Date: 09/08/2018 CLINICAL DATA:  Liver masses on ultrasound earlier today.  Anasarca. EXAM: CT CHEST, ABDOMEN, AND PELVIS WITH CONTRAST TECHNIQUE: Multidetector CT imaging of the chest, abdomen and pelvis was performed following the standard protocol during bolus administration of intravenous contrast. CONTRAST:  162mL OMNIPAQUE IOHEXOL 300  MG/ML SOLN, 31mL OMNIPAQUE IOHEXOL 300 MG/ML SOLN COMPARISON:  Ultrasound exam 09/08/2018 FINDINGS: CT CHEST FINDINGS Cardiovascular: The heart is enlarged. Coronary artery calcification is evident. Atherosclerotic calcification is noted in the wall of the thoracic aorta. Mediastinum/Nodes: No mediastinal lymphadenopathy. There is no hilar lymphadenopathy. There is no axillary lymphadenopathy. The esophagus has normal imaging features. Lungs/Pleura: The central tracheobronchial airways are patent. Subsegmental atelectasis noted in the dependent lower lungs bilaterally. No suspicious pulmonary nodule or mass. No substantial pleural effusion. Musculoskeletal: No worrisome lytic or sclerotic osseous abnormality. CT ABDOMEN PELVIS FINDINGS Hepatobiliary: As seen on the recent ultrasound exam, there are multiple liver masses, bulky year in the right lobe and in the left. Index lesion in the medial right liver measures 6.9 x 5.0 cm on 54/2. Index lesion in the medial segment left liver measures 2.5 cm. A large exophytic lesion seen from the inferior right liver extends into Morison's pouch and generates substantial mass-effect on the right kidney. There is no evidence for gallstones, gallbladder wall thickening, or pericholecystic fluid. No intrahepatic or extrahepatic biliary dilation. Pancreas: No focal mass lesion. No dilatation of the main duct. No intraparenchymal cyst. No peripancreatic edema. Spleen: No splenomegaly. No focal mass lesion. Adrenals/Urinary Tract: Right adrenal gland not visualized. Left adrenal  gland unremarkable. 4.1 cm cyst noted in the upper pole left kidney. The mass in the inferior right liver extending down to the upper pole the right kidney does generate mass-effect on the right kidney but in some regions, appears to extend into the upper pole parenchyma of the right kidney (see coronal image 96 of series 5). 7 clear whether this is an exophytic mass arising from the upper right kidney or potentially hepatic origin invading the upper pole the right kidney. No evidence for hydroureter. The urinary bladder appears normal for the degree of distention. Stomach/Bowel: Stomach is unremarkable. No gastric wall thickening. No evidence of outlet obstruction. Duodenum is normally positioned as is the ligament of Treitz. No small bowel wall thickening. No small bowel dilatation. The terminal ileum is normal. The appendix is not visualized, but there is no edema or inflammation in the region of the cecum. Right colon and transverse colon are unremarkable. Diverticular changes are noted in the left colon. 3.4 x 3.8 cm soft tissue mass is identified along the sigmoid colon (99/2). Vascular/Lymphatic: There is abdominal aortic atherosclerosis without aneurysm. IVC is displaced to the left by the inferior right hepatic disease. 1.6 cm short axis hepato duodenal ligament lymph node evident on 58/2. 1.9 cm lymph node in the porta hepatis visible on 62/2. 10 mm short axis aortocaval lymph node visible on 72/2. No pelvic sidewall lymphadenopathy. Reproductive: Uterus surgically absent.  There is no adnexal mass. Other: Small volume free fluid seen adjacent to the liver and spleen as well as in the cul-de-sac. 16 mm perigastric nodule noted on 67/2. Similar adjacent nodule measures up to 2.0 cm. Multiple omental nodules are evident including 2.0 x 1.4 cm left omental nodule on 88/2. Musculoskeletal: No worrisome lytic or sclerotic osseous abnormality. IMPRESSION: 1. Bulky metastatic disease identified in the liver, with  inferior right liver essentially replaced by tumor. 2. Bulky metastatic disease is identified between the right liver in the upper pole the right kidney. This appears to involve both the liver and the kidney and is unclear whether this is primarily from the liver invading the upper pole the right kidney or potentially a right upper pole renal neoplasm invading the liver. 3. Perigastric, porta hepatis, and  omental metastatic disease. Borderline enlarged retroperitoneal lymph nodes also suggest metastatic involvement. 4. 4 cm soft tissue mass associated with the sigmoid colon. While this could certainly be a primary colonic neoplasm, it has a severe coal configuration and does not have typical colorectal cancer CT imaging appearance. Given the metastatic disease elsewhere in the abdomen/pelvis, this could also represent metastatic disease in the pelvis. 5. Given the findings immediately above, no obvious primary source for the diffuse metastatic disease is evident. If the lesion along the sigmoid colon ends up being colorectal cancer, than the other findings in the abdomen and pelvis are certainly consistent with metastatic disease. Right adrenal or right renal cancer with direct liver invasion along with liver metastases, peritoneal/omental disease and metastatic lymphadenopathy would also be a consideration. 6. No evidence for metastatic disease in the thorax. 7. Aortic Atherosclerois (ICD10-170.0) Electronically Signed   By: Misty Stanley M.D.   On: 09/08/2018 18:30   Ct Abdomen Pelvis W Contrast  Result Date: 09/08/2018 CLINICAL DATA:  Liver masses on ultrasound earlier today.  Anasarca. EXAM: CT CHEST, ABDOMEN, AND PELVIS WITH CONTRAST TECHNIQUE: Multidetector CT imaging of the chest, abdomen and pelvis was performed following the standard protocol during bolus administration of intravenous contrast. CONTRAST:  143mL OMNIPAQUE IOHEXOL 300 MG/ML SOLN, 36mL OMNIPAQUE IOHEXOL 300 MG/ML SOLN COMPARISON:   Ultrasound exam 09/08/2018 FINDINGS: CT CHEST FINDINGS Cardiovascular: The heart is enlarged. Coronary artery calcification is evident. Atherosclerotic calcification is noted in the wall of the thoracic aorta. Mediastinum/Nodes: No mediastinal lymphadenopathy. There is no hilar lymphadenopathy. There is no axillary lymphadenopathy. The esophagus has normal imaging features. Lungs/Pleura: The central tracheobronchial airways are patent. Subsegmental atelectasis noted in the dependent lower lungs bilaterally. No suspicious pulmonary nodule or mass. No substantial pleural effusion. Musculoskeletal: No worrisome lytic or sclerotic osseous abnormality. CT ABDOMEN PELVIS FINDINGS Hepatobiliary: As seen on the recent ultrasound exam, there are multiple liver masses, bulky year in the right lobe and in the left. Index lesion in the medial right liver measures 6.9 x 5.0 cm on 54/2. Index lesion in the medial segment left liver measures 2.5 cm. A large exophytic lesion seen from the inferior right liver extends into Morison's pouch and generates substantial mass-effect on the right kidney. There is no evidence for gallstones, gallbladder wall thickening, or pericholecystic fluid. No intrahepatic or extrahepatic biliary dilation. Pancreas: No focal mass lesion. No dilatation of the main duct. No intraparenchymal cyst. No peripancreatic edema. Spleen: No splenomegaly. No focal mass lesion. Adrenals/Urinary Tract: Right adrenal gland not visualized. Left adrenal gland unremarkable. 4.1 cm cyst noted in the upper pole left kidney. The mass in the inferior right liver extending down to the upper pole the right kidney does generate mass-effect on the right kidney but in some regions, appears to extend into the upper pole parenchyma of the right kidney (see coronal image 96 of series 5). 7 clear whether this is an exophytic mass arising from the upper right kidney or potentially hepatic origin invading the upper pole the right  kidney. No evidence for hydroureter. The urinary bladder appears normal for the degree of distention. Stomach/Bowel: Stomach is unremarkable. No gastric wall thickening. No evidence of outlet obstruction. Duodenum is normally positioned as is the ligament of Treitz. No small bowel wall thickening. No small bowel dilatation. The terminal ileum is normal. The appendix is not visualized, but there is no edema or inflammation in the region of the cecum. Right colon and transverse colon are unremarkable. Diverticular changes are noted  in the left colon. 3.4 x 3.8 cm soft tissue mass is identified along the sigmoid colon (99/2). Vascular/Lymphatic: There is abdominal aortic atherosclerosis without aneurysm. IVC is displaced to the left by the inferior right hepatic disease. 1.6 cm short axis hepato duodenal ligament lymph node evident on 58/2. 1.9 cm lymph node in the porta hepatis visible on 62/2. 10 mm short axis aortocaval lymph node visible on 72/2. No pelvic sidewall lymphadenopathy. Reproductive: Uterus surgically absent.  There is no adnexal mass. Other: Small volume free fluid seen adjacent to the liver and spleen as well as in the cul-de-sac. 16 mm perigastric nodule noted on 67/2. Similar adjacent nodule measures up to 2.0 cm. Multiple omental nodules are evident including 2.0 x 1.4 cm left omental nodule on 88/2. Musculoskeletal: No worrisome lytic or sclerotic osseous abnormality. IMPRESSION: 1. 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 2. Bulky metastatic disease is identified between the right liver in the upper pole the right kidney. This appears to involve both the liver and the kidney and is unclear whether this is primarily from the liver invading the upper pole the right kidney or potentially a right upper pole renal neoplasm invading the liver. 3. Perigastric, porta hepatis, and omental metastatic disease. Borderline enlarged  retroperitoneal lymph nodes also suggest metastatic involvement. 4. 4 cm soft tissue mass associated with the sigmoid colon. While this could certainly be a primary colonic neoplasm, it has a severe coal configuration and does not have typical colorectal cancer CT imaging appearance. Given the metastatic disease elsewhere in the abdomen/pelvis, this could also represent metastatic disease in the pelvis. 5. Given the findings immediately above, no obvious primary source for the diffuse metastatic disease is evident. If the lesion along the sigmoid colon ends up being colorectal cancer, than the other findings in the abdomen and pelvis are certainly consistent with metastatic disease. Right adrenal or right renal cancer with direct liver invasion along with liver metastases, peritoneal/omental disease and metastatic lymphadenopathy would also be a consideration. 6. No evidence for metastatic disease in the thorax. 7. Aortic Atherosclerois (ICD10-170.0) Electronically Signed   By: Misty Stanley M.D.   On: 09/08/2018 18:30   US Biopsy (liver)  Result Date: 09/09/2018 INDICATION: LIVER MASS, SUSPECT COLON PRIMARY EXAM: ULTRASOUND LEFT LIVER MASS BIOPSY MEDICATIONS: 1% lidocaine local ANESTHESIA/SEDATION: Moderate (conscious) sedation was employed during this procedure. A total of Versed 1.0 mg and Fentanyl 50 mcg was administered intravenously. Moderate Sedation Time: 10 minutes. The patient's level of consciousness and vital signs were monitored continuously by radiology nursing throughout the procedure under my direct supervision. FLUOROSCOPY TIME:  Fluoroscopy Time: None. COMPLICATIONS: None immediate. PROCEDURE: Informed written consent was obtained from the patient after a thorough discussion of the procedural risks, benefits and alternatives. All questions were addressed. Maximal Sterile Barrier Technique was utilized including caps, mask, sterile gowns, sterile gloves, sterile drape, hand hygiene and skin  antiseptic. A timeout was performed prior to the initiation of the procedure. Previous imaging reviewed. Patient positioned supine. Preliminary ultrasound performed. A left lobe lesion is demonstrated in the epigastric region. Overlying skin marked. Under sterile conditions and local anesthesia, a 17 gauge 6.8 cm access needle was advanced from an anterior epigastric approach to the lesion. Needle position confirmed with ultrasound. 18 gauge core biopsies obtained under direct ultrasound. Images obtained for documentation. Samples were placed in formalin. Needle removed. Patient tolerated the biopsy well. No immediate complication IMPRESSION: Successful ultrasound left liver mass  18 gauge core biopsy Electronically Signed   By: Jerilynn Mages.  Shick M.D.   On: 09/09/2018 14:40   Portable Chest X-ray  Result Date: 11/01/2018 CLINICAL DATA:  Intubation EXAM: PORTABLE CHEST 1 VIEW COMPARISON:  Earlier today FINDINGS: New endotracheal tube with tip at the clavicular heads. New orogastric tube which at least reaches the stomach. Left-sided central line with tip not crossing midline, in the location where arterial and venous structures overlap. This is not changed from prior. Borderline heart size. Low volume chest with atelectatic type opacity. Curvilinear lucency at the medial right diaphragm. These results were called by telephone at the time of interpretation on 2018/11/01 at 5:21 am to NP [facility]. There is plan for noncontrast abdominal CT. IMPRESSION: 1. New endotracheal and orogastric tubes in unremarkable position. 2. Curvilinear lucency at the right diaphragm which could be atelectasis or pneumoperitoneum. 3. Unchanged left IJ catheter with tip left of midline. Electronically Signed   By: Monte Fantasia M.D.   On: 11-01-2018 05:21   Dg Chest Port 1 View  Result Date: 11-01-2018 CLINICAL DATA:  Central line placement EXAM: PORTABLE CHEST 1 VIEW COMPARISON:  09/07/2018 FINDINGS: Left internal jugular Vas-Cath has been  placed with the tip at the confluence of the innominate veins. No pneumothorax. Cardiomegaly with vascular congestion. Right base atelectasis. No effusions or overt edema. No acute bony abnormality. IMPRESSION: Left central line tip at the confluence of the innominate veins. No pneumothorax. Cardiomegaly, vascular congestion. Right base atelectasis. Electronically Signed   By: Rolm Baptise M.D.   On: 11-01-2018 03:05   Ct Head Code Stroke Wo Contrast  Result Date: 09/22/2018 CLINICAL DATA:  Code stroke.  Altered mental status. EXAM: CT HEAD WITHOUT CONTRAST TECHNIQUE: Contiguous axial images were obtained from the base of the skull through the vertex without intravenous contrast. COMPARISON:  08/14/2016 FINDINGS: Brain: There is no evidence of acute infarct, intracranial hemorrhage, mass, midline shift, or extra-axial fluid collection. A cavum septum pellucidum et vergae is incidentally noted. The ventricles and sulci are within normal limits for age. Vascular: Calcified atherosclerosis at the skull base. No hyperdense vessel. Skull: No fracture or focal osseous lesion. Sinuses/Orbits: Visualized paranasal sinuses and mastoid air cells are clear. Were bits are unremarkable. Other: None. ASPECTS [facility] Stroke Program Early CT Score) Not scored with this history. IMPRESSION: No evidence of acute intracranial abnormality. These results were communicated to Dr. Leonel Ramsay at 5:40 pm on 10/18/2018 by text page via the [facility] messaging system. Electronically Signed   By: Logan Bores M.D.   On: 10/15/2018 17:41   Vas [place] Upper Extremity Venous Duplex  Result Date: 09/12/2018 UPPER VENOUS STUDY  Indications: Swelling Performing Technologist: Oliver Hum RVT  Examination Guidelines: A complete evaluation includes B-mode imaging, spectral Doppler, color Doppler, and power Doppler as needed of all accessible portions of each vessel. Bilateral testing is considered an integral part of a complete examination.  Limited examinations for reoccurring indications may be performed as noted.  Right Findings: +----------+------------+----------+---------+-----------+-------+ RIGHT     CompressiblePropertiesPhasicitySpontaneousSummary +----------+------------+----------+---------+-----------+-------+ IJV           Full                 Yes       Yes            +----------+------------+----------+---------+-----------+-------+ Subclavian    Full                 Yes       Yes            +----------+------------+----------+---------+-----------+-------+  Axillary      Full                 Yes       Yes            +----------+------------+----------+---------+-----------+-------+ Brachial      Full                 Yes       Yes            +----------+------------+----------+---------+-----------+-------+ Radial        Full                                          +----------+------------+----------+---------+-----------+-------+ Ulnar         Full                                          +----------+------------+----------+---------+-----------+-------+ Cephalic      Full                                          +----------+------------+----------+---------+-----------+-------+ Basilic       Full                                          +----------+------------+----------+---------+-----------+-------+  Left Findings: +----------+------------+----------+---------+-----------+-------+ LEFT      CompressiblePropertiesPhasicitySpontaneousSummary +----------+------------+----------+---------+-----------+-------+ Subclavian    Full                 Yes       Yes            +----------+------------+----------+---------+-----------+-------+  Summary:  Right: No evidence of deep vein thrombosis in the upper extremity. No evidence of superficial vein thrombosis in the upper extremity.  Left: No evidence of thrombosis in the subclavian.  *See table(s) above for  measurements and observations.  Diagnosing physician: Ruta Hinds MD Electronically signed by Ruta Hinds MD on 09/12/2018 at 3:30:20 PM.    Final    US Abdomen Limited Ruq  Result Date: 09/08/2018 CLINICAL DATA:  78 year old female with abnormal LFTs.  Anasarca. EXAM: ULTRASOUND ABDOMEN LIMITED RIGHT UPPER QUADRANT COMPARISON:  None. FINDINGS: Gallbladder: No gallstones or wall thickening visualized. No sonographic Murphy sign noted by sonographer. Common bile duct: Diameter: 4 millimeters, normal. Liver: Abnormal, highly irregular and heterogeneous liver echogenicity suggesting multiple liver masses ranging from 2.5 centimeters to 10.5 centimeters diameter (images 18, 35, 43). The liver contour seems to remain relatively smooth (image 31). Portal vein is patent on color Doppler imaging with normal direction of blood flow towards the liver. Other findings: No ascites. IMPRESSION: Positive for multiple liver masses without obvious cirrhosis. Favor metastatic disease over primary liver tumor. CT chest abdomen and pelvis (oral and IV contrast preferred) recommended. Electronically Signed   By: Genevie Ann M.D.   On: 09/08/2018 02:20    Microbiology Recent Results (from the past 240 hour(s))  Urine culture     Status: Abnormal   Collection Time: 09/28/2018  6:24 PM  Result Value Ref Range Status   Specimen Description URINE, RANDOM  Final   Special Requests  Final    NONE Performed at [facility], [place] [address], [place], [place] [address]    Culture >=100,000 COLONIES/mL ESCHERICHIA COLI (A)  Final   Report Status 10/05/2018 FINAL  Final   Organism ID, Bacteria ESCHERICHIA COLI (A)  Final      Susceptibility   Escherichia coli - MIC*    AMPICILLIN <=2 SENSITIVE Sensitive     CEFAZOLIN <=4 SENSITIVE Sensitive     CEFTRIAXONE <=1 SENSITIVE Sensitive     CIPROFLOXACIN <=0.25 SENSITIVE Sensitive     GENTAMICIN <=1 SENSITIVE Sensitive     IMIPENEM <=0.25 SENSITIVE Sensitive      NITROFURANTOIN <=16 SENSITIVE Sensitive     TRIMETH/SULFA <=20 SENSITIVE Sensitive     AMPICILLIN/SULBACTAM <=2 SENSITIVE Sensitive     PIP/TAZO <=4 SENSITIVE Sensitive     Extended ESBL NEGATIVE Sensitive     * >=100,000 COLONIES/mL ESCHERICHIA COLI  MRSA PCR Screening     Status: None   Collection Time: 09/21/2018 10:35 PM  Result Value Ref Range Status   MRSA by PCR NEGATIVE NEGATIVE Final    Comment:        The GeneXpert MRSA Assay (FDA approved for NASAL specimens only), is one component of a comprehensive MRSA colonization surveillance program. It is not intended to diagnose MRSA infection nor to guide or monitor treatment for MRSA infections. Performed at [facility], [place] [address], [place], [place] [address]   Culture, blood (routine x 2)     Status: None (Preliminary result)   Collection Time: 2018-10-19 12:06 AM  Result Value Ref Range Status   Specimen Description BLOOD LEFT ARM  Final   Special Requests   Final    BOTTLES DRAWN AEROBIC AND ANAEROBIC Blood Culture adequate volume   Culture   Final    NO GROWTH 1 DAY Performed at [facility], [place] [address] [place], [place] [address]    Report Status PENDING  Incomplete  Culture, blood (routine x 2)     Status: None (Preliminary result)   Collection Time: October 19, 2018 12:21 AM  Result Value Ref Range Status   Specimen Description BLOOD RIGHT ARM  Final   Special Requests   Final    BOTTLES DRAWN AEROBIC ONLY Blood Culture results may not be optimal due to an inadequate volume of blood received in culture bottles   Culture   Final    NO GROWTH 1 DAY Performed at [facility], [place] [address] [place],  [address]    Report Status PENDING  Incomplete    Lab Basic Metabolic Panel: Recent Labs  Lab 10/18/2018 1748 10/10/2018 1753  10/19/18 0021 10-19-2018 9937 10-19-2018 0528 Oct 19, 2018 0626 19-Oct-2018 0632  NA 142  --    < > 142 145 143 145 145  K 6.6*  --    < > 5.8* 5.1 5.9* 5.6* 5.7*  CL  112*  --   --  113*  --   --  116*  --   CO2 15*  --   --  14*  --   --  11*  --   GLUCOSE 144*  --   --  124*  --   --  140*  --   BUN 112*  --   --  111*  --   --  106*  --   CREATININE 3.11* 3.00*  --  2.96*  --   --  3.03*  --   CALCIUM 7.8*  --   --  7.1*  --   --  6.9*  --   MG  --   --   --  2.3  --   --  2.3  --   PHOS  --   --   --  4.2  --   --   --   --    < > = values in this interval not displayed.   Liver Function Tests: Recent Labs  Lab 09/19/2018 1748  AST 121*  ALT 199*  ALKPHOS 304*  BILITOT 2.8*  PROT 5.2*  ALBUMIN 1.2*   No results for input(s): LIPASE, AMYLASE in the last 168 hours. Recent Labs  Lab 10/13/18 0021 13-Oct-2018 0626  AMMONIA 66* 157*   CBC: Recent Labs  Lab 09/28/2018 1748 09/30/2018 1937 10/09/2018 2116 10-13-18 0333 October 13, 2018 0528 10/13/2018 0632  WBC 6.7  --   --  8.5  --   --   NEUTROABS 5.9  --   --  7.6  --   --   HGB 11.2*  --  9.5* 8.0*  11.9* 7.8* 7.1*  HCT 34.6*  --  28.0* 24.9*  35.0* 23.0* 21.0*  MCV 86.5  --   --  85.6  --   --   PLT 9* 12*  --  <5*  --   --    Cardiac Enzymes: Recent Labs  Lab October 13, 2018 0021 2018-10-13 0337  TROPONINI 0.10* 0.13*   Sepsis Labs: Recent Labs  Lab 09/28/2018 1748 10/10/2018 1937 13-Oct-2018 0021 Oct 13, 2018 0333  PROCALCITON  --   --  26.89  --   WBC 6.7  --   --  8.5  LATICACIDVEN  --  4.5*  --  5.2*    Procedures/Operations     Simonne Maffucci 10/06/2018, 9:25 AM

## 2018-10-19 NOTE — Progress Notes (Signed)
Responded to code blue. No additional access needed per team.

## 2018-10-19 NOTE — Progress Notes (Signed)
eLink Physician-Brief Progress Note Patient Name: Carrie Chaney DOB: 11/30/1940 MRN: [id number]   Date of Service  10-07-2018  HPI/Events of Note  ABG on 100%/PRVC 20/TV 500/P 5 = 7.117/31.1/170.0. Patient is already on a D5 NaHCO3 IV infusion.   eICU Interventions  Will order: 1. NaHCO3 100 meq IV now.  2. Repeat ABG at 7:30 AM.     Intervention Category Major Interventions: Respiratory failure - evaluation and management;Acid-Base disturbance - evaluation and management  Sommer,Steven Eugene 07-Oct-2018, 5:39 AM

## 2018-10-19 NOTE — Progress Notes (Signed)
Pt's time of death 80. Family at bedside. MD Simonne Maffucci and PA Rahul Shearon Stalls made aware.

## 2018-10-19 NOTE — Procedures (Signed)
Arterial Catheter Insertion Procedure Note Carrie Chaney [id number] 05/13/1941  Procedure: Insertion of Arterial Catheter  Indications: Blood pressure monitoring and Frequent blood sampling  Procedure Details Consent: Unable to obtain consent because of emergent medical necessity. Time Out: Verified patient identification, verified procedure, site/side was marked, verified correct patient position, special equipment/implants available, medications/allergies/relevent history reviewed, required imaging and test results available.  Performed  Maximum sterile technique was used including antiseptics, cap, gloves, gown, hand hygiene, mask and sheet. Skin prep: Chlorhexidine 20 gauge catheter was inserted into left radial artery using the Seldinger technique. ULTRASOUND GUIDANCE USED: YES Evaluation Blood flow good; BP tracing good. Complications: No apparent complications.  Kennieth Rad, MSN, AGACNP-BC [facility] Pgr: [phone] or if no answer [phone] 10/30/2018, 3:30 AM

## 2018-10-19 NOTE — Progress Notes (Signed)
CRITICAL VALUE ALERT  Critical Value:  Lactic acid 5.2  Date & Time Notied:  2018-10-16 0424  Provider Notified: Jennelle Human NP CCM  Orders Received/Actions taken: MD aware

## 2018-10-19 NOTE — Procedures (Signed)
Extubation Procedure Note  Patient Details:   Name: Carrie Chaney DOB: July 03, 1941 MRN: [id number]   Airway Documentation:    Vent end date: Oct 15, 2018 Vent end time: 0947   Evaluation   Pt extubated to RA per Withdrawal of Life Protocol  Jesse Sans 10-15-18, 9:48 AM

## 2018-10-19 NOTE — Procedures (Signed)
Central Venous Catheter Insertion Procedure Note  Procedure: Insertion of Central Venous Catheter (Trialysis HD catheter)  Indications:  vascular access, need for frequent blood draws, need for plasma exchange  Procedure Details  Informed consent was obtained from the patient's sister for the procedure, including sedation.  Risks of lung perforation, hemorrhage, arrhythmia, and adverse drug reaction were discussed.   Maximum sterile technique was used including antiseptics, cap, gloves, gown, hand hygiene, mask and sheet.  Under sterile conditions the skin above the at base of throat, on the left internal jugular vein was prepped with betadine and covered with a sterile drape. Local anesthesia was applied to the skin and subcutaneous tissues. A 22-gauge needle was used to identify the vein. An 18-gauge needle was then inserted into the vein. A guide wire was then passed easily through the catheter. There were occasional PVCs. The catheter was then withdrawn. A 13 [place] Trialysis HD catheter was then inserted into the vessel over the guide wire (depth 19 cm at skin surface). The catheter was sutured into place.  Findings: There were no changes to vital signs. Catheter was flushed with 10 cc NS. Patient did tolerate procedure well.  Recommendations: CXR ordered to verify placement.  Renee Pain, MD Board Certified by the ABIM, [place]

## 2018-10-19 NOTE — Progress Notes (Signed)
Pharmacy Antibiotic Note  Carrie Chaney is a 77 y.o. female with  VDRF and possible aspiration PNA.  Pharmacy has been consulted for  Unasyn dosing.  Plan: Unasyn 3 g IV q12h     Temp (24hrs), Avg:98.2 F (36.8 C), Min:97.8 F (36.6 C), Max:98.9 F (37.2 C)  Recent Labs  Lab 10/10/2018 1748 09/21/2018 1753 09/22/2018 1937 October 21, 2018 0021 2018-10-21 0333  WBC 6.7  --   --   --  8.5  CREATININE 3.11* 3.00*  --  2.96*  --   LATICACIDVEN  --   --  4.5*  --  5.2*    CrCl cannot be calculated (Unknown ideal weight.).    Allergies  Allergen Reactions  . Tomato Other (See Comments)    Breaks out gums    Caryl Pina Oct 21, 2018 5:02 AM

## 2018-10-19 NOTE — Progress Notes (Signed)
SLP Cancellation Note  Patient Details Name: Carrie Chaney MRN: [id number] DOB: July 27, 1940   Cancelled treatment:       Reason Eval/Treat Not Completed: Medical issues which prohibited therapy; pt now intubated. SLP to sign off. Please re consult as needed   Karyl Kinnier MA, [facility] 2018/10/21, 7:44 AM

## 2018-10-19 NOTE — Procedures (Signed)
Endotracheal Intubation Procedure Note  Indication for endotracheal intubation: respiratory failure and respiratory arrest. Airway Assessment: Mallampati Class: I (soft palate, uvula, fauces, and tonsillar pillars visible). Sedation: etomidate and midazolam. Paralytic: rocuronium. Lidocaine: no. Atropine: no. Equipment: Macintosh 4 laryngoscope blade and GlideScope Cricoid Pressure: no. Number of attempts: 1. ETT location confirmed by by auscultation and by CXR.  Renee Pain, MD Board Certified by the ABIM, [facility]  October 09, 2018

## 2018-10-19 NOTE — Progress Notes (Signed)
Noted change to comfort only care. Please call if neurology can be of further assistance.   Roland Rack, MD [facility] [phone]  If 7pm- 7am, please page neurology on call as listed in [place].

## 2018-10-19 NOTE — Progress Notes (Signed)
Oncology telephone note   I was informed about pt's admission this morning. I met pt during her last admission to [facility] last month. 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 She was not a candidate for chemo due to her poor PS and was discharged to SNF. I spoke with her sister Carrie Chaney on 3/14, updated her about her tumor genomic test FO results and she informed me that pt was getting worse lately in the SNF. The plan was to talk about transition her to comfort care and hospice when she comes in for f/u in my clinic, which is scheduled for today.   I have reviewed her chart, particularly her lab and image findings. Unfortunately she has developed bowel perforation, and septic shock, on MV and multiple pressors. I think her severe thromobocytopenia is likely related to underline sepsis and DIC, TTP is less likely. In any event, her prognosis is dismal, and death is probably inevitable if surgery is not performed for bowel perforation. I spoke with ICU team, and recommend comfort care, given her underline stage IV cancer and no treatment options for that. ICU team has spoken with her sisters and they have decided to withdraw her care and initiate comfort care.   I called her sister Carrie Chaney and supported her decision, emotional support given. She appreciated the call.  Carrie Chaney  09/22/2018  9:16 AM      

## 2018-10-19 NOTE — Consult Note (Signed)
[facility] Nurse wound consult note Reason for Consult: Multiple truncal pressure injuries. Patient is FTT with serious comorbid conditions including metastatic cancer.  Intubated, family is considering comfort care. Wound type:Pressure vs SCALE (Skin Changes at Life's End) Pressure Injury POA: Yes Measurement: Area measuring 14cm x 16cm with purple/red skin discolorations at coccygeal and sacral areas. Heels intact. Wound bed:See above Drainage (amount, consistency, odor) Serrosanguinous Periwound: petechiae Dressing procedure/placement/frequency: Patient is on a low air loss mattress, turning and repositioning is in place. Patient's skin is kept clean and dry.   [facility] nursing team will not follow, but will remain available to this patient, the nursing and medical teams.  Please re-consult if needed. Thanks, Maudie Flakes, MSN, RN, [place], [facility]  Pager# [phone]

## 2018-10-19 NOTE — Significant Event (Signed)
Met with the patient's sisters, who came in after being informed the patient had a respiratory arrest event earlier in the evening.  At the time of conversation, the patient has gone to CT radiology to have CT of the head abdomen/pelvis.  After the patient was intubated as part of resuscitative efforts for respiratory arrest, she was found to have anisocoria, raising suspicion for.  In light of her current coagulopathy and labile blood pressure, this raised concern for possibility of stroke.  Also, chest x-ray obtained earlier in the evening was suggestive of free air in the abdomen.  CT of the head showed no evidence of acute stroke; however, CT of the abdomen did reveal a sigmoid perforation.  The patient's sisters were provided updates on all of the interim events.  They are very understanding of the difficult situation and dismal prognosis of the patient.  While they are agreeable they are still wishing to proceed with plasma exchange treatment today, they also feel that it would be appropriate to change her CODE STATUS to DO NOT RESUSCITATE.  That is, they would like to proceed with definitive management; however, in the event of hemodynamic collapse that fails to respond to vasopressors/inotropes, malignant arrhythmia or cardiac arrest, no resuscitative efforts should be pursued.  They feel strongly that the patient would not be a reasonable surgical candidate and are not willing to proceed with surgical evaluation for bowel perforation.  In fact, the sisters have stipulated that they do not want surgical consultation at this time.  Renee Pain, MD Board Certified by the ABIM, [place]

## 2018-10-19 DEATH — deceased

## 2019-08-22 IMAGING — CT CT ABDOMEN AND PELVIS WITHOUT CONTRAST
2 of 6 series · 14 of 46 positions shown, 18 images · non-contrast
Comparison: Abdominal CT 09/08/2018

CLINICAL DATA: Rule out pneumoperitoneum

EXAM:
CT ABDOMEN AND PELVIS WITHOUT CONTRAST
TECHNIQUE: Multidetector CT imaging of the abdomen and pelvis was performed
following the standard protocol without IV contrast.

[Series 3: ap without · axial · non-contrast · 0.98mm/px · z∈[-781,-381]mm · 11 of 94 slices shown, 15 images]
[im 9/94  soft-tissue]
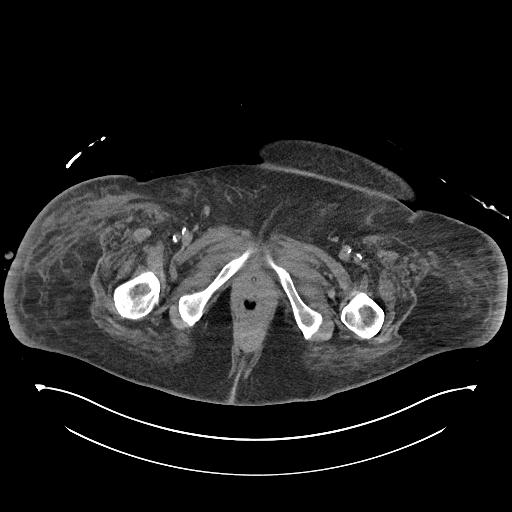
[im 9/94  bone]
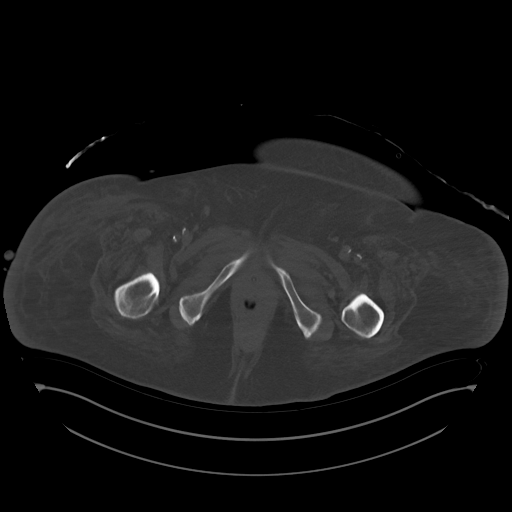
[im 18/94  soft-tissue]
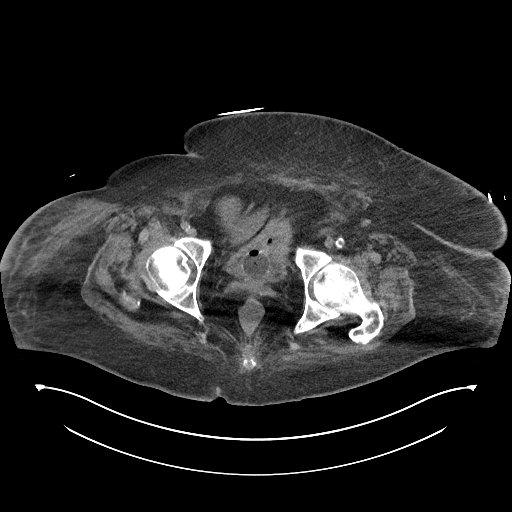
[im 27/94  soft-tissue]
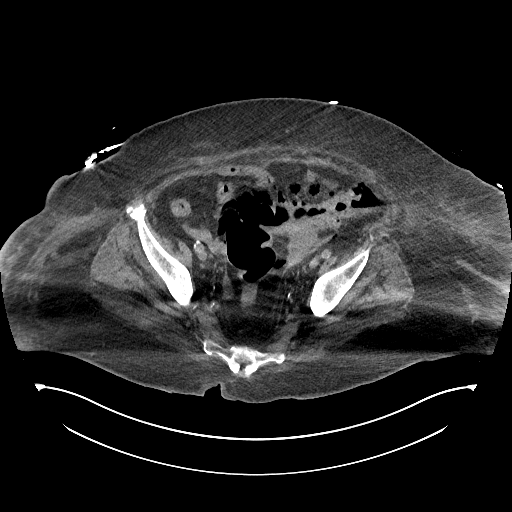
[im 36/94  soft-tissue]
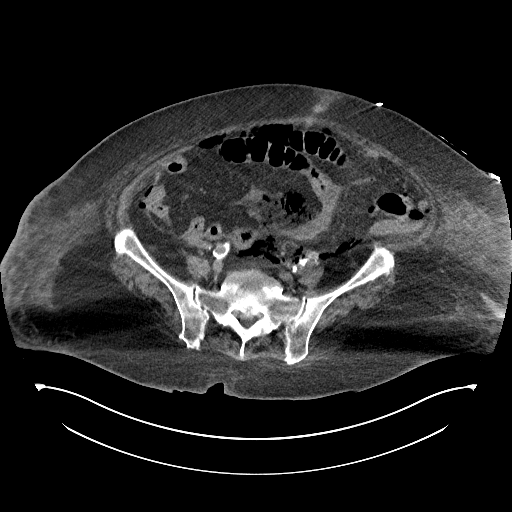
[im 49/94  soft-tissue]
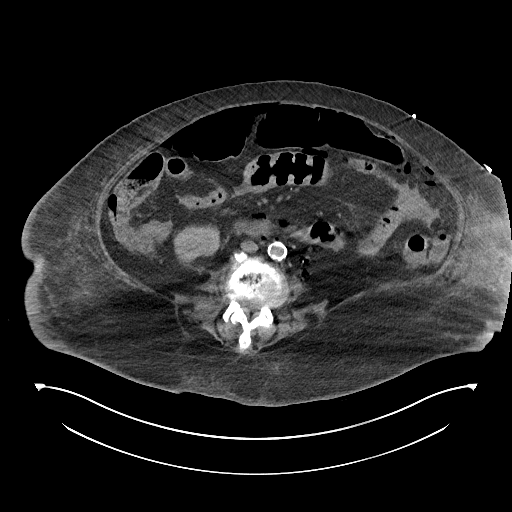
[im 58/94  soft-tissue]
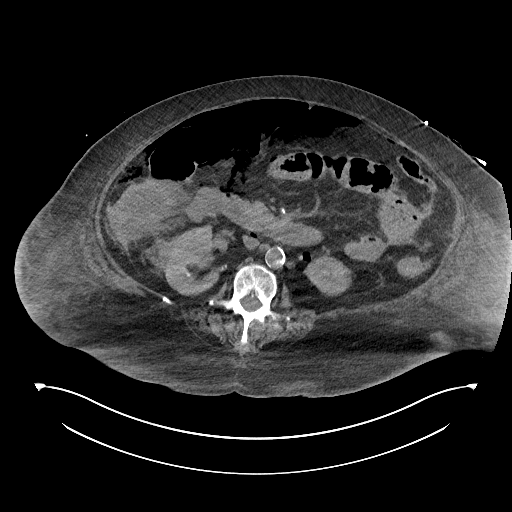
[im 67/94  soft-tissue]
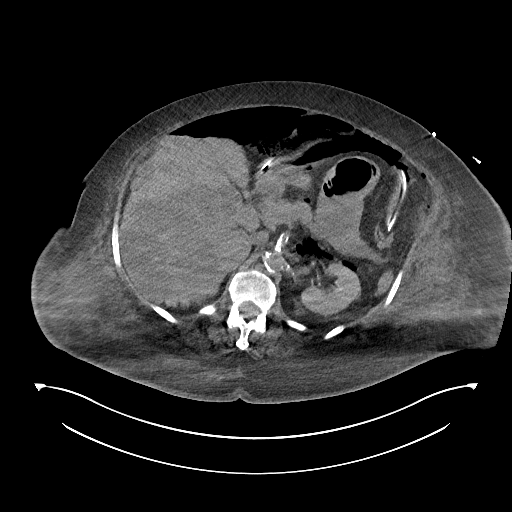
[im 76/94  soft-tissue]
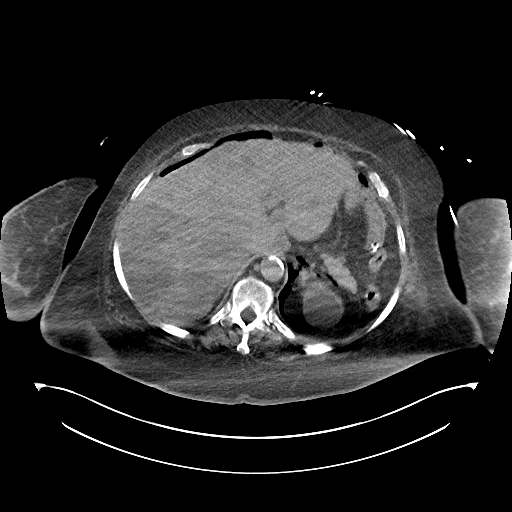
[im 76/94  lung]
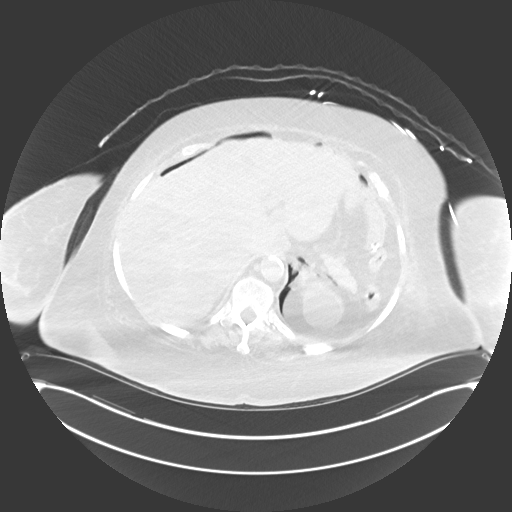
[im 80/94  lung]
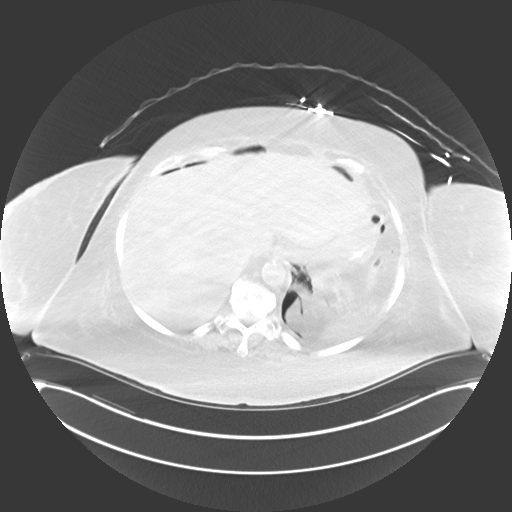
[im 85/94  soft-tissue]
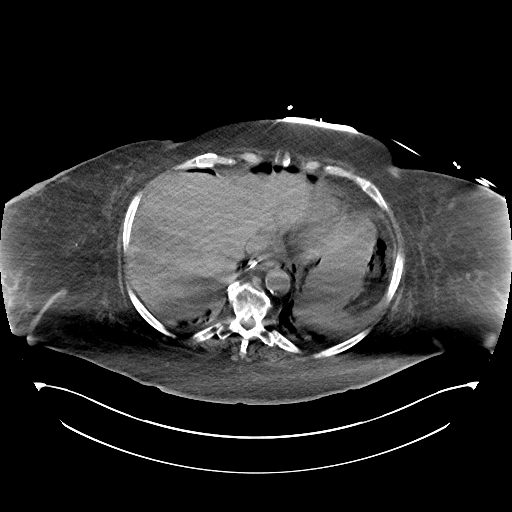
[im 85/94  lung]
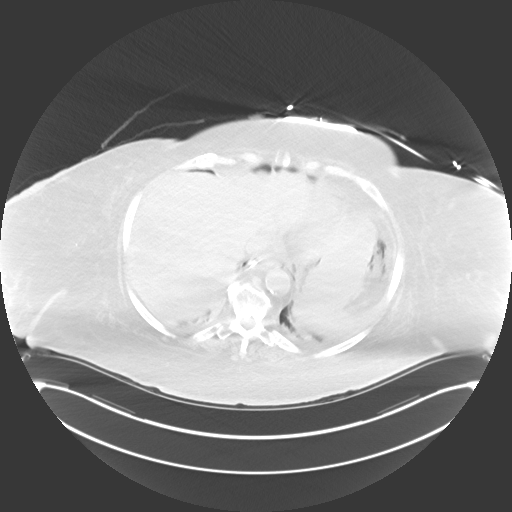
[im 85/94  bone]
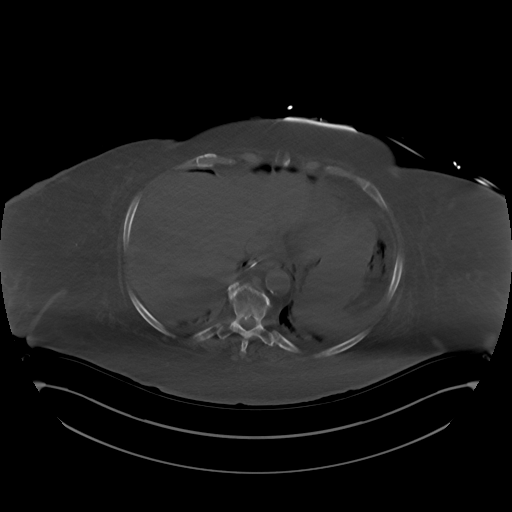
[im 89/94  lung]
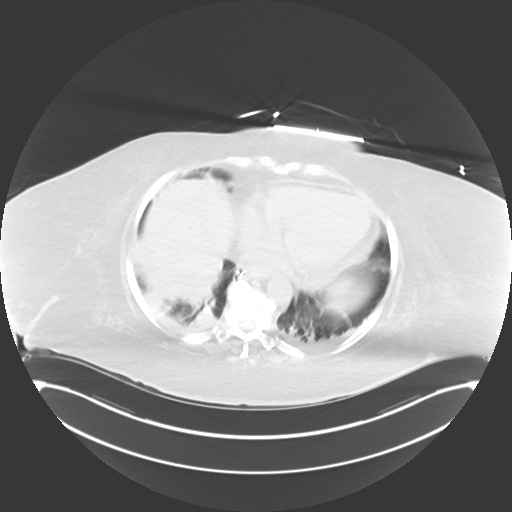

[Series 6: cor · coronal · 0.91mm/px · 3 of 110 slices shown]
[im 28/110  soft-tissue]
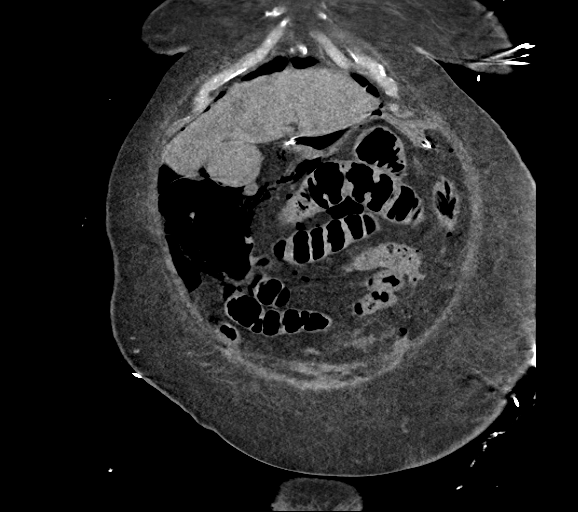
[im 55/110  soft-tissue]
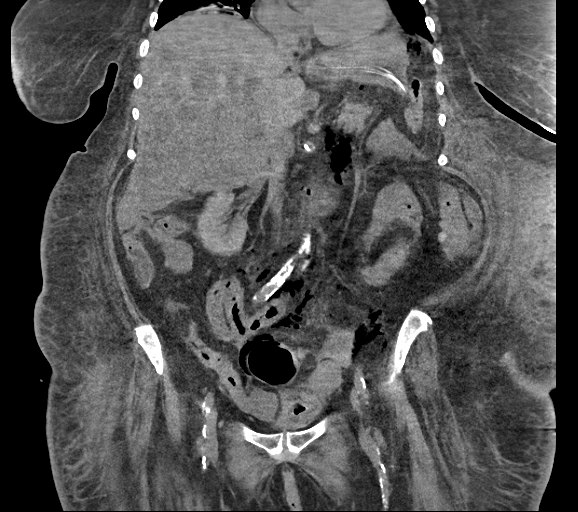
[im 82/110  soft-tissue]
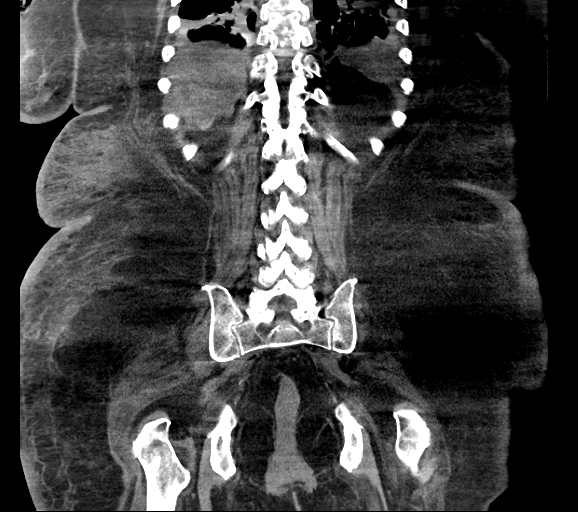

[14 of 46 positions shown; findings below may reference images not displayed]

FINDINGS: Lower chest:  Streaky opacity at the bases from atelectasis.

Hepatobiliary: Extensive metastatic disease, known. Small volume
adjacent ascites.No evidence of biliary obstruction or stone.

Pancreas: Unremarkable.

Spleen: Small spleen with adjacent ascites.

Adrenals/Urinary Tract: Negative adrenals. No hydronephrosis or
stone. Left renal cyst. The liver mass invades the superior pole
right kidney. The bladder is decompressed by Foley catheter

Stomach/Bowel: Extensive sigmoid mesenteric edema tracking in the
retroperitoneal spaces and also decompressing into the peritoneal
space with moderate pneumoperitoneum. There is a known mass along
the sigmoid colon measuring 5 cm. Negative for bowel obstruction.
Orogastric tube with tip in the stomach.

Vascular/Lymphatic: Extensive atherosclerotic calcification. No mass
or adenopathy.

Reproductive:No acute finding

Other: Trace ascites.  Pneumoperitoneum, as above

Musculoskeletal: Advanced degenerative disease of the lumbar spine.
No acute or aggressive finding.

Critical Value/emergent results were called by telephone at the time
of interpretation on 10/04/2018 at [DATE] to Dr. MALENY LORI , who
verbally acknowledged these results.
IMPRESSION: 1. Sigmoid colon perforation with extensive retroperitoneal and
intraperitoneal gas. Small volume ascites. This could complicated
diverticulitis or a complication of a known sigmoid colon mass which
has increased in size from 09/08/2018.
2. Known extensive malignancy in the liver with direct invasion of
the superior pole right kidney.
